# Patient Record
Sex: Male | Born: 1940 | Race: White | Hispanic: No | Smoking: Former smoker
Health system: Southern US, Community
[De-identification: ages and names within clinical notes are randomized; demographics above are authoritative.]

## PROBLEM LIST (undated history)

## (undated) DIAGNOSIS — F419 Anxiety disorder, unspecified: Secondary | ICD-10-CM

## (undated) DIAGNOSIS — J45909 Unspecified asthma, uncomplicated: Secondary | ICD-10-CM

## (undated) DIAGNOSIS — E669 Obesity, unspecified: Secondary | ICD-10-CM

## (undated) DIAGNOSIS — K219 Gastro-esophageal reflux disease without esophagitis: Secondary | ICD-10-CM

## (undated) DIAGNOSIS — K529 Noninfective gastroenteritis and colitis, unspecified: Secondary | ICD-10-CM

## (undated) DIAGNOSIS — I714 Abdominal aortic aneurysm, without rupture, unspecified: Secondary | ICD-10-CM

## (undated) DIAGNOSIS — Z8619 Personal history of other infectious and parasitic diseases: Secondary | ICD-10-CM

## (undated) DIAGNOSIS — H21561 Pupillary abnormality, right eye: Secondary | ICD-10-CM

## (undated) DIAGNOSIS — R0602 Shortness of breath: Secondary | ICD-10-CM

## (undated) DIAGNOSIS — A809 Acute poliomyelitis, unspecified: Secondary | ICD-10-CM

## (undated) DIAGNOSIS — I2510 Atherosclerotic heart disease of native coronary artery without angina pectoris: Secondary | ICD-10-CM

## (undated) DIAGNOSIS — I10 Essential (primary) hypertension: Secondary | ICD-10-CM

## (undated) DIAGNOSIS — T148XXA Other injury of unspecified body region, initial encounter: Secondary | ICD-10-CM

## (undated) DIAGNOSIS — M1990 Unspecified osteoarthritis, unspecified site: Secondary | ICD-10-CM

## (undated) DIAGNOSIS — E785 Hyperlipidemia, unspecified: Secondary | ICD-10-CM

## (undated) HISTORY — PX: CATARACT EXTRACTION: SUR2

## (undated) HISTORY — DX: Anxiety disorder, unspecified: F41.9

## (undated) HISTORY — PX: EYE SURGERY: SHX253

## (undated) HISTORY — DX: Obesity, unspecified: E66.9

## (undated) HISTORY — DX: Essential (primary) hypertension: I10

## (undated) HISTORY — DX: Unspecified asthma, uncomplicated: J45.909

## (undated) HISTORY — PX: KNEE ARTHROSCOPY: SHX127

## (undated) HISTORY — DX: Gastro-esophageal reflux disease without esophagitis: K21.9

## (undated) HISTORY — PX: NASAL SINUS SURGERY: SHX719

## (undated) HISTORY — DX: Noninfective gastroenteritis and colitis, unspecified: K52.9

## (undated) HISTORY — PX: CARDIAC CATHETERIZATION: SHX172

## (undated) HISTORY — DX: Atherosclerotic heart disease of native coronary artery without angina pectoris: I25.10

## (undated) HISTORY — DX: Hyperlipidemia, unspecified: E78.5

## (undated) HISTORY — PX: SHOULDER SURGERY: SHX246

## (undated) HISTORY — DX: Acute poliomyelitis, unspecified: A80.9

---

## 1999-01-16 ENCOUNTER — Encounter: Payer: Self-pay | Admitting: Cardiology

## 1999-01-16 ENCOUNTER — Ambulatory Visit (HOSPITAL_COMMUNITY): Admission: RE | Admit: 1999-01-16 | Discharge: 1999-01-16 | Payer: Self-pay | Admitting: Cardiology

## 1999-03-24 ENCOUNTER — Encounter: Admission: RE | Admit: 1999-03-24 | Discharge: 1999-06-22 | Payer: Self-pay | Admitting: Family Medicine

## 1999-06-15 ENCOUNTER — Ambulatory Visit (HOSPITAL_COMMUNITY): Admission: RE | Admit: 1999-06-15 | Discharge: 1999-06-15 | Payer: Self-pay | Admitting: Orthopedic Surgery

## 1999-06-15 ENCOUNTER — Encounter: Payer: Self-pay | Admitting: Orthopedic Surgery

## 1999-07-01 ENCOUNTER — Encounter: Payer: Self-pay | Admitting: Orthopedic Surgery

## 1999-07-08 ENCOUNTER — Ambulatory Visit (HOSPITAL_COMMUNITY): Admission: RE | Admit: 1999-07-08 | Discharge: 1999-07-08 | Payer: Self-pay | Admitting: Orthopedic Surgery

## 1999-07-08 ENCOUNTER — Encounter: Payer: Self-pay | Admitting: Orthopedic Surgery

## 2001-02-14 ENCOUNTER — Emergency Department (HOSPITAL_COMMUNITY): Admission: EM | Admit: 2001-02-14 | Discharge: 2001-02-15 | Payer: Self-pay | Admitting: Emergency Medicine

## 2002-10-29 ENCOUNTER — Encounter: Admission: RE | Admit: 2002-10-29 | Discharge: 2002-10-29 | Payer: Self-pay | Admitting: Pediatrics

## 2002-10-29 ENCOUNTER — Encounter: Payer: Self-pay | Admitting: Pediatrics

## 2003-08-10 ENCOUNTER — Emergency Department (HOSPITAL_COMMUNITY): Admission: AD | Admit: 2003-08-10 | Discharge: 2003-08-10 | Payer: Self-pay | Admitting: Family Medicine

## 2003-10-21 ENCOUNTER — Emergency Department (HOSPITAL_COMMUNITY): Admission: EM | Admit: 2003-10-21 | Discharge: 2003-10-21 | Payer: Self-pay | Admitting: Family Medicine

## 2003-11-13 ENCOUNTER — Ambulatory Visit (HOSPITAL_COMMUNITY): Admission: RE | Admit: 2003-11-13 | Discharge: 2003-11-13 | Payer: Self-pay | Admitting: Internal Medicine

## 2003-11-14 ENCOUNTER — Ambulatory Visit (HOSPITAL_COMMUNITY): Admission: RE | Admit: 2003-11-14 | Discharge: 2003-11-14 | Payer: Self-pay | Admitting: Internal Medicine

## 2004-01-25 ENCOUNTER — Emergency Department (HOSPITAL_COMMUNITY): Admission: EM | Admit: 2004-01-25 | Discharge: 2004-01-25 | Payer: Self-pay | Admitting: *Deleted

## 2004-03-09 ENCOUNTER — Encounter: Payer: Self-pay | Admitting: Internal Medicine

## 2004-08-26 ENCOUNTER — Ambulatory Visit: Payer: Self-pay | Admitting: Internal Medicine

## 2004-09-03 ENCOUNTER — Ambulatory Visit: Payer: Self-pay | Admitting: Internal Medicine

## 2004-09-08 ENCOUNTER — Ambulatory Visit: Payer: Self-pay | Admitting: Internal Medicine

## 2004-09-15 ENCOUNTER — Ambulatory Visit: Payer: Self-pay | Admitting: Internal Medicine

## 2004-09-25 ENCOUNTER — Ambulatory Visit: Payer: Self-pay | Admitting: Internal Medicine

## 2004-10-01 ENCOUNTER — Ambulatory Visit: Payer: Self-pay | Admitting: Internal Medicine

## 2004-10-07 ENCOUNTER — Ambulatory Visit: Payer: Self-pay | Admitting: Internal Medicine

## 2004-10-08 ENCOUNTER — Ambulatory Visit (HOSPITAL_COMMUNITY): Admission: RE | Admit: 2004-10-08 | Discharge: 2004-10-08 | Payer: Self-pay | Admitting: *Deleted

## 2004-10-16 ENCOUNTER — Ambulatory Visit: Payer: Self-pay | Admitting: Internal Medicine

## 2004-10-21 HISTORY — PX: CORONARY ARTERY BYPASS GRAFT: SHX141

## 2004-10-23 ENCOUNTER — Inpatient Hospital Stay (HOSPITAL_COMMUNITY): Admission: RE | Admit: 2004-10-23 | Discharge: 2004-10-30 | Payer: Self-pay | Admitting: Surgery

## 2004-11-30 ENCOUNTER — Encounter (HOSPITAL_COMMUNITY): Admission: RE | Admit: 2004-11-30 | Discharge: 2005-02-28 | Payer: Self-pay | Admitting: Cardiology

## 2004-12-22 ENCOUNTER — Ambulatory Visit: Payer: Self-pay | Admitting: Internal Medicine

## 2005-05-11 ENCOUNTER — Encounter: Admission: RE | Admit: 2005-05-11 | Discharge: 2005-05-11 | Payer: Self-pay | Admitting: Surgery

## 2005-06-16 ENCOUNTER — Ambulatory Visit: Payer: Self-pay | Admitting: Internal Medicine

## 2005-12-22 ENCOUNTER — Ambulatory Visit: Payer: Self-pay | Admitting: Internal Medicine

## 2006-03-01 ENCOUNTER — Encounter (INDEPENDENT_AMBULATORY_CARE_PROVIDER_SITE_OTHER): Payer: Self-pay | Admitting: Gastroenterology

## 2006-03-01 ENCOUNTER — Ambulatory Visit (HOSPITAL_COMMUNITY): Admission: RE | Admit: 2006-03-01 | Discharge: 2006-03-01 | Payer: Self-pay | Admitting: Gastroenterology

## 2006-06-22 ENCOUNTER — Ambulatory Visit: Payer: Self-pay | Admitting: Internal Medicine

## 2006-09-21 ENCOUNTER — Ambulatory Visit: Payer: Self-pay | Admitting: Internal Medicine

## 2006-11-03 ENCOUNTER — Ambulatory Visit: Payer: Self-pay | Admitting: Internal Medicine

## 2007-05-03 ENCOUNTER — Ambulatory Visit: Payer: Self-pay | Admitting: Internal Medicine

## 2007-07-03 ENCOUNTER — Ambulatory Visit: Payer: Self-pay | Admitting: Internal Medicine

## 2007-07-03 LAB — CONVERTED CEMR LAB: Theophylline Lvl: 0.5 ug/mL — ABNORMAL LOW (ref 10.0–20.0)

## 2007-08-23 ENCOUNTER — Telehealth (INDEPENDENT_AMBULATORY_CARE_PROVIDER_SITE_OTHER): Payer: Self-pay | Admitting: *Deleted

## 2007-08-31 ENCOUNTER — Telehealth (INDEPENDENT_AMBULATORY_CARE_PROVIDER_SITE_OTHER): Payer: Self-pay | Admitting: *Deleted

## 2007-10-20 ENCOUNTER — Emergency Department (HOSPITAL_COMMUNITY): Admission: EM | Admit: 2007-10-20 | Discharge: 2007-10-20 | Payer: Self-pay | Admitting: Emergency Medicine

## 2007-11-16 ENCOUNTER — Telehealth: Payer: Self-pay | Admitting: Internal Medicine

## 2007-12-19 ENCOUNTER — Telehealth (INDEPENDENT_AMBULATORY_CARE_PROVIDER_SITE_OTHER): Payer: Self-pay | Admitting: *Deleted

## 2008-01-01 ENCOUNTER — Ambulatory Visit: Payer: Self-pay | Admitting: Internal Medicine

## 2008-01-01 DIAGNOSIS — J3089 Other allergic rhinitis: Secondary | ICD-10-CM

## 2008-01-01 DIAGNOSIS — K219 Gastro-esophageal reflux disease without esophagitis: Secondary | ICD-10-CM | POA: Insufficient documentation

## 2008-01-01 DIAGNOSIS — J449 Chronic obstructive pulmonary disease, unspecified: Secondary | ICD-10-CM | POA: Insufficient documentation

## 2008-01-01 DIAGNOSIS — J302 Other seasonal allergic rhinitis: Secondary | ICD-10-CM | POA: Insufficient documentation

## 2008-01-01 DIAGNOSIS — J330 Polyp of nasal cavity: Secondary | ICD-10-CM | POA: Insufficient documentation

## 2008-02-01 ENCOUNTER — Encounter: Admission: RE | Admit: 2008-02-01 | Discharge: 2008-02-01 | Payer: Self-pay | Admitting: Internal Medicine

## 2008-02-26 ENCOUNTER — Ambulatory Visit: Payer: Self-pay | Admitting: Vascular Surgery

## 2008-06-06 ENCOUNTER — Ambulatory Visit: Payer: Self-pay | Admitting: Internal Medicine

## 2008-06-20 ENCOUNTER — Ambulatory Visit: Payer: Self-pay | Admitting: Internal Medicine

## 2008-07-04 ENCOUNTER — Ambulatory Visit: Payer: Self-pay | Admitting: Internal Medicine

## 2008-07-25 ENCOUNTER — Telehealth (INDEPENDENT_AMBULATORY_CARE_PROVIDER_SITE_OTHER): Payer: Self-pay | Admitting: *Deleted

## 2008-08-02 ENCOUNTER — Encounter: Payer: Self-pay | Admitting: Internal Medicine

## 2008-10-04 ENCOUNTER — Encounter: Payer: Self-pay | Admitting: Cardiology

## 2008-12-31 ENCOUNTER — Ambulatory Visit: Payer: Self-pay | Admitting: Internal Medicine

## 2008-12-31 LAB — CONVERTED CEMR LAB
Basophils Absolute: 0 10*3/uL (ref 0.0–0.1)
Eosinophils Relative: 10.3 % — ABNORMAL HIGH (ref 0.0–5.0)
HCT: 41.4 % (ref 39.0–52.0)
Lymphocytes Relative: 17.6 % (ref 12.0–46.0)
Lymphs Abs: 1.2 10*3/uL (ref 0.7–4.0)
Monocytes Relative: 9.2 % (ref 3.0–12.0)
Platelets: 188 10*3/uL (ref 150.0–400.0)
RDW: 13.7 % (ref 11.5–14.6)
WBC: 6.7 10*3/uL (ref 4.5–10.5)

## 2009-01-06 ENCOUNTER — Telehealth: Payer: Self-pay | Admitting: Internal Medicine

## 2009-01-06 LAB — CONVERTED CEMR LAB: IgE (Immunoglobulin E), Serum: 230.8 intl units/mL — ABNORMAL HIGH (ref 0.0–180.0)

## 2009-02-18 ENCOUNTER — Ambulatory Visit: Payer: Self-pay | Admitting: Internal Medicine

## 2009-03-04 ENCOUNTER — Ambulatory Visit: Payer: Self-pay | Admitting: Cardiology

## 2009-03-04 DIAGNOSIS — I119 Hypertensive heart disease without heart failure: Secondary | ICD-10-CM | POA: Insufficient documentation

## 2009-03-04 DIAGNOSIS — E785 Hyperlipidemia, unspecified: Secondary | ICD-10-CM | POA: Insufficient documentation

## 2009-03-11 ENCOUNTER — Ambulatory Visit: Payer: Self-pay | Admitting: Internal Medicine

## 2009-03-17 ENCOUNTER — Ambulatory Visit: Payer: Self-pay | Admitting: Internal Medicine

## 2009-03-21 ENCOUNTER — Telehealth: Payer: Self-pay | Admitting: Cardiology

## 2009-03-24 ENCOUNTER — Ambulatory Visit: Payer: Self-pay | Admitting: Internal Medicine

## 2009-03-25 ENCOUNTER — Telehealth (INDEPENDENT_AMBULATORY_CARE_PROVIDER_SITE_OTHER): Payer: Self-pay | Admitting: *Deleted

## 2009-03-27 ENCOUNTER — Ambulatory Visit: Payer: Self-pay | Admitting: Internal Medicine

## 2009-03-31 ENCOUNTER — Telehealth (INDEPENDENT_AMBULATORY_CARE_PROVIDER_SITE_OTHER): Payer: Self-pay | Admitting: *Deleted

## 2009-03-31 ENCOUNTER — Ambulatory Visit: Payer: Self-pay | Admitting: Internal Medicine

## 2009-04-03 ENCOUNTER — Ambulatory Visit: Payer: Self-pay | Admitting: Internal Medicine

## 2009-04-07 ENCOUNTER — Ambulatory Visit: Payer: Self-pay | Admitting: Internal Medicine

## 2009-04-10 ENCOUNTER — Ambulatory Visit: Payer: Self-pay | Admitting: Internal Medicine

## 2009-04-14 ENCOUNTER — Ambulatory Visit: Payer: Self-pay | Admitting: Internal Medicine

## 2009-04-14 ENCOUNTER — Telehealth (INDEPENDENT_AMBULATORY_CARE_PROVIDER_SITE_OTHER): Payer: Self-pay | Admitting: Radiology

## 2009-04-15 ENCOUNTER — Encounter: Payer: Self-pay | Admitting: Cardiovascular Disease

## 2009-04-15 ENCOUNTER — Ambulatory Visit: Payer: Self-pay

## 2009-04-17 ENCOUNTER — Ambulatory Visit: Payer: Self-pay | Admitting: Cardiology

## 2009-04-17 ENCOUNTER — Ambulatory Visit: Payer: Self-pay | Admitting: Internal Medicine

## 2009-04-18 ENCOUNTER — Ambulatory Visit: Payer: Self-pay | Admitting: Internal Medicine

## 2009-04-21 ENCOUNTER — Ambulatory Visit: Payer: Self-pay | Admitting: Internal Medicine

## 2009-04-22 LAB — CONVERTED CEMR LAB
ALT: 23 units/L (ref 0–53)
AST: 19 units/L (ref 0–37)
Albumin: 3.6 g/dL (ref 3.5–5.2)
Cholesterol: 167 mg/dL (ref 0–200)
HDL: 34.2 mg/dL — ABNORMAL LOW (ref >39.00)
Total Bilirubin: 1 mg/dL (ref 0.3–1.2)
Total Protein: 7.2 g/dL (ref 6.0–8.3)
Triglycerides: 87 mg/dL (ref 0.0–149.0)
VLDL: 17.4 mg/dL (ref 0.0–40.0)

## 2009-04-24 ENCOUNTER — Ambulatory Visit: Payer: Self-pay | Admitting: Internal Medicine

## 2009-05-01 ENCOUNTER — Ambulatory Visit: Payer: Self-pay | Admitting: Internal Medicine

## 2009-05-08 ENCOUNTER — Ambulatory Visit: Payer: Self-pay | Admitting: Internal Medicine

## 2009-05-09 ENCOUNTER — Ambulatory Visit: Payer: Self-pay | Admitting: Internal Medicine

## 2009-05-15 ENCOUNTER — Ambulatory Visit: Payer: Self-pay | Admitting: Internal Medicine

## 2009-05-22 ENCOUNTER — Ambulatory Visit: Payer: Self-pay | Admitting: Internal Medicine

## 2009-05-27 ENCOUNTER — Ambulatory Visit: Payer: Self-pay | Admitting: Internal Medicine

## 2009-05-28 ENCOUNTER — Ambulatory Visit: Payer: Self-pay | Admitting: Internal Medicine

## 2009-06-06 ENCOUNTER — Ambulatory Visit: Payer: Self-pay | Admitting: Internal Medicine

## 2009-06-10 ENCOUNTER — Ambulatory Visit: Payer: Self-pay | Admitting: Internal Medicine

## 2009-06-13 ENCOUNTER — Ambulatory Visit: Payer: Self-pay | Admitting: Internal Medicine

## 2009-06-14 ENCOUNTER — Emergency Department (HOSPITAL_COMMUNITY): Admission: EM | Admit: 2009-06-14 | Discharge: 2009-06-14 | Payer: Self-pay | Admitting: Emergency Medicine

## 2009-06-14 ENCOUNTER — Encounter (INDEPENDENT_AMBULATORY_CARE_PROVIDER_SITE_OTHER): Payer: Self-pay | Admitting: Emergency Medicine

## 2009-06-14 ENCOUNTER — Ambulatory Visit: Payer: Self-pay | Admitting: Vascular Surgery

## 2009-06-20 ENCOUNTER — Telehealth: Payer: Self-pay | Admitting: Internal Medicine

## 2009-06-24 ENCOUNTER — Ambulatory Visit: Payer: Self-pay | Admitting: Internal Medicine

## 2009-06-30 ENCOUNTER — Telehealth: Payer: Self-pay | Admitting: Internal Medicine

## 2009-07-01 ENCOUNTER — Telehealth (INDEPENDENT_AMBULATORY_CARE_PROVIDER_SITE_OTHER): Payer: Self-pay | Admitting: *Deleted

## 2009-07-01 ENCOUNTER — Ambulatory Visit: Payer: Self-pay | Admitting: Internal Medicine

## 2009-07-14 ENCOUNTER — Ambulatory Visit: Payer: Self-pay | Admitting: Internal Medicine

## 2009-07-18 ENCOUNTER — Ambulatory Visit: Payer: Self-pay | Admitting: Internal Medicine

## 2009-07-25 ENCOUNTER — Ambulatory Visit: Payer: Self-pay | Admitting: Internal Medicine

## 2009-08-04 ENCOUNTER — Ambulatory Visit: Payer: Self-pay | Admitting: Internal Medicine

## 2009-08-23 HISTORY — PX: LUMBAR DISC SURGERY: SHX700

## 2009-08-25 ENCOUNTER — Ambulatory Visit: Payer: Self-pay | Admitting: Internal Medicine

## 2009-08-28 ENCOUNTER — Ambulatory Visit: Payer: Self-pay | Admitting: Internal Medicine

## 2009-08-29 ENCOUNTER — Ambulatory Visit: Payer: Self-pay | Admitting: Internal Medicine

## 2009-09-08 ENCOUNTER — Ambulatory Visit: Payer: Self-pay | Admitting: Internal Medicine

## 2009-09-19 ENCOUNTER — Ambulatory Visit: Payer: Self-pay | Admitting: Internal Medicine

## 2009-10-07 ENCOUNTER — Ambulatory Visit: Payer: Self-pay | Admitting: Internal Medicine

## 2009-10-15 ENCOUNTER — Telehealth (INDEPENDENT_AMBULATORY_CARE_PROVIDER_SITE_OTHER): Payer: Self-pay | Admitting: *Deleted

## 2009-10-15 ENCOUNTER — Ambulatory Visit: Payer: Self-pay | Admitting: Internal Medicine

## 2009-10-20 ENCOUNTER — Ambulatory Visit: Payer: Self-pay | Admitting: Internal Medicine

## 2009-10-20 LAB — CONVERTED CEMR LAB
Calcium: 9.2 mg/dL (ref 8.4–10.5)
GFR calc non Af Amer: 88.71 mL/min (ref >60)
Glucose, Bld: 83 mg/dL (ref 70–99)
Potassium: 4.2 meq/L (ref 3.5–5.1)
Sodium: 141 meq/L (ref 135–145)

## 2009-11-03 ENCOUNTER — Ambulatory Visit: Payer: Self-pay | Admitting: Internal Medicine

## 2009-11-21 ENCOUNTER — Ambulatory Visit: Payer: Self-pay | Admitting: Internal Medicine

## 2009-12-02 ENCOUNTER — Ambulatory Visit: Payer: Self-pay | Admitting: Internal Medicine

## 2009-12-10 ENCOUNTER — Ambulatory Visit: Payer: Self-pay | Admitting: Internal Medicine

## 2009-12-24 ENCOUNTER — Ambulatory Visit: Payer: Self-pay | Admitting: Internal Medicine

## 2010-01-12 ENCOUNTER — Ambulatory Visit: Payer: Self-pay | Admitting: Internal Medicine

## 2010-01-21 ENCOUNTER — Ambulatory Visit: Payer: Self-pay | Admitting: Internal Medicine

## 2010-01-29 ENCOUNTER — Ambulatory Visit: Payer: Self-pay | Admitting: Internal Medicine

## 2010-02-12 ENCOUNTER — Ambulatory Visit: Payer: Self-pay | Admitting: Internal Medicine

## 2010-02-17 ENCOUNTER — Ambulatory Visit: Payer: Self-pay | Admitting: Internal Medicine

## 2010-02-21 ENCOUNTER — Encounter: Payer: Self-pay | Admitting: Internal Medicine

## 2010-02-26 ENCOUNTER — Ambulatory Visit: Payer: Self-pay | Admitting: Internal Medicine

## 2010-03-01 ENCOUNTER — Observation Stay (HOSPITAL_COMMUNITY): Admission: EM | Admit: 2010-03-01 | Discharge: 2010-03-02 | Payer: Self-pay | Admitting: Emergency Medicine

## 2010-03-01 ENCOUNTER — Ambulatory Visit: Payer: Self-pay | Admitting: Cardiology

## 2010-03-05 ENCOUNTER — Ambulatory Visit: Payer: Self-pay | Admitting: Internal Medicine

## 2010-03-23 ENCOUNTER — Ambulatory Visit: Payer: Self-pay | Admitting: Internal Medicine

## 2010-03-30 ENCOUNTER — Ambulatory Visit: Payer: Self-pay | Admitting: Internal Medicine

## 2010-04-13 ENCOUNTER — Ambulatory Visit: Payer: Self-pay | Admitting: Internal Medicine

## 2010-04-15 ENCOUNTER — Telehealth: Payer: Self-pay | Admitting: Cardiology

## 2010-05-11 ENCOUNTER — Emergency Department (HOSPITAL_COMMUNITY): Admission: EM | Admit: 2010-05-11 | Discharge: 2010-05-11 | Payer: Self-pay | Admitting: Emergency Medicine

## 2010-05-25 ENCOUNTER — Observation Stay (HOSPITAL_COMMUNITY): Admission: EM | Admit: 2010-05-25 | Discharge: 2010-05-25 | Payer: Self-pay | Admitting: Emergency Medicine

## 2010-06-17 ENCOUNTER — Ambulatory Visit (HOSPITAL_COMMUNITY): Admission: RE | Admit: 2010-06-17 | Discharge: 2010-06-18 | Payer: Self-pay | Admitting: Neurological Surgery

## 2010-09-22 NOTE — Assessment & Plan Note (Signed)
Summary: rov 4 months ///kp   Primary Provider/Referring Provider:  Kirby Funk  CC:  Follow up visit-asthma and allergies; no complaints since on dulera..  History of Present Illness: 05/21/09- Asthma, allergic rhinitis, GERD We reviewed together- CXR NAD, IgE elevated, Eos elevated. Allergy vaccine started. Theophylline making him nauseated- discussed splitting, taking after meals and  trying for different generic manufacturer. Still wheezing a lot at night.Most nights has to wake and use rescue inhaler. We discussed his experience with long acting bronchodilator. Out of Flovent past week.   October 20, 2009- Asthma, allergic rhinitis, GERD With [facility] he finds he is doing much better, needing rescue inhaler only rarely, where it had been twice daily. He had requested permission to give his own allergy shots because of cost. Otherwise he felt unable to continue this therapy. Eyes itching some x 2 weeks. We talked carefully about risk, safety, anaphyllaxis, policy and recommendation, Epipen. He indicated good understanding. He had rash twice with different theophylines and stopped them.  February 17, 2010- Asthma, allergic rhinitis, GERD Has reached maintenance on his allergy vaccine, continuing to get shots here with no problems. Reviewed his meds. He very much likes [facility] which he credits for improved stability. Has continued prednisone 5 mg every other day. Overall he is doing very much better than last year and we discussed getting off the prednisone.        Asthma History    Initial Asthma Severity Rating:    Age range: 12+ years    Symptoms: 0-2 days/week    Nighttime Awakenings: 0-2/month    Interferes w/ normal activity: no limitations    SABA use (not for EIB): 0-2 days/week    Asthma Severity Assessment: Intermittent   Preventive Screening-Counseling & Management  Alcohol-Tobacco     Smoking Status: quit     Year Quit: 1994  Current Medications (verified): 1)   Proair Hfa 108 (90 Base) Mcg/act Aers (Albuterol Sulfate) .... 2 Puffs Four Times A Day As Needed 2)  Fexofenadine Hcl 180 Mg  Tabs (Fexofenadine Hcl) .... Take 1 Tablet By Mouth Once A Day 3)  Ipratropium-Albuterol 0.5-2.5 (3) Mg/45ml Soln (Ipratropium-Albuterol) .... Use 1 Vial Four Times A Day As Needed 4)  Prednisone 5 Mg Tabs (Prednisone) .Marland Kitchen.. 1 By Mouth Once Every Other Day 5)  Dulera 100-5 Mcg/act Aero (Mometasone Furo-Formoterol Fum) .... 2 Puffs and Rinse, Twice Daily 6)  Allergy Vaccine 1:50 Go (W-E) 7)  Epipen 0.3 Mg/0.17ml Devi (Epinephrine) .... For Severe Allergic Reaction 8)  Zantac 150 Mg Tabs (Ranitidine Hcl) .... Take 1 Tablet By Mouth Once A Day As Needed 9)  Dipentum 250 Mg Caps (Olsalazine Sodium) .... Take 1 Capsule By Mouth Once A Day 10)  Aspir-Low 81 Mg Tbec (Aspirin) .... Take 1 Tablet By Mouth Once A Day 11)  Crestor 5 Mg Tabs (Rosuvastatin Calcium) .... Take 1 By Mouth On M,w, F  Allergies (verified): 1)  ! Codeine 2)  ! Pcn 3)  ! Sulfa 4)  ! Ibuprofen 5)  ! Asa 6)  ! Keflex 7)  ! * Statins 8)  ! * Advair 9)  ! * Singulair 10)  ! * Pravastatin  Past History:  Past Medical History: Last updated: 03/04/2009 1. Allergic Rhinitis 2. Allergic asthma: aspirin causes bronchospasm.  PFTs (6/10): FVC 78%, FEV1 67%, ratio 0.6, TLC 103%, DLCO 71%.  Mild obstructive defect improved with bronchodilators.  3. Polio- left leg 4. 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  EF 55-60% on cath at that time.  6. Hyperlipidemia 7. HTN 8. Prior tobacco use, quit 1994 9. Colitis 10.  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    Vital Signs:  Patient profile:   70 year old male Height:      68.5 inches Weight:      223 pounds BMI:     33.53 O2 Sat:      96 % on Room air Pulse rate:   78 / minute BP sitting:   138 / 82  (left arm) Cuff size:   regular  Vitals Entered By: Reynaldo Minium CMA (February 17, 2010 9:05 AM)  O2 Flow:  Room air CC: Follow up visit-asthma and allergies; no complaints since on dulera.   Physical Exam  Additional Exam:  General: A/Ox3; pleasant and cooperative, NAD, overweight SKIN: no rash, lesions NODES: no lymphadenopathy HEENT: [place]/AT, EOM- WNL, Conjuctivae- clear, chronic weakness/ droop Right eyelid, PERRLA, TM-WNL, Nose- clear, Throat- clear and wnl, dentures, Mallampati  III-IV NECK: Supple w/ fair ROM, JVD- none, normal carotid impulses w/o bruits Thyroid-, no stridor CHEST: clear but slow expiration, unlabored without rhonchi or dullness HEART: RRR, no m/g/r heard ABDOMEN: soft, over weight ZOX:WRUE, nl pulses, no edema  NEURO: Grossly intact to observation      Impression & Recommendations:  Problem # 1:  ASTHMA (ICD-493.90) Much  beter control of chronic obstructive asthma. We can continue present meds as discussed with him. Elwin Sleight is refilled He has continued prednisone every other day. He will drop  off now and see how he does.  Problem # 2:  ALLERGIC RHINITIS (ICD-477.9)  He is much more comfortable. We will continue allergy vaccine. Discussed advancing dose to 1:10. His updated medication list for this problem includes:    Fexofenadine Hcl 180 Mg Tabs (Fexofenadine hcl) .Marland Kitchen... 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 He continues to avoid aspirin.  Medications Added to Medication List This Visit: 1)  Allergy Vaccine 1:10 Go (w-e)  .... Advance with next order 2)  Crestor 5 Mg Tabs (Rosuvastatin calcium) .... Take 1 by mouth on m,w, f  Other Orders: Est. Patient Level IV (45409) Prescription Created Electronically [phone])  Patient Instructions: 1)  Please schedule a follow-up appointment in 4 months. 2)  I will have the allergy lab increase your vaccine strength next time it is refilled. 3)  Dulera 100/5 was refilled at your drug store Prescriptions: DULERA 100-5 MCG/ACT AERO (MOMETASONE FURO-FORMOTEROL FUM) 2 puffs and rinse, twice daily  #1 x prn   Entered and Authorized by:   Waymon Budge  MD   Signed by:   Waymon Budge MD on 02/17/2010   Method used:   Electronically to        [facility] [address] [phone]* (retail)       [address]       [place], [place]  [address]       Ph: [phone] or [phone]       Fax: [phone]   RxID:   [id number]

## 2010-09-22 NOTE — Miscellaneous (Signed)
Summary: Injection Record / [facility]    Injection Record / [facility]    Imported By: Lennie Odor 01/29/2010 17:21:47  _____________________________________________________________________  External Attachment:    Type:   Image     Comment:   External Document

## 2010-09-22 NOTE — Progress Notes (Signed)
Summary: ALLERGY  Phone Note Other Incoming   Caller: Pt.came in for all.shot. Details for Reason: Ins. increased his shots from $15 to $25 a wk.. Summary of Call: Thomas [name] came in today and said he is going to have to stop his allergy shots. He is currently building up on 1:50(.2 today). He said he would finish this set and then stop because he just can't afford $100.00 a month.($125 5 wk months) I know we normally discourage pt.'s giving their own shots. They seem to be helping him. His mind is sharp,he still works. Can we Make an acception? He won't be back til  next wk. just let me know whenever you get a chance. Initial call taken by: Thomas [name],  October 15, 2009 3:27 PM  Follow-up for Phone Call        I had talked with him about this before. We can train him to give his own shots, provide and teach epipen, sign waiver. I've changed the medication list to reflect his giving his own. Follow-up by: Waymon Budge MD,  October 16, 2009 12:02 PM    New/Updated Medications: * ALLERGY VACCINE 1:50 GO (W-E)

## 2010-09-22 NOTE — Miscellaneous (Signed)
Summary: Injection Record / [facility]    Injection Record / [facility]    Imported By: Lennie Odor 05/13/2010 11:43:22  _____________________________________________________________________  External Attachment:    Type:   Image     Comment:   External Document

## 2010-09-22 NOTE — Miscellaneous (Signed)
Summary: Rx/[facility]  Rx/[facility]   Imported By: Sherian Rein 03/17/2010 10:16:03  _____________________________________________________________________  External Attachment:    Type:   Image     Comment:   External Document

## 2010-09-22 NOTE — Progress Notes (Signed)
Summary: appointments for myoview and Dr Shirlee Latch  ---- Converted from flag ---- ---- 04/15/2010 1:01 PM, [employer], CMA wrote: Wanted to let you know that Mr. Thomas [name] has cancelled twice for his Nuclear study which was originally sched by Dr. Shirlee Latch for 03/09/10 after being seen on 03/01/10 in the ER. He did not resched. this last time which was suppoed to be 04/20/10.   Thanks, Burna Mortimer ------------------------------ I called and talked with pt today. He did not want to reschedule any appointments right now. He stated he was going out of town for about 6 weeks and would call and reschedule  when he returns.  He denied any chest pain or discomfort.

## 2010-09-22 NOTE — Miscellaneous (Signed)
Summary: Injection Orders / [facility]    Injection Orders / [facility]    Imported By: Lennie Odor 01/20/2010 15:41:52  _____________________________________________________________________  External Attachment:    Type:   Image     Comment:   External Document

## 2010-09-22 NOTE — Assessment & Plan Note (Signed)
Summary: 4 months/apc   Primary Provider/Referring Provider:  Dr. Sherin Quarry  CC:  4 month follow up.  states breathing has improved a lot and wheezing has resolved since starting dulera.  States he rarely needs to use provental now.  c/o eyes itching occasionally over the past 2 wks.  .  History of Present Illness: 03/11/09- Asthma, allergic rhinitis, GERD 4 days incr cough brown, without fever, sore throat or sick exposure. We reviewed his allergy skin test results from 2005 per his question  Pending cardiac stress test. Added Lisinopril for BP.We discussed cough, angioedema and hives as side effects to watch for. PFT- Mild obstruction with response to dilator, airtrapping, diffusion reduced. CXR IgE CBC  05/21/09- Asthma, allergic rhinitis, GERD We reviewed together- CXR NAD, IgE elevated, Eos elevated. Allergy vaccine started. Theophylline making him nauseated- discussed splitting, taking after meals and  trying for different generic manufacturer. Still wheezing a lot at night.Most nights has to wake and use rescue inhaler. We discussed his experience with long acting bronchodilator. Out of Flovent past week.   October 20, 2009- Asthma, allergic rhinitis, GERD With [facility] he finds he is doing much better, needing rescue inhaler only rarely, where it had been twice daily. He had requested permission to give his own allergy shots because of cost. Otherwise he felt unable to continue this therapy. Eyes itching some x 2 weeks. We talked carefully about risk, safety, anaphyllaxis, policy and recommendation, Epipen. He indicated good understanding. He had rash twice with different theophylines and stopped them.   Current Medications (verified): 1)  Proventil Hfa 108 (90 Base) Mcg/act Aers (Albuterol Sulfate) .... 2 Puffs Four Times A Day As Needed 2)  Fexofenadine Hcl 180 Mg  Tabs (Fexofenadine Hcl) .... Take 1 Tablet By Mouth Once A Day 3)  Ipratropium-Albuterol 0.5-2.5 (3) Mg/59ml Soln  (Ipratropium-Albuterol) .... Use 1 Vial Four Times A Day As Needed 4)  Prednisone 5 Mg Tabs (Prednisone) .Marland Kitchen.. 1 By Mouth Once Daily 5)  Allergy Vaccine 1:50 Go (W-E) 6)  Zantac 150 Mg Tabs (Ranitidine Hcl) .... Take 1 Tablet By Mouth Once A Day As Needed 7)  Dipentum 250 Mg Caps (Olsalazine Sodium) .... Take 1 Capsule By Mouth Once A Day 8)  Aspir-Low 81 Mg Tbec (Aspirin) .... Take 1 Tablet By Mouth Once A Day 9)  Dulera 200-5 Mcg/act Aero (Mometasone Furo-Formoterol Fum) .... 2 Puffs and Rinse Twice Daily  Allergies (verified): 1)  ! Codeine 2)  ! Pcn 3)  ! Sulfa 4)  ! Ibuprofen 5)  ! Asa 6)  ! Keflex 7)  ! * Statins 8)  ! * Advair 9)  ! * Singulair 10)  ! * Pravastatin  Past History:  Past Medical History: Last updated: 03/04/2009 1. Allergic Rhinitis 2. Allergic asthma: aspirin causes bronchospasm.  PFTs (6/10): FVC 78%, FEV1 67%, ratio 0.6, TLC 103%, DLCO 71%.  Mild obstructive defect improved with bronchodilators.  3. Polio- left leg 4. 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  EF 55-60% on cath at that time.  6. Hyperlipidemia 7. HTN 8. Prior tobacco use, quit 1994 9. Colitis 10.  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    Vital Signs:  Patient profile:   70 year old male Height:      68.5 inches Weight:      224.13 pounds BMI:     33.70 O2 Sat:      96 % on Room air Pulse rate:   63 / minute BP sitting:   134 / 86  (left arm) Cuff size:   regular  Vitals Entered By: Gweneth Dimitri RN (October 20, 2009 9:36 AM)  O2 Flow:  Room air CC: 4 month follow up.  states breathing has improved a lot and wheezing has resolved since starting dulera.  States he rarely needs to use provental now.  c/o eyes itching occasionally over the past 2 wks.   Comments Medications reviewed with patient Daytime contact number verified with patient. Gweneth Dimitri RN  October 20, 2009 9:36 AM    Physical Exam  Additional Exam:  General: A/Ox3; pleasant and cooperative, NAD, overweight SKIN: no rash, lesions NODES: no lymphadenopathy HEENT: [place]/AT, EOM- WNL, Conjuctivae- clear, chronic weakness/ droop Right eyelid, PERRLA, TM-WNL, Nose- clear, Throat- clear and wnl NECK: Supple w/ fair ROM, JVD- none, normal carotid impulses w/o bruits Thyroid- CHEST: clear but slow expiration, unlabored without rhonchi or dullness HEART: RRR, no m/g/r heard ABDOMEN: soft [id number], nl pulses, no edema  NEURO: Grossly intact to observation      Impression & Recommendations:  Problem # 1:  ALLERGIC RHINITIS (ICD-477.9)  After careful discussion, he is being given permission to give his own allergy shots as discussed. His updated medication list for this problem includes:    Fexofenadine Hcl 180 Mg Tabs (Fexofenadine hcl) .Marland Kitchen... Take 1 tablet by mouth once a day  Problem # 2:  ASTHMA (ICD-493.90) We will see if Dulera 100/5 is sufficient. Reduce prednisone to every other day.  Medications Added to Medication List This Visit: 1)  Prednisone 5 Mg Tabs (Prednisone) .Marland Kitchen.. 1 by mouth once every  other day 2)  Dulera 100-5 Mcg/act Aero (Mometasone furo-formoterol fum) .... 2 puffs and rinse, twice daily 3)  Epipen 0.3 Mg/0.48ml Devi (Epinephrine) .... For severe allergic reaction 4)  Zantac 150 Mg Tabs (Ranitidine hcl) .... Take 1 tablet by mouth once a day as needed  Other Orders: Est. Patient Level III (47829) TLB-BMP (Basic Metabolic Panel-BMET) (80048-METABOL)  Patient Instructions: 1)  Please schedule a follow-up appointment in 4 months. 2)  Sample and script to change Dulera to 100/5 strength. if it doesn't hold you well, we will go back to the 200/5 strength. 3)  For leg cramps, try eating more potassium rich foods- bannanas ,tomatoes, orange juice, spinach. Stretch a little before bed. 4)  Script for epipen to keep near allergy supplies in case of severe allergic reaction 5)  Lab Prescriptions: PREDNISONE 5 MG TABS (PREDNISONE) 1 by mouth once every other day  #30 x prn   Entered and Authorized by:   Waymon Budge MD   Signed by:   Waymon Budge MD on 10/20/2009   Method used:   Print then Give to Patient   RxID:  [id number] EPIPEN 0.3 MG/0.3ML DEVI (EPINEPHRINE) For severe allergic reaction  #1 x prn   Entered and Authorized by:   Waymon Budge MD   Signed by:   Waymon Budge MD on 10/20/2009   Method used:   Print then Give to Patient   RxID:   [id number] DULERA 100-5 MCG/ACT AERO (MOMETASONE FURO-FORMOTEROL FUM) 2 puffs and rinse, twice daily  #1 x prn   Entered and Authorized by:   Waymon Budge MD   Signed by:   Waymon Budge MD on 10/20/2009   Method used:   Print then Give to Patient   RxID:   [id number]    Immunization History:  Pneumovax Immunization History:    Pneumovax:  historical (05/24/2007)

## 2010-10-22 ENCOUNTER — Ambulatory Visit (INDEPENDENT_AMBULATORY_CARE_PROVIDER_SITE_OTHER): Payer: Medicare Other | Admitting: Internal Medicine

## 2010-10-22 ENCOUNTER — Encounter: Payer: Self-pay | Admitting: Internal Medicine

## 2010-10-22 DIAGNOSIS — J309 Allergic rhinitis, unspecified: Secondary | ICD-10-CM

## 2010-10-22 DIAGNOSIS — J45909 Unspecified asthma, uncomplicated: Secondary | ICD-10-CM

## 2010-10-29 NOTE — Assessment & Plan Note (Signed)
Summary: rov ///kp   Primary Provider/Referring Provider:  Kirby Funk  CC:  Follow up visit-discuss allergy vaccine.Marland Kitchen  History of Present Illness: October 20, 2009- Asthma, allergic rhinitis, GERD With [facility] he finds he is doing much better, needing rescue inhaler only rarely, where it had been twice daily. He had requested permission to give his own allergy shots because of cost. Otherwise he felt unable to continue this therapy. Eyes itching some x 2 weeks. We talked carefully about risk, safety, anaphyllaxis, policy and recommendation, Epipen. He indicated good understanding. He had rash twice with different theophylines and stopped them.  February 17, 2010- Asthma, allergic rhinitis, GERD Has reached maintenance on his allergy vaccine, continuing to get shots here with no problems. Reviewed his meds. He very much likes [facility] which he credits for improved stability. Has continued prednisone 5 mg every other day. Overall he is doing very much better than last year and we discussed getting off the prednisone.  October 22, 2010- Asthma, allergic rhinitis, GERD Nurse-CC: Follow up visit-discuss allergy vaccine. Hosp cardiology 03/02/10- for noncardiac pain in context of diffuse CAD. Had lumbar disk surgery in October with prolonged bed rest so he dropped allergy shots. He wants to see how he does now off allergy vaccine. Daily Allegra 180. Now 2-3 days increased cough with green sputum, no fever or sore throat.        Asthma History    Asthma Control Assessment:    Age range: 12+ years    Symptoms: 0-2 days/week    Nighttime Awakenings: 0-2/month    Interferes w/ normal activity: no limitations    SABA use (not for EIB): 0-2 days/week    Asthma Control Assessment: Well Controlled   Preventive Screening-Counseling & Management  Alcohol-Tobacco     Smoking Status: quit     Year Quit: 1994  Current Medications (verified): 1)  Proair Hfa 108 (90 Base) Mcg/act Aers (Albuterol  Sulfate) .... 2 Puffs Four Times A Day As Needed 2)  Fexofenadine Hcl 180 Mg  Tabs (Fexofenadine Hcl) .... Take 1 Tablet By Mouth Once A Day 3)  Ipratropium-Albuterol 0.5-2.5 (3) Mg/71ml Soln (Ipratropium-Albuterol) .... Use 1 Vial Four Times A Day As Needed 4)  Dulera 100-5 Mcg/act Aero (Mometasone Furo-Formoterol Fum) .... 2 Puffs and Rinse, Twice Daily 5)  Allergy Vaccine 1:10 Go (W-E) .... Advance With Next Order 6)  Tagamet Hb 200 Mg Tabs (Cimetidine) .... As Needed 7)  Dipentum 250 Mg Caps (Olsalazine Sodium) .... Take 1 Capsule By Mouth Once A Day 8)  Aspir-Low 81 Mg Tbec (Aspirin) .... Take 1 Tablet By Mouth Once A Day 9)  Crestor 5 Mg Tabs (Rosuvastatin Calcium) .... Take 1 By Mouth On M,w, F  Allergies (verified): 1)  ! Codeine 2)  ! Pcn 3)  ! Sulfa 4)  ! Ibuprofen 5)  ! Asa 6)  ! Keflex 7)  ! * Statins 8)  ! * Advair 9)  ! * Singulair 10)  ! * Pravastatin  Past History:  Past Medical History: Last updated: 03/04/2009 1. Allergic Rhinitis 2. Allergic asthma: aspirin causes bronchospasm.  PFTs (6/10): FVC 78%, FEV1 67%, ratio 0.6, TLC 103%, DLCO 71%.  Mild obstructive defect improved with bronchodilators.  3. Polio- left leg 4. 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  EF 55-60% on cath at that time.  6. Hyperlipidemia 7. HTN 8. Prior tobacco use, quit 1994 9. Colitis 10.  Obesity  Family History: Last updated: 12/31/2008 Father-  died cerebral hemorrhage Mother died Alzheimers Sister- died- liver failure Sister- died cerebral hemorrhage sister died cerebral hemorrhage broter- died lung cancer- smoked  Social History: Last updated: 03/04/2009 Patient states former smoker-1994 Married, lives in [place] Worked- [employer], now drives shuttle for [employer]  Risk Factors: Smoking Status: quit (10/22/2010)  Past Surgical History: C A B G: Arthroscopy right k nee Surgery right eye- cataract, rebult eye  socket Sinus surgery Lumbar disk 2011  Review of Systems      See HPI       The patient complains of productive cough and nasal congestion/difficulty breathing through nose.  The patient denies shortness of breath with activity, shortness of breath at rest, non-productive cough, coughing up blood, chest pain, irregular heartbeats, acid heartburn, indigestion, loss of appetite, weight change, abdominal pain, difficulty swallowing, sore throat, tooth/dental problems, headaches, and sneezing.    Vital Signs:  Patient profile:   70 year old male Height:      68.5 inches Weight:      217.50 pounds BMI:     32.71 O2 Sat:      95 % on Room air Pulse rate:   78 / minute BP sitting:   142 / 92  (right arm) Cuff size:   regular  Vitals Entered By: Reynaldo Minium CMA (October 22, 2010 4:18 PM)  O2 Flow:  Room air CC: Follow up visit-discuss allergy vaccine.   Physical Exam  Additional Exam:  General: A/Ox3; pleasant and cooperative, NAD, overweight SKIN: no rash, lesions NODES: no lymphadenopathy HEENT: [place]/AT, EOM- WNL, Conjuctivae- clear, chronic weakness/ droop Right eyelid, PERRLA, TM-WNL, Nose- clear, Throat- clear and wnl, dentures, Mallampati  III-IV NECK: Supple w/ fair ROM, JVD- none, normal carotid impulses w/o bruits Thyroid-, no stridor CHEST: bibasilar wheezes, unlabored.  HEART: RRR, no m/g/r heard ABDOMEN: soft, over weight ZOX:WRUE, nl pulses, no edema  NEURO: Grossly intact to observation      Impression & Recommendations:  Problem # 1:  ASTHMA (ICD-493.90) He credits Dulera with stopping waking from asthma. He asks about doing without it and we discussed doing that cautiously.  He has an acute bronchitis now and with discussion is given Z pak.   Problem # 2:  ALLERGIC RHINITIS (ICD-477.9)  We will watch now off allergy vaccine and he will use the fexofenadine as discussed.  His updated medication list for this problem includes:    Fexofenadine Hcl 180 Mg Tabs  (Fexofenadine hcl) .Marland Kitchen... Take 1 tablet by mouth once a day  Medications Added to Medication List This Visit: 1)  Tagamet Hb 200 Mg Tabs (Cimetidine) .... As needed 2)  Zithromax Z-pak 250 Mg Tabs (Azithromycin) .... 2 today then one daily  Other Orders: Est. Patient Level III (45409)  Patient Instructions: 1)  Please schedule a follow-up appointment in 4 months. 2)  Refill scripts for neb solution and Proair printed 3)  Ok to stay off allergy  vaccine and see how you do 4)  Script for Z pak Prescriptions: ZITHROMAX Z-PAK 250 MG TABS (AZITHROMYCIN) 2 today then one daily  #1 pak x 0   Entered and Authorized by:   Waymon Budge MD   Signed by:   Waymon Budge MD on 10/22/2010   Method used:   Print then Give to Patient   RxID:   [id number] IPRATROPIUM-ALBUTEROL 0.5-2.5 (3) MG/3ML SOLN (IPRATROPIUM-ALBUTEROL) Use 1 vial four times a day as needed  #50 x prn   Entered and Authorized  by:   Waymon Budge MD   Signed by:   Waymon Budge MD on 10/22/2010   Method used:   Print then Give to Patient   RxID:   [id number] PROAIR HFA 108 (90 BASE) MCG/ACT AERS (ALBUTEROL SULFATE) 2 puffs four times a day as needed  #8.5 Inhaler x prn   Entered and Authorized by:   Waymon Budge MD   Signed by:   Waymon Budge MD on 10/22/2010   Method used:   Print then Give to Patient   RxID:   [id number]

## 2010-11-04 LAB — CBC
HCT: 41.8 % (ref 39.0–52.0)
HCT: 42.2 % (ref 39.0–52.0)
Hemoglobin: 14.6 g/dL (ref 13.0–17.0)
Hemoglobin: 14.7 g/dL (ref 13.0–17.0)
MCHC: 34.9 g/dL (ref 30.0–36.0)
MCV: 88.9 fL (ref 78.0–100.0)
RBC: 4.69 MIL/uL (ref 4.22–5.81)
RDW: 13.5 % (ref 11.5–15.5)

## 2010-11-04 LAB — BASIC METABOLIC PANEL
BUN: 10 mg/dL (ref 6–23)
CO2: 24 mEq/L (ref 19–32)
Calcium: 8.8 mg/dL (ref 8.4–10.5)
Chloride: 108 mEq/L (ref 96–112)
GFR calc Af Amer: >60 mL/min (ref >60)
GFR calc Af Amer: >60 mL/min (ref >60)
GFR calc non Af Amer: >60 mL/min (ref >60)
GFR calc non Af Amer: >60 mL/min (ref >60)
Glucose, Bld: 135 mg/dL — ABNORMAL HIGH (ref 70–99)
Potassium: 3.8 mEq/L (ref 3.5–5.1)
Potassium: 4.4 mEq/L (ref 3.5–5.1)
Sodium: 138 mEq/L (ref 135–145)
Sodium: 140 mEq/L (ref 135–145)

## 2010-11-04 LAB — DIFFERENTIAL
Basophils Absolute: 0 10*3/uL (ref 0.0–0.1)
Basophils Relative: 1 % (ref 0–1)
Eosinophils Relative: 8 % — ABNORMAL HIGH (ref 0–5)
Lymphocytes Relative: 9 % — ABNORMAL LOW (ref 12–46)
Lymphs Abs: 0.8 10*3/uL (ref 0.7–4.0)
Monocytes Absolute: 0.2 10*3/uL (ref 0.1–1.0)
Monocytes Absolute: 1.1 10*3/uL — ABNORMAL HIGH (ref 0.1–1.0)
Monocytes Relative: 14 % — ABNORMAL HIGH (ref 3–12)
Monocytes Relative: 2 % — ABNORMAL LOW (ref 3–12)
Neutro Abs: 4.4 10*3/uL (ref 1.7–7.7)
Neutro Abs: 8.6 10*3/uL — ABNORMAL HIGH (ref 1.7–7.7)
Neutrophils Relative %: 89 % — ABNORMAL HIGH (ref 43–77)

## 2010-11-04 LAB — SURGICAL PCR SCREEN: Staphylococcus aureus: POSITIVE — AB

## 2010-11-04 LAB — PROTIME-INR
INR: 1 (ref 0.00–1.49)
Prothrombin Time: 13.4 seconds (ref 11.6–15.2)

## 2010-11-04 LAB — APTT
aPTT: 27 seconds (ref 24–37)
aPTT: 31 seconds (ref 24–37)

## 2010-11-05 LAB — DIFFERENTIAL
Basophils Relative: 0 % (ref 0–1)
Lymphs Abs: 1.1 10*3/uL (ref 0.7–4.0)
Monocytes Relative: 6 % (ref 3–12)
Neutro Abs: 5.7 10*3/uL (ref 1.7–7.7)
Neutrophils Relative %: 75 % (ref 43–77)

## 2010-11-05 LAB — COMPREHENSIVE METABOLIC PANEL
Alkaline Phosphatase: 57 U/L (ref 39–117)
BUN: 13 mg/dL (ref 6–23)
Calcium: 9 mg/dL (ref 8.4–10.5)
GFR calc Af Amer: >60 mL/min (ref >60)
GFR calc non Af Amer: >60 mL/min (ref >60)
Glucose, Bld: 118 mg/dL — ABNORMAL HIGH (ref 70–99)
Total Protein: 7.4 g/dL (ref 6.0–8.3)

## 2010-11-05 LAB — CBC
Hemoglobin: 14.6 g/dL (ref 13.0–17.0)
MCH: 32.5 pg (ref 26.0–34.0)
Platelets: 208 10*3/uL (ref 150–400)
RBC: 4.48 MIL/uL (ref 4.22–5.81)
WBC: 7.6 10*3/uL (ref 4.0–10.5)

## 2010-11-08 LAB — HEMOCCULT GUIAC POC 1CARD (OFFICE): Fecal Occult Bld: NEGATIVE

## 2010-11-08 LAB — AMYLASE
Amylase: 44 U/L (ref 0–105)
Amylase: 48 U/L (ref 0–105)

## 2010-11-08 LAB — CK TOTAL AND CKMB (NOT AT ARMC)
CK, MB: 2.4 ng/mL (ref 0.3–4.0)
Relative Index: INVALID (ref 0.0–2.5)
Total CK: 96 U/L (ref 7–232)

## 2010-11-08 LAB — COMPREHENSIVE METABOLIC PANEL
ALT: 34 U/L (ref 0–53)
AST: 56 U/L — ABNORMAL HIGH (ref 0–37)
Albumin: 3.2 g/dL — ABNORMAL LOW (ref 3.5–5.2)
Albumin: 3.7 g/dL (ref 3.5–5.2)
Alkaline Phosphatase: 49 U/L (ref 39–117)
Alkaline Phosphatase: 66 U/L (ref 39–117)
BUN: 11 mg/dL (ref 6–23)
Calcium: 8.5 mg/dL (ref 8.4–10.5)
Calcium: 9.3 mg/dL (ref 8.4–10.5)
GFR calc Af Amer: >60 mL/min (ref >60)
Glucose, Bld: 123 mg/dL — ABNORMAL HIGH (ref 70–99)
Glucose, Bld: 98 mg/dL (ref 70–99)
Potassium: 4 mEq/L (ref 3.5–5.1)
Potassium: 4 mEq/L (ref 3.5–5.1)
Sodium: 140 mEq/L (ref 135–145)
Total Protein: 6.3 g/dL (ref 6.0–8.3)
Total Protein: 6.9 g/dL (ref 6.0–8.3)

## 2010-11-08 LAB — CARDIAC PANEL(CRET KIN+CKTOT+MB+TROPI)
CK, MB: 1.9 ng/mL (ref 0.3–4.0)
CK, MB: 2.2 ng/mL (ref 0.3–4.0)
CK, MB: 2.3 ng/mL (ref 0.3–4.0)
Relative Index: INVALID (ref 0.0–2.5)
Relative Index: INVALID (ref 0.0–2.5)
Relative Index: INVALID (ref 0.0–2.5)
Total CK: 70 U/L (ref 7–232)
Total CK: 85 U/L (ref 7–232)
Total CK: 89 U/L (ref 7–232)
Troponin I: 0.01 ng/mL (ref 0.00–0.06)
Troponin I: 0.01 ng/mL (ref 0.00–0.06)
Troponin I: 0.01 ng/mL (ref 0.00–0.06)

## 2010-11-08 LAB — COMPREHENSIVE METABOLIC PANEL WITH GFR
ALT: 34 U/L (ref 0–53)
AST: 33 U/L (ref 0–37)
BUN: 13 mg/dL (ref 6–23)
CO2: 24 meq/L (ref 19–32)
CO2: 26 meq/L (ref 19–32)
Chloride: 108 meq/L (ref 96–112)
Chloride: 110 meq/L (ref 96–112)
Creatinine, Ser: 0.84 mg/dL (ref 0.4–1.5)
Creatinine, Ser: 0.94 mg/dL (ref 0.4–1.5)
GFR calc Af Amer: >60 mL/min (ref >60)
GFR calc non Af Amer: >60 mL/min (ref >60)
GFR calc non Af Amer: >60 mL/min (ref >60)
Sodium: 140 meq/L (ref 135–145)
Total Bilirubin: 0.8 mg/dL (ref 0.3–1.2)
Total Bilirubin: 0.9 mg/dL (ref 0.3–1.2)

## 2010-11-08 LAB — URINALYSIS, ROUTINE W REFLEX MICROSCOPIC
Bilirubin Urine: NEGATIVE
Glucose, UA: NEGATIVE mg/dL
Hgb urine dipstick: NEGATIVE
Ketones, ur: NEGATIVE mg/dL
Nitrite: NEGATIVE
Protein, ur: NEGATIVE mg/dL
Specific Gravity, Urine: 1.02 (ref 1.005–1.030)
Urobilinogen, UA: 0.2 mg/dL (ref 0.0–1.0)
pH: 5 (ref 5.0–8.0)

## 2010-11-08 LAB — PROTIME-INR
INR: 1.03 (ref 0.00–1.49)
Prothrombin Time: 13.4 seconds (ref 11.6–15.2)

## 2010-11-08 LAB — CBC
HCT: 42.7 % (ref 39.0–52.0)
Hemoglobin: 14.6 g/dL (ref 13.0–17.0)
MCH: 32.4 pg (ref 26.0–34.0)
MCHC: 34.2 g/dL (ref 30.0–36.0)
MCV: 94.9 fL (ref 78.0–100.0)
Platelets: 175 10*3/uL (ref 150–400)
RBC: 4.5 MIL/uL (ref 4.22–5.81)
RDW: 14.1 % (ref 11.5–15.5)
WBC: 7.4 10*3/uL (ref 4.0–10.5)

## 2010-11-08 LAB — DIFFERENTIAL
Basophils Absolute: 0 K/uL (ref 0.0–0.1)
Basophils Relative: 0 % (ref 0–1)
Eosinophils Absolute: 0.2 10*3/uL (ref 0.0–0.7)
Eosinophils Relative: 3 % (ref 0–5)
Lymphocytes Relative: 12 % (ref 12–46)
Lymphs Abs: 0.9 10*3/uL (ref 0.7–4.0)
Monocytes Absolute: 0.6 10*3/uL (ref 0.1–1.0)
Monocytes Relative: 9 % (ref 3–12)
Neutro Abs: 5.6 K/uL (ref 1.7–7.7)
Neutrophils Relative %: 76 % (ref 43–77)

## 2010-11-08 LAB — POCT CARDIAC MARKERS
CKMB, poc: 1.6 ng/mL (ref 1.0–8.0)
Myoglobin, poc: 87.2 ng/mL (ref 12–200)
Troponin i, poc: <0.05 ng/mL (ref 0.00–0.09)

## 2010-11-08 LAB — LIPASE, BLOOD
Lipase: 27 U/L (ref 11–59)
Lipase: 31 U/L (ref 11–59)

## 2010-11-08 LAB — APTT: aPTT: 29 s (ref 24–37)

## 2010-11-08 LAB — TROPONIN I: Troponin I: 0.01 ng/mL (ref 0.00–0.06)

## 2010-12-11 ENCOUNTER — Telehealth: Payer: Self-pay | Admitting: Cardiology

## 2010-12-11 NOTE — Telephone Encounter (Signed)
Ok to leave message -pt thinks he's due for stress test-is he and what kind?

## 2010-12-11 NOTE — Telephone Encounter (Signed)
Patient was discharged in July 2011 and was supposed to f/u with Dr. Shirlee Latch and have a stress myoview in August 2011 but had back surgery. He is calling to day to schedule this. I have scheduled him to f/u with Dr. Shirlee Latch on 12/29/10 @ 10:45 am.

## 2010-12-25 ENCOUNTER — Encounter: Payer: Self-pay | Admitting: Cardiology

## 2010-12-29 ENCOUNTER — Ambulatory Visit (INDEPENDENT_AMBULATORY_CARE_PROVIDER_SITE_OTHER): Payer: MEDICARE | Admitting: Cardiology

## 2010-12-29 DIAGNOSIS — R079 Chest pain, unspecified: Secondary | ICD-10-CM

## 2010-12-29 DIAGNOSIS — I25810 Atherosclerosis of coronary artery bypass graft(s) without angina pectoris: Secondary | ICD-10-CM

## 2010-12-29 DIAGNOSIS — R0609 Other forms of dyspnea: Secondary | ICD-10-CM

## 2010-12-29 DIAGNOSIS — R0602 Shortness of breath: Secondary | ICD-10-CM

## 2010-12-29 DIAGNOSIS — I10 Essential (primary) hypertension: Secondary | ICD-10-CM

## 2010-12-29 DIAGNOSIS — I2510 Atherosclerotic heart disease of native coronary artery without angina pectoris: Secondary | ICD-10-CM

## 2010-12-29 DIAGNOSIS — R0989 Other specified symptoms and signs involving the circulatory and respiratory systems: Secondary | ICD-10-CM

## 2010-12-29 DIAGNOSIS — E785 Hyperlipidemia, unspecified: Secondary | ICD-10-CM

## 2010-12-29 MED ORDER — LISINOPRIL 10 MG PO TABS: 10.0000 mg | ORAL_TABLET | Freq: Every day | ORAL | Status: DC

## 2010-12-29 MED ORDER — NIACIN ER (ANTIHYPERLIPIDEMIC) 500 MG PO TBCR: EXTENDED_RELEASE_TABLET | ORAL | Status: DC

## 2010-12-29 NOTE — Patient Instructions (Addendum)
Increase lisinopril to 10mg  daily.  Take and record your blood pressure. I will call you in 2 weeks to get the readings. Thomas [name]   [phone]  Start Niaspan 500mg  at bedtime. Take this about 30 minutes after you take your aspirin. If you tolerate this dose increase to 1000mg  at bedtime. This will be two 500mg  at bedtime.  Schedule an appointment for a lexiscan  Myoview. See Instruction sheet.  Schedule an appointment for an echocardiogram.  Schedule an appointment for lab in 2 weeks--BMP/BNP   Schedule an appointment for fasting lab in 2 months--lipid profile/liver profile  [phone]   786.50  Your physician wants you to follow-up in: 6 months with Dr Shirlee Latch. (November 2012) You will receive a reminder letter in the mail two months in advance. If you don't receive a letter, please call our office to schedule the follow-up appointment.

## 2010-12-31 ENCOUNTER — Encounter: Payer: Self-pay | Admitting: Cardiology

## 2010-12-31 NOTE — Assessment & Plan Note (Signed)
Status post CABG.  Continue ASA and ACEI.  Has not tolerated statins and not on beta blocker given asthma.

## 2010-12-31 NOTE — Assessment & Plan Note (Signed)
He has tried multiple statins now, getting myalgias with each. I will have him try Niaspan.  Start with 500 mg qhs x 1 week (30 minutes after aspirin), then increase to 1000 mg daily.  Lipids/LFTs in 2 months.

## 2010-12-31 NOTE — Assessment & Plan Note (Signed)
BP is running high.  Increase lisinopril to 10 mg daily with BMET and BP check in 2 wks.    If myoview and echo are normal, followup in 6 months.

## 2010-12-31 NOTE — Assessment & Plan Note (Signed)
Patient has exertional dyspnea that seems to be relatively new.  He was admitted in 7/11 with chest pain and advised to get a stress test at that time but did not followup.  He has had no further significant chest pain.  It is possible that the exertional dyspnea could be due to deconditioning and weight gain after back surgery in 10/11.  However, given his history of CABG, I think that risk stratification with ETT-myoview would be reasonable.  I will also have him get an echocardiogram.

## 2010-12-31 NOTE — Progress Notes (Signed)
PCP: Dr. Valentina Lucks  70 yo with history of allergic asthma and CAD s/p CABG presents for cardiology followup.  I last saw him about 2 years ago.  At that time, he had developed some exertional shortness of breath.  ETT-myoview showed no ischemia or infarction.  Since that time, he was admitted overnight in 7/11 with atypical chest pain.  He ruled out for MI and was sent home but did not followup in the office.  He had surgery for a herniated disc in 10/11 with no cardiac complications.  He has not had any further significant chest pain but feels like he is getting more short of breath with exertion again.  He is short of breath after walking about 2 blocks on flat ground or climbing 2 flights of steps.  This has been present for a couple of months.  He gets some tingling in the right side of his neck on occasion.  This is not exertional.  I started him on pravastatin when I saw him in 2010 but he did not tolerate this due to myalgias.  Finally, his BP is high today (172/86).  Systolic BP when he checks it himself has ranged in the 130s-140s.   ECG:  NSR, normal  Labs (2/10): HDL 37, LDL 107, TG 167, K 3.6, creatinine 0.9  Allergies:  1)  ! Codeine 2)  ! Pcn 3)  ! Sulfa 4)  ! Ibuprofen 5)  ! Asa 6)  ! Keflex 7)  ! * Statins 8)  ! * Advair 9)  ! * Singulair  Past Medical History: 1. Allergic Rhinitis 2. Allergic asthma: aspirin causes bronchospasm.  PFTs (6/10): FVC 78%, FEV1 67%, ratio 0.6, TLC 103%, DLCO 71%.  Mild obstructive defect improved with bronchodilators.  Sees Dr. Maple Hudson.  3. Polio- left leg 4. 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  EF 55-60% on cath at that time.  ETT-myoview (8/10) with EF 64%, no evidence for ischemia or infarction.  6. Hyperlipidemia: myalgias with Crestor, atorvastatin, pravastatin 7. HTN 8. Prior tobacco use, quit 1994 9. Colitis 10.  Obesity 11.  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   Current Outpatient Prescriptions  Medication Sig Dispense Refill  . albuterol (PROAIR HFA) 108 (90 BASE) MCG/ACT inhaler Inhale 2 puffs into the lungs every 6 (six) hours as needed.        Marland Kitchen aspirin 81 MG tablet Take 81 mg by mouth daily.        . cimetidine (TAGAMET) 200 MG tablet Take 200 mg by mouth 4 (four) times daily as needed.        . fexofenadine (ALLEGRA) 180 MG tablet Take 180 mg by mouth daily.        Marland Kitchen ipratropium-albuterol (DUONEB) 0.5-2.5 (3) MG/3ML SOLN Take 3 mLs by nebulization every 6 (six) hours as needed.        . Mometasone Furo-Formoterol Fum (DULERA) 100-5 MCG/ACT AERO Inhale 2 puffs into the lungs 2 (two) times daily.        Marland Kitchen olsalazine (DIPENTUM) 250 MG capsule Take 500 mg by mouth daily.        Marland Kitchen  predniSONE (DELTASONE) 5 MG tablet Take 5 mg by mouth as needed.        Marland Kitchen lisinopril (PRINIVIL,ZESTRIL) 10 MG tablet Take 1 tablet (10 mg total) by mouth daily.  30 tablet  6  . niacin (NIASPAN) 500 MG CR tablet Take one tablet at night about 30 minutes after you take aspirin. If you tolerate this, increase to two tablets at night 30 minutes after you take aspirin.  60 tablet  6    BP 172/86  Resp 18  Ht 5\' 8"  (1.727 m)  Wt 214 lb (97.07 kg)  BMI 32.54 kg/m2 General: NAD, obese Neck: No JVD, no thyromegaly or thyroid nodule.  Lungs: Clear to auscultation bilaterally with normal respiratory effort. CV: Nondisplaced PMI.  Heart regular S1/S2, no S3/S4, no murmur.  1+ edema 1/2 up lower legs bilaterally.  No carotid bruit.  Normal pedal pulses.  Abdomen: Soft, nontender, no hepatosplenomegaly, no  distention.  Neurologic: Alert and oriented x 3.  Psych: Normal affect. Extremities: No clubbing or cyanosis.

## 2011-01-05 NOTE — Assessment & Plan Note (Signed)
[facility]                             PULMONARY OFFICE NOTE   NAME:Diviney, SEIJI WISWELL                    MRN:          [id number]  DATE:05/03/2007                            DOB:          Jul 17, 1941    PROBLEMS:  1. Allergic asthma.  2. Allergic rhinitis.  3. History of nasal polyps (aspirin triad).  4. Esophageal reflux.  5. Coronary disease/bypass graft.   HISTORY:  He maintains on prednisone 5 mg daily.  Occasional heartburn.  Last night he had to wake up and use his metered inhaler, then his  nebulizer.  Throat felt tight.  Onset was sudden and woke him from  sleep.  Has been coughing more nonproductively in the last 2 months and  asks for a chest x-ray.  Also would like to retry Spiriva, and to change  Symbicort to Flovent 220 alone because it would be cheaper.  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  No edema, adenopathy or purulent  discharge.   MEDICATIONS:  1. Theo-Dur 200 mg once daily.  2. Prednisone 5 mg daily.  3. Symbicort 160.  4. Fexofenadine 180 mg.  5. Dipentum 4 times 250 mg.  6. Rescue albuterol inhaler.  7. Home nebulizer with DuoNeb.  8. Zantac.   DRUG INTOLERANCE OF ADVAIR WITH RASH, SINGULAIR, PENICILLIN, CODEINE,  SULFA, IBUPROFEN, ASPIRIN.   OBJECTIVE:  Weight 219 pounds, BP 168/88, pulse 77, room air saturation  97%.  Inspiratory and expiratory wheezing.  HEART:  Sounds regular without murmur.  No neck vein distention.  No postnasal drainage or edema.   IMPRESSION:  Exacerbation of asthma with bronchitis.  I suspect he had a  reflux episode that woke him so suddenly in the night.  We discussed  reflux precautions.   PLAN:  Chest x-ray.  Refill Spiriva.  Stop Symbicort.  Resume Flovent  220 two puffs b.i.d. with careful mouth rinse while continuing  prednisone 5 mg daily, if he can tolerate the combination.  Scheduled  return 2  months, earlier p.r.n.     Clinton D. Maple Hudson, MD, Tonny Bollman, FACP  Electronically Signed    CDY/MedQ  DD: 05/07/2007  DT: 05/08/2007  Job #: [id number]   cc:   Tasia Catchings, M.D.  Cassell Clement, M.D.

## 2011-01-05 NOTE — Consult Note (Signed)
VASCULAR SURGERY CONSULTATION   Thomas [name], Thomas [name]  DOB:  19-Apr-1941                                       02/26/2008  ZOXWR#:[id number]   The patient was referred for vascular surgery evaluation by Dr. Sherin Quarry  for venous insufficiency of the right leg.  This 70 year old gentleman  states that he has been having treating discomfort in the right calf and  the sole of his right foot since early June.  He has no history of deep  venous thrombosis, thrombophlebitis or other venous problems, and has  been able to walk up to 1 mile at a time but has had problems with the  sole of his foot the past.  He was treated for fasciitis by Dr.  Simonne Come in the past.  He has had no bleeding varicosities or stasis  ulcers in the past and had a venous duplex performed at [facility] June 6th of this year which revealed no deep venous thrombosis.  He has not worn elastic compression stockings and takes occasional pain  medication.   PAST MEDICAL HISTORY:  Positive for coronary artery disease and asthma.  Negative for diabetes, stroke, hypertension, hyperlipidemia, COPD.   FAMILY HISTORY:  Positive for varicose veins in his sister.   SOCIAL HISTORY:  Negative for tobacco and alcohol.   ALLERGIES:  Include codeine, penicillin, sulfa drugs, Keflex, ibuprofen,  Ceftin, Zetia, Lipitor, Crestor.   PAST SURGICAL HISTORY:  1. Three eye operations in 1969, 1974, and 1985.  2. Sinus surgery in 1996.  3. Knee operation in 1998.  4. Coronary artery bypass grafting ion 2006 with removal of both      saphenous veins.   PHYSICAL EXAM:  Blood pressure 124/80, heart rate 80, respirations 14.  General:  He is a male patient no apparent distress, alert and oriented  x3.  Neck is supple, 3+ carotid pulses palpable.  No bruits are audible.  Neurologic:  Normal.  No palpable adenopathy in the neck.  Chest:  Clear  to auscultation.  Cardiovascular:  Regular rhythm with no murmurs.  Abdomen:  Soft, nontender with no palpable masses.  Has 3+ femoral and  dorsalis pedis pulses bilaterally.  Right leg is slightly larger than  the left and the left is slightly shorter both due to polio as a child.  There is no significant edema distally and no significant varicosities.  He does have some prominent spider veins and reticular veins in both  legs.  There is no hyperpigmentation or ulceration.   Venous duplex exam reveals the right greater saphenous vein has been  completely removed with no residual accessory branches causing  hypertension.  There is no evidence and reflux and the deep system  reveals no evidence of deep venous thrombosis with mild deep reflux.   I have reassured him that the symptoms are not coming from venous  disease.  He may well have some recurrent plantar fasciitis and may need  further evaluation by Dr. Rosanne Ashing Aplington if these symptoms persist, but  he has no significant arterial or venous insufficiency at this time.   Quita Skye Hart [place], M.D.  Electronically Signed  JDL/MEDQ  D:  02/26/2008  T:  02/27/2008  Job:  [id number]   cc:   Tasia Catchings, M.D.

## 2011-01-05 NOTE — Assessment & Plan Note (Signed)
[facility]                             PULMONARY OFFICE NOTE   NAME:Clover, DECLYN DELSOL                    MRN:          [id number]  DATE:07/03/2007                            DOB:          [birth year]    PROBLEMS:  1. Allergic asthma.  2. Allergic rhinitis.  3. History of nasal polyps (aspirin triad).  4. Esophageal reflux.  5. Coronary disease/bypass graft.   HISTORY:  Feels he is doing very well now.  Symbicort was changed to  Flovent 220 two puffs b.i.d. along with prednisone 5 mg and he says this  works fine.  We discussed steroid therapy again and he is going to try  to reduce the prednisone.  Spiriva was no help and was stopped.  Daily  fexofenadine 180 mg has been a big help, and we talked about his  theophylline.  Has had flu vaccine.   MEDICATIONS:  1. Aspirin 81 mg.  2. Theophylline 200 mg once daily.  3. Prednisone 5 mg.  4. Fexofenadine 180 mg.  5. Dipentium 250 mg.  6. Flovent 220 inhaler 2 puffs b.i.d.  7. Proventil HFA rescue inhaler.  8. Zantac.   Drug intolerant to ADVAIR, caused rash, SINGULAIR, PENICILLIN, CODEINE,  SULFA, IBUPROFEN, ASPIRIN.   IMPRESSION:  Currently good control of his asthma with bronchitis and  rhinitis.   PLAN:  1. Theophylline level.  2. Try reducing prednisone to take 5 mg only on Monday, Wednesday,      Friday.  3. Schedule return 6 months, earlier p.r.n.     Clinton D. Maple Hudson, MD, Tonny Bollman, FACP  Electronically Signed   CDY/MedQ  DD: 07/03/2007  DT: 07/04/2007  Job #: [id number]   cc:   Tasia Catchings, M.D.  Francisca December, M.D.

## 2011-01-05 NOTE — Procedures (Signed)
LOWER EXTREMITY VENOUS REFLUX EXAM   INDICATION:  Right leg pain and swelling.   EXAM:  Using color-flow imaging and pulse Doppler spectral analysis, the  right femoral, superficial femoral, popliteal, posterior tibial, greater  and lesser saphenous veins are evaluated.  There is evidence suggesting  mild deep venous insufficiency in the right lower extremity at the right  common femoral vein level.   The right saphenofemoral junction is competent.  The right GSV has  previously been removed with no greater saphenous vein branching noted  beyond the proximal thigh level.   The right proximal short saphenous vein demonstrates competency.   GSV Diameter (used if found to be incompetent only)                                            Right    Left  Proximal Greater Saphenous Vein           cm       cm  Proximal-to-mid-thigh                     cm       cm  Mid thigh                                 cm       cm  Mid-distal thigh                          cm       cm  Distal thigh                              cm       cm  Knee                                      cm       cm   IMPRESSION:  1. No right greater saphenous vein reflux is identified due to the      greater saphenous vein being previously removed.  2. The deep venous system is not competent, as described above.  3. The right lesser saphenous vein is competent.   ___________________________________________  Quita Skye. Hart [place], M.D.   CH/MEDQ  D:  02/26/2008  T:  02/26/2008  Job:  [id number]

## 2011-01-08 NOTE — Assessment & Plan Note (Signed)
 HEALTHCARE                               PULMONARY OFFICE NOTE   NAME:Bartling, JOEVON HOLLIMAN                    MRN:          [id number]  DATE:06/22/2006                            DOB:          [birth year]    PROBLEM LIST:  1. Allergic asthma.  2. Allergic rhinitis.  3. History of nasal polyps.  4. Esophageal reflux.  5. Coronary disease/bypass graft.   HISTORY:  He reports some degree of daily wheeze over the last two months  despite being on prednisone 5 mg daily.  Occasional heartburn episodes he  treats with Zantac.  He does not recognize sinus discomfort or postnasal  drainage and he has not recognized specific exposure triggers other than  blaming the weather.  He has had flu and Pneumococcal vaccines.   MEDICATIONS:  1. Aspirin continues at 81 mg, although we have felt that he met criteria      in the past for triad of nasal polyps, asthma and aspirin sensitivity.  2. Theophylline 200 mg x1 daily.  3. Flovent 220 2 puffs b.i.d.  4. Prednisone 5 mg daily.  5. Crestor 5 mg three times a week.  6. Allegra 180 mg use p.r.n.  7. Albuterol rescue inhaler rarely used with home nebulizer DuoNeb rarely      used.  8. Claritin.   DRUG INTOLERANT:  1. ADVAIR with rash (?).  2. SINGULAIR.  3. PENICILLIN.  4. CODEINE.  5. SULFA.  6. IBUPROFEN.   OBJECTIVE:  Weight 217 pounds.  BP 124/86, pulse regular 67, room air  saturation 97%.  There is minimal wheeze.  He seems comfortable.  Heart  sounds are regular without murmur.  No edema.  No visible drainage.   IMPRESSION:  Mild exacerbation the summer of his chronic asthma/chronic  obstructive pulmonary disease pattern.  I realize on review that I need to  spend some time clarifying the significance of his aspirin triad history and  we may want to explore whether we can come off of his 81 mg aspirin for a  while to assess the effect on symptoms.   PLAN:  1. Pulmonary function tests.  2. Try  changing Flovent to Symbicort 160/4.5 with 2 puffs b.i.d.  3. Schedule return in four months, earlier p.r.n.     Clinton D. Maple Hudson, MD, Tonny Bollman, FACP  Electronically Signed    CDY/MedQ  DD: 06/24/2006  DT: 06/25/2006  Job #: [id number]   cc:   Tasia Catchings, M.D.  Cassell Clement, M.D.

## 2011-01-08 NOTE — Assessment & Plan Note (Signed)
[facility]                             PULMONARY OFFICE NOTE   NAME:Thomas [name], Thomas [name]                    MRN:          [id number]  DATE:11/03/2006                            DOB:          1941/04/18    PROBLEM:  1. Allergic asthma.  2. Allergic rhinitis.  3. History of nasal polyps.  4. Esophageal reflux.  5. Coronary disease/bypass graft.   HISTORY:  This gentleman with Afrin triad asthma, nasal polyps and  aspirin sensitivity has had chronic low-grade wheeze which he notices  more at night; some days are worse than others.  He is aware of  occasional heartburn which he can prevent by taking Zantac before  triggering foods but he does not notice heartburn or significant reflux  at night.  He uses his nebulizer occasionally, not daily, and his  metered inhaler the same.  He does not wake at night with any sensation  of choking.  So far, he has not recognized significant spring pollen  rhinitis.  He has continued aspirin at 81 mg daily and I am going to ask  him to stop that for 2 weeks for a trial.  He continues theophylline 200  mg just once daily.  Flovent 220 was replaced with Symbicort 160/4.5 two  puffs b.i.d.  He continues prednisone 5 mg daily, Crestor 5 mg three  days a week,   INCOMPLETE     Clinton D. Maple Hudson, MD, FCCP, FACP     CDY/MedQ  DD: 11/03/2006  DT: 11/04/2006  Job #: [id number]   cc:   Cassell Clement, M.D.  Tasia Catchings, M.D.

## 2011-01-08 NOTE — Op Note (Signed)
Thomas [name], Thomas [name]             ACCOUNT NO.:  [id number]   MEDICAL RECORD NO.:  [id number]          PATIENT TYPE:  INP   LOCATION:  2304                         FACILITY:  [facility]   PHYSICIAN:  Evelene Croon, M.D.     DATE OF BIRTH:  Apr 15, 1941   DATE OF PROCEDURE:  10/23/2004  DATE OF DISCHARGE:                                 OPERATIVE REPORT   PREOPERATIVE DIAGNOSES:  Severe three-vessel coronary artery disease.   POSTOPERATIVE DIAGNOSES:  Severe three-vessel coronary artery disease.   OPERATION PERFORMED:  Median sternotomy, extracorporeal circulation,  coronary artery bypass grafting surgery times four using a left internal  mammary artery  graft to the left anterior descending coronary artery, with  a saphenous vein graft to the intermediate coronary artery, a saphenous vein  graft to the obtuse marginal branch of the left circumflex coronary artery,  and a saphenous vein graft to the posterior descending branch of the right  coronary artery.  Endoscopic vein harvesting from both legs.   SURGEON:  Alleen Borne, M.D.   ASSISTANT:  Toribio Harbour, N.P.   ANESTHESIA:  General endotracheal.   INDICATIONS FOR PROCEDURE:  The patient is a 70 year old gentleman who  presents with about a one-year history of mild substernal chest pain and  pressure with exertion and mild dyspnea.  He had a stress Cardiolite exam on  September 28, 2004 which showed inferior and lateral ST depression.  He had  mild chest pain and pressure during a procedure.  There was inferior and  lateral ST depression.  The nuclear images showed small reversible inferior  apical defect.  His left ventricular ejection fraction was about 58% with  mild inferior apical hypokinesis.  He subsequently underwent cardiac  catheterization on October 08, 2004 which showed significant three vessel  disease.  The left main had mild mid and distal calcification with a distal  10 to 20% narrowing.  The LAD had proximal  calcification and haziness with a  long mid 60 to 70% stenosis.  There was a first diagonal or intermediate  vessel that came off in this area.  The left circumflex was nondominant and  had about 60% ostial stenosis with a single large marginal branch that had  80 to 90% stenosis  The right coronary artery was a dominant vessel with  diffuse proximal 70% stenosis followed by mid 70% stenosis.  There was a  distal 50 to 60% stenosis.  The posterior descending artery had a mid 80%  stenosis.  Left ventricular ejection fraction was 55 to 60% with no wall  motion abnormalities.  After review of the angiogram and examination of the  patient, it was felt that coronary artery bypass graft surgery was the best  treatment.  I discussed the operative procedure with the patient and his  wife including alternatives, benefits, and risks including bleeding, blood  transfusion, infection, stroke, myocardial infarction, graft failure and  death.  They understood and agreed to proceed.   DESCRIPTION OF PROCEDURE:  The patient was taken to the operating room and  placed on the table in supine position.  After  induction of general  endotracheal anesthesia, a Foley catheter was placed in the bladder using  sterile technique.  Then the chest, abdomen and both lower extremities were  prepped and draped in the usual sterile manner.  The chest was entered  through a median sternotomy incision and the pericardium opened in the  midline.  Examination of the heart showed good ventricular contractility.  The ascending aorta had no palpable plaques in it.   Then the left internal mammary artery was harvested from the chest wall as a  pedicle graft.  This was a medium caliber vessel with excellent blood flow  through it.  At the same time, a segment of greater saphenous vein was  harvested from the right thigh using endoscopic vein harvest technique.  This vein was of medium caliber and good quality.  Below the knee,  this vein  became very small and unsuitable and therefore another section of greater  saphenous vein was harvested from the left thigh using endoscopic vein  harvest technique.  This vein was also of medium size and good quality.   Then the patient was heparinized and when an adequate activated clotting  time was achieved, the distal ascending aorta was cannulated using a 20  French aortic cannula for arterial inflow.  Venous outflow was achieved  using a two-stage venous cannula for the right atrial appendage.  An  antegrade cardioplegia and vent cannula was inserted in the aortic root.   The patient was placed on cardiopulmonary bypass and the distal coronary  arteries identified.  The LAD was an intramyocardial vessel along most of  its extent.  It exited to the surface of the heart near the apex where it  was large enough to graft.  The high diagonal branch was severely diseased  proximally with calcific plaque.  It divided into two subbranches, one of  which was intramyocardial and the other on the surface of the heart and was  a medium sized vessel that was graftable.  The obtuse marginal coronary  artery was moderately diseased proximally but was graftable beyond this in  the midportion.  The right coronary artery was diffusely diseased.  It gave  off a small to moderate sized posterior descending branch which was  graftable.  The posterolateral branch was a small 1 mm vessel that was lying  beneath a large vein and was not suitable for grafting.   Then the aorta was cross clamped and 500 cc of cold blood antegrade  cardioplegia was administered in the aortic root with quick arrest of the  heart.  Systemic hypothermia to 20 degrees centigrade and topical  hypothermia with iced saline was used.  A temperature probe was placed on  the septum and insulating pad in the pericardium.  The first distal anastomosis was performed to the obtuse marginal coronary  artery.  The internal  diameter of this vessel was about 1.75 mm.  The  conduit used was a segment of greater saphenous vein and the anastomosis  performed in an end-to-side manner using continuous 7-0 Prolene suture.  The  flow was measured through the graft and was excellent.   The second distal anastomosis was performed to the optional diagonal branch.  The internal diameter of this vessel was about 1.5 mm.  The conduit used was  a second segment of the greater saphenous vein and the anastomosis performed  in end-to-side manner using continuous 7-0 Prolene suture.  Flow was  measured through the graft and was excellent.  Then a dose of cardioplegia  was given down the vein grafts and in the aortic root.   The third distal anastomosis was performed to the posterior descending  coronary artery.  The internal diameter of this vessel was about 1.6 mm.  The conduit used was a third segment of greater saphenous vein and the  anastomosis performed in an end-to-side manner using continuous 7-0 Prolene  suture.  Flow was measured through the graft and was excellent.   The fourth distal anastomosis was performed to the distal LAD.  The internal  diameter of this vessel was about 1.75 mm.  The conduit used was the left  internal mammary graft and this was brought through an opening in the left  pericardium anterior to the phrenic nerve.  It was anastomosed to the LAD in  an end-to-side manner using continuous 8-0 Prolene suture.  The pedicle was  tacked to the epicardium with 6-0 Prolene sutures.  The patient was rewarmed  to 37 degrees centigrade.  With the cross-clamp in place, the three proximal  vein graft anastomoses were performed to the aortic root in an end-to-side  manner using continuous 6-0 Prolene suture.  Then the patient was rewarmed  to 37 degrees centigrade and the clamp removed from the mammary pedicle.  There was rapid warming of the ventricular septum and return of spontaneous  ventricular  fibrillation.  The crossclamp was removed with time of 87  minutes and the patient defibrillated into sinus rhythm.   The proximal and distal anastomoses appeared hemostatic and the line of the  graft satisfactory.  Graft markers were placed around the proximal  anastomoses.  Two temporary right ventricular and right atrial pacing wires  placed and brought out through the skin.   When the patient had rewarmed to 37 degrees centigrade, he was weaned from  cardiopulmonary bypass on no inotropic agents.  Total bypass time was 105  minutes.  Protamine was given and the venous and aortic cannulas were  removed without difficulty.  Hemostasis was achieved.  The patient was given  10 units of platelets due to thrombocytopenia with a platelet count of  50,000.  Three chest tubes were placed with a tube in the posterior  pericardium, one in the left pleural space and one in the anterior mediastinum.  The pericardium was easily approximated over the heart.  The  sternum was closed with #6 stainless steel wires.  The fascia was closed  with continuous #1 Vicryl suture.  The subcutaneous tissue was closed with  continuous 2-0 Vicryl and the skin with 3-0 Vicryl subcuticular closure.  The lower extremity vein harvest site was closed in layers in a similar  manner.  Sponge, needle and instrument counts were correct according to the  scrub nurse.  Dry sterile dressings were applied over the incisions, around  the chest tubes which were hooked to Pleur-evac suction.  The patient  remained hemodynamically stable and was transported to the SICU in guarded  but stable condition.      BB/MEDQ  D:  10/23/2004  T:  10/24/2004  Job:  [id number]   cc:   Elmore Guise., M.D.  [address]  [place], [place] [address]  Fax: [phone]   Cardiac cath lab

## 2011-01-08 NOTE — Assessment & Plan Note (Signed)
[facility]                             PULMONARY OFFICE NOTE   NAME:Thomas [name], Thomas [name]                    MRN:          [id number]  DATE:11/03/2006                            DOB:          11-03-1940    PROBLEM:  1. Allergic asthma.  2. Allergic rhinitis.  3. History of nasal polyps (aspirin triad).  4. Esophageal reflux.  5. Coronary disease/bypass graft.   HISTORY:  He has remained on aspirin at 81 mg daily and we had discussed  previously stopping that.  He noticed a persistent wheeze, especially at  night, some days worse, no urticaria.  He occasionally uses his  nebulizer or metered inhaler but not daily.  He will wake at night with  some wheeze but never choking or awareness of nocturnal reflux.  P.r.n.  Zantac after suspect foods in the daytime usually takes care of mild  heartburn.  He is moving into a house where there had been a cat.  His  wife and he are both sensitive.  I have discussed getting carpet  replaced as part of his home negotiating for the purchase or at least  getting a professional cleaner in.   MEDICATION:  Aspirin 81 mg, Theo-Dur 200 mg, prednisone 5 mg daily,  Allegra 180 mg, Symbicort 160/4.5 two puffs b.i.d., Zetia 10 mg three  days per week, rescue albuterol inhaler or nebulizer with DuoNeb,  Zantac.   DRUG INTOLERANT:  ADVAIR WITH RASH, SINGULAIR, PENICILLIN, CODEINE,  SULFA, IBUPROFEN.  HE HAD STOPPED ALLERGY VACCINE 2 YEARS AGO BUT HAD  BEEN SKIN TEST POSITIVE.   STUDIES:  Chest x-ray in 2006 had shown no active disease.  Pulmonary  function tests here September 21, 2006 showed moderate obstructive airways  disease, primarily involving the small airway flows where there was some  partial response to bronchodilator.  Measured lung volumes were normal.  Diffusion was mildly reduced.  He had been a smoker.  Pattern suggests  mild emphysema.   OBJECTIVE:  Weight 215 pounds, BP 122/78, pulse 94, room air  saturation  97%.  No wheeze.  Heart sounds regular.  No neck vein distention or  stridor.  No significant nasal stuffiness.  No edema.   IMPRESSION:  Asthma with chronic obstructive pulmonary disease  exacerbation/persistent wheeze.  Question role of low-dose aspirin.   PLAN:  1. Environmental precautions as discussed.  2. Try Spiriva once daily.  3. We refilled his DuoNeb.  4. Stop aspirin for 2-3 weeks then resume if no significant change.  5. We are scheduling return in 6 months but earlier p.r.n.     Clinton D. Maple Hudson, MD, Tonny Bollman, FACP  Electronically Signed    CDY/MedQ  DD: 11/03/2006  DT: 11/04/2006  Job #: [id number]   cc:   Tasia Catchings, M.D.  Cassell Clement, M.D.

## 2011-01-08 NOTE — Cardiovascular Report (Signed)
[name], Thomas [name]             ACCOUNT NO.:  [id number]   MEDICAL RECORD NO.:  [id number]          PATIENT TYPE:  OIB   LOCATION:  2899                         FACILITY:  [facility]   PHYSICIAN:  Elmore Guise., M.D.DATE OF BIRTH:  04-14-1941   DATE OF PROCEDURE:  10/08/2004  DATE OF DISCHARGE:                              CARDIAC CATHETERIZATION   PROCEDURE TYPE:  Left heart catheterization, coronary angiogram, left  ventricular angiogram.   PROCEDURE DESCRIPTION:  Patient was brought to the cardiac catheterization  laboratory after prepping and informed consent.  He was prepped and draped  in a sterile fashion.  Approximately 20 mL of 1% lidocaine was used for  local anesthesia.  A 6-French sheath was placed in the right femoral artery  without difficulty.  Coronary angiography was performed without difficulty.   FINDINGS:  1.  Left main:  Mild mid and distal calcification with a distal 10-20%      narrowing.  2.  LAD:  Proximal calcification with proximal haziness, long mid 60-70%      stenosis.  3.  D1:  Moderate size with no significant disease.  4.  LCX:  Nondominant ostial 60% stenosis.  5.  OM1:  Moderate sized vessel with proximal 80-90% stenosis.  6.  RCA:  Dominant with diffuse disease proximal 70% followed by mid 70% and      distal 50-60% stenosis.  7.  PDA:  Mid 80% stenosis.  8.  LV:  EF of 55-60% with no wall motion abnormalities.  LVEDP was 20 mmHg.   IMPRESSION:  1.  Three vessel coronary artery disease.  2.  Preserved left ventricular systolic function with an ejection fraction      of 55-60% and no wall motion abnormalities.  LVEDP was 20 mmHg.   PLAN:  1.  CT surgery consult for surgical revascularization.  2.  Maximal medical therapy including low dose beta blocker, aspirin,      sublingual nitroglycerin p.r.n.  Risk factor modification as needed.      TWK/MEDQ  D:  10/08/2004  T:  10/08/2004  Job:  [id number]

## 2011-01-08 NOTE — Discharge Summary (Signed)
Thomas [name], RISKO             ACCOUNT NO.:  [id number]   MEDICAL RECORD NO.:  [id number]          PATIENT TYPE:  INP   LOCATION:  2025                         FACILITY:  [facility]   PHYSICIAN:  Toribio Harbour, N.P.DATE OF BIRTH:  1941-05-30   DATE OF ADMISSION:  10/23/2004  DATE OF DISCHARGE:  10/30/2004                                 DISCHARGE SUMMARY   ADMISSION DIAGNOSIS:  Severe three vessel coronary artery disease.   PAST MEDICAL HISTORY:  1.  Dyslipidemia.  2.  Asthma followed by Dr. Jetty Duhamel, treated on and off with steroids      for the next several years.  3.  Colitis managed by Dr. Tasia Catchings.  4.  Surgical history includes right eye surgery status post trauma, right      knee arthroscopy, sinus surgery, excision of bone spurs on his right      heel.   ALLERGIES:  The patient has an allergy to latex, he develops a rash but has  never had anaphylaxis or dyspnea, intolerance to codeine, penicillin causes  a rash, rash with blisters from ibuprofen, also lists sulfa drugs.   DISCHARGE DIAGNOSIS:  Severe three vessel coronary artery disease status  post coronary artery bypass graft.   BRIEF HISTORY:  Thomas [name] is a 70 year old Caucasian man.  Over the year  prior to admission, he had symptoms of mild substernal chest pain with  exertion associated with dyspnea.  His symptoms improved with rest.  He  underwent a stress Cardiolite exam September 28, 2004, which was abnormal.  He  had inferolateral ST depression.  Nuclear imaging showed a small reversible  inferior apical defect.  Left ventricular ejection fraction was 58%.  He  subsequently underwent cardiac catheterization October 08, 2004.  This  showed significant three vessel coronary artery disease.  Left ventricular  ejection fraction is estimated at 55-60%.  He was referred for consideration  of coronary artery bypass grafting and was evaluated by Dr. Evelene Croon at  the [facility] office on October 16, 2004.  After examination of the patient and  review of all available records, Dr. Laneta Simmers agreed that coronary artery  bypass grafting was the preferred treatment choice for this gentleman to  prevent further ischemia and infarction to improve his quality of life.  The  procedure, risks, and benefits were all discussed and Thomas [name] agreed to  proceed with surgery.   HOSPITAL COURSE:  On October 23, 2004, Thomas [name] was electively admitted to  [facility] under the care of Dr. Evelene Croon.  He underwent the  following surgical procedure.  Coronary artery bypass grafting x 4.  The  grafts placed at the time of the procedure were left internal mammary artery  graft to the left anterior descending artery, saphenous vein grafted to the  intermediate artery, saphenous vein was grafted to the first obtuse marginal  artery, and saphenous vein was grafted to the posterior descending artery.  The vein was harvested from bilateral thighs with endovein harvesting  technique.  Vein was harvested from his right lower leg with an open  surgical technique,  however, this vein was not suitable for conduit.  He  tolerated this procedure well transferring in stable condition to the SICU.  He remained hemodynamically stable in the immediate postoperative period.  He was extubated several hours after arrival to the intensive care unit and  awoke from anesthesia neurologically intact.  He required only routine care  in the intensive care unit and was ready for transfer to unit 2000 on  postoperative day two.  His bronchodilators for his asthma were resumed on  postoperative day one.  Over the next two days or so, Thomas [name] made good  progress to the recovery from his surgery.  He did have significant volume  overload, he was treated with Lasix and responded appropriately.  His weight  at discharge is just below his preoperative weight.  Respiratory status, he  did require supplemental oxygen until  postoperative day five.  He has been  diligently working with his incentive spirometer and as his activity  improved, so did his respiratory status.  He does not require oxygen at  discharge.  Thomas [name] did develop some significant nausea in the early  morning hours of postoperative day five.  He did not experience any  vomiting.  His bowel functions were within normal limits.  His abdominal  exam was benign.  This nausea continued for the next 24 hours.  He was  treated with some Phenergan which relieved his nausea but caused him to be  quite drowsy.  By the afternoon of postoperative day six, Thomas [name] was  feeling much better.   October 30, 2004, postoperative day seven on morning rounds, Thomas [name]  reports feeling much better and was able to be discharged home.  His vital  signs were stable with blood pressure 136/86, he was afebrile, and room air  saturation 92%.  His heart is maintaining normal sinus rhythm at 78 beats  per minute, his lungs are clear to auscultation.  He has had no further  nausea and was able to tolerate his meals yesterday afternoon and evening  without any difficulty.  His bowel and bladder functions have remained  within normal limits for him.  His incisions are all healing well, he has no  lower extremity edema.  He is ambulating well with cardiac rehab, his pain  is controlled adequate with Tylenol.  Thomas [name] continues to improve and  he is ready for discharge home this morning October 30, 2004.   LABORATORY DATA:  On March 9, CBC with white blood cell count 4.9,  hemoglobin 12.4, hematocrit 37.1, platelets 285.  Chemistries include sodium  140, potassium 3.9, BUN 14, creatinine 1, glucose 104.  Liver functions were  also studied to rule out any trouble with his gallbladder and these were all  within normal limits.   CONDITION ON DISCHARGE:  Improved.   DISCHARGE MEDICATIONS:  1.  Enteric coated aspirin 325 mg daily. 2.  Toprol XL 25 mg daily,  this is a new dose for him.  3.  Lasix 40 mg daily for five days.  4.  Potassium chloride 20 mEq daily for five days.  5.  He is to continue his home medications of prednisone 2.5 mg daily, this      is a new dose.  6.  Proventil inhaler p.r.n.  7.  Flovent 220 mg 2 puffs b.i.d.  8.  Prevacid 30 mg daily.  9.  For pain management, he may have extra strength Tylenol 500 mg 1-2 p.o.  q.6h. p.r.n.  He has been advised to not take anymore than eight tablets      daily.   DISCHARGE INSTRUCTIONS:  Activities:  He has been asked to refrain from  driving or heavy lifting, pushing, or pulling.  Also instructed to continue  his breathing exercises and daily walking.  Diet:  Low fat, low salt.  Wound  care:  He is to shower daily with mild soap and water.  Should the incision  show any signs of infection, he is to call Dr. Sharee Pimple office.  Follow up:  He is to see Dr. Patty Sermons in his office in approximately two weeks, the  patient has  been asked to call to arrange the appointment and given the office phone  number.  He will have a chest x-ray taken that day.  He has an appointment  to see Dr. Laneta Simmers on Tuesday, March 21, at 1:45 p.m. and is asked to bring  his chest x-ray from Dr. Yevonne Pax office for Dr. Laneta Simmers to review.      CTK/MEDQ  D:  10/30/2004  T:  10/30/2004  Job:  [id number]   cc:   Cassell Clement, M.D.  [address]  [place]  [place] [address]  Fax: [phone]   Tasia Catchings, M.D.  [address]  [place]  [place] [address]  Fax: [phone]

## 2011-01-12 ENCOUNTER — Telehealth: Payer: Self-pay | Admitting: *Deleted

## 2011-01-12 NOTE — Telephone Encounter (Signed)
HYPERTENSION, UNSPECIFIED - Thomas Ancona, MD 12/31/2010 5:32 AM Signed  BP is running high. Increase lisinopril to 10 mg daily with BMET and BP check in 2 wks.     I talked with pt about recent BP readings.   12/31/10 140/78  71      01/01/10 136/77 76       01/04/11 128/78 77      01/06/11 113/76 68    01/08/11 103/75 72 . Pt scheduled for BMP/BNP/Echo/Myoview 01/13/11. Pt denies lightheadedness or dizziness. I will forward to Dr Shirlee Latch for review.

## 2011-01-13 ENCOUNTER — Ambulatory Visit (HOSPITAL_COMMUNITY): Payer: Medicare Other | Attending: Cardiology | Admitting: Radiology

## 2011-01-13 ENCOUNTER — Ambulatory Visit (HOSPITAL_BASED_OUTPATIENT_CLINIC_OR_DEPARTMENT_OTHER): Payer: Medicare Other

## 2011-01-13 ENCOUNTER — Other Ambulatory Visit (INDEPENDENT_AMBULATORY_CARE_PROVIDER_SITE_OTHER): Payer: Medicare Other | Admitting: *Deleted

## 2011-01-13 DIAGNOSIS — R079 Chest pain, unspecified: Secondary | ICD-10-CM

## 2011-01-13 DIAGNOSIS — R072 Precordial pain: Secondary | ICD-10-CM

## 2011-01-13 DIAGNOSIS — I25810 Atherosclerosis of coronary artery bypass graft(s) without angina pectoris: Secondary | ICD-10-CM

## 2011-01-13 DIAGNOSIS — I2510 Atherosclerotic heart disease of native coronary artery without angina pectoris: Secondary | ICD-10-CM | POA: Insufficient documentation

## 2011-01-13 DIAGNOSIS — R0602 Shortness of breath: Secondary | ICD-10-CM

## 2011-01-13 LAB — BASIC METABOLIC PANEL
CO2: 26 mEq/L (ref 19–32)
Chloride: 109 mEq/L (ref 96–112)
Glucose, Bld: 109 mg/dL — ABNORMAL HIGH (ref 70–99)
Potassium: 4.6 mEq/L (ref 3.5–5.1)
Sodium: 141 mEq/L (ref 135–145)

## 2011-01-13 LAB — LIPID PANEL
Cholesterol: 156 mg/dL (ref 0–200)
HDL: 32.1 mg/dL — ABNORMAL LOW (ref >39.00)
LDL Cholesterol: 101 mg/dL — ABNORMAL HIGH (ref 0–99)
Total CHOL/HDL Ratio: 5
Triglycerides: 115 mg/dL (ref 0.0–149.0)
VLDL: 23 mg/dL (ref 0.0–40.0)

## 2011-01-13 LAB — HEPATIC FUNCTION PANEL
Albumin: 3.6 g/dL (ref 3.5–5.2)
Alkaline Phosphatase: 56 U/L (ref 39–117)
Total Protein: 6.5 g/dL (ref 6.0–8.3)

## 2011-01-13 MED ORDER — REGADENOSON 0.4 MG/5ML IV SOLN
0.4000 mg | Freq: Once | INTRAVENOUS | Status: AC
  Administered 2011-01-13: 0.4 mg via INTRAVENOUS

## 2011-01-13 MED ORDER — TECHNETIUM TC 99M TETROFOSMIN IV KIT
33.0000 | PACK | Freq: Once | INTRAVENOUS | Status: AC | PRN
  Administered 2011-01-13: 33 via INTRAVENOUS

## 2011-01-13 MED ORDER — TECHNETIUM TC 99M TETROFOSMIN IV KIT
10.4000 | PACK | Freq: Once | INTRAVENOUS | Status: AC | PRN
  Administered 2011-01-13: 10 via INTRAVENOUS

## 2011-01-13 NOTE — Progress Notes (Addendum)
[facility] SITE 3 NUCLEAR MED [address] [place] [address] [phone]  Cardiology Nuclear Med Study  Thomas [name] is a 70 y.o. male [id number] 1941-08-13   Nuclear Med Background Indication for Stress Test:  Evaluation for Ischemia and Graft Patency History:  '06 Cath: multi-vessel disease, EF=55-60%> CABG x4; '10 MPS: NL, EF=64% Cardiac Risk Factors: History of Smoking, Hypertension and Lipids  Symptoms:  DOE, Fatigue, Fatigue with Exertion, SOB, and tingling sensation in neck and (R) shoulder   Nuclear Pre-Procedure Caffeine/Decaff Intake:  None NPO After: 9:00pm   Lungs:  Minimal expiratory wheeze that cleared after cough IV 0.9% NS with Angio Cath:  20g  IV Site: R Antecubital  IV Started by:  Irean Hong, RN  Chest Size (in):  44 Cup Size: n/a  Height: 5\' 8"  (1.727 m)  Weight:  209 lb (94.802 kg)  BMI:  Body mass index is 31.78 kg/(m^2). Tech Comments:  n/a    Nuclear Med Study 1 or 2 day study: 1 day  Stress Test Type:  Lexiscan  Reading MD: Marca Ancona, MD  Order Authorizing Provider:  Marca Ancona, MD  Resting Radionuclide: Technetium 37m Tetrofosmin  Resting Radionuclide Dose: 10.4 mCi   Stress Radionuclide:  Technetium 46m Tetrofosmin  Stress Radionuclide Dose: 33 mCi           Stress Protocol Rest HR: 56 Stress HR: 82  Rest BP: 138/73 Stress BP: 165/80  Exercise Time (min): n/a METS: n/a   Predicted Max HR: 149 bpm % Max HR: 55.03 bpm Rate Pressure Product: 29518   Dose of Adenosine (mg):  n/a Dose of Lexiscan: 0.4 mg  Dose of Atropine (mg): n/a Dose of Dobutamine: n/a mcg/kg/min (at max HR)  Stress Test Technologist: Irean Hong, RN  Nuclear Technologist:  Domenic Polite, CNMT     Rest Procedure:  Myocardial perfusion imaging was performed at rest 45 minutes following the intravenous administration of Technetium 61m Tetrofosmin. Rest ECG: Sinus Bradycardia  Stress Procedure:  The patient received IV Lexiscan  0.4 mg over 15-seconds.  Technetium 60m Tetrofosmin injected at 30-seconds.  There were nonspecific changes with Lexiscan.  Quantitative spect images were obtained after a 45 minute delay. Stress ECG: No significant change from baseline ECG  QPS Raw Data Images:  Normal; no motion artifact; normal heart/lung ratio. Stress Images:  Normal homogeneous uptake in all areas of the myocardium. Rest Images:  Normal homogeneous uptake in all areas of the myocardium. Subtraction (SDS):  There is no evidence of scar or ischemia. Transient Ischemic Dilatation (Normal <1.22):  0.95 Lung/Heart Ratio (Normal <0.45):  0.41  Quantitative Gated Spect Images QGS EDV:  72 ml QGS ESV:  21 ml QGS cine images:  Normal Wall Motion QGS EF: 71%  Impression Exercise Capacity:  Lexiscan with no exercise. BP Response:  Normal blood pressure response. Clinical Symptoms:  Lightheaded. ECG Impression:  No significant ST segment change suggestive of ischemia. Comparison with Prior Nuclear Study: No significant change from previous study  Overall Impression:  Normal stress nuclear study.  [name]     Normal study.  [name] [employer]

## 2011-01-15 NOTE — Progress Notes (Signed)
Copy routed to Dr.Mclean.Falecha L Clark ° °

## 2011-01-21 NOTE — Progress Notes (Signed)
lmtcb

## 2011-01-22 ENCOUNTER — Telehealth: Payer: Self-pay | Admitting: Cardiology

## 2011-01-22 NOTE — Telephone Encounter (Signed)
Results of ECHO and stress test given to pt--nt

## 2011-01-22 NOTE — Telephone Encounter (Signed)
Pt rtn call from yesterday to get results

## 2011-01-25 ENCOUNTER — Other Ambulatory Visit: Payer: Self-pay | Admitting: *Deleted

## 2011-01-25 NOTE — Progress Notes (Signed)
Discussed results with pt.

## 2011-02-03 NOTE — Telephone Encounter (Signed)
I talked with pt about his BP. Pt states recent BP readings have been in the 117-127/74-80. Pt feels good.

## 2011-02-26 ENCOUNTER — Other Ambulatory Visit: Payer: MEDICARE | Admitting: *Deleted

## 2011-02-28 ENCOUNTER — Other Ambulatory Visit: Payer: Self-pay | Admitting: Internal Medicine

## 2011-03-04 ENCOUNTER — Ambulatory Visit: Payer: Medicare Other | Admitting: Internal Medicine

## 2011-03-05 ENCOUNTER — Other Ambulatory Visit (INDEPENDENT_AMBULATORY_CARE_PROVIDER_SITE_OTHER): Payer: Medicare Other | Admitting: *Deleted

## 2011-03-05 DIAGNOSIS — E785 Hyperlipidemia, unspecified: Secondary | ICD-10-CM

## 2011-03-05 LAB — HEPATIC FUNCTION PANEL
AST: 23 U/L (ref 0–37)
Albumin: 3.8 g/dL (ref 3.5–5.2)
Alkaline Phosphatase: 49 U/L (ref 39–117)
Bilirubin, Direct: 0.1 mg/dL (ref 0.0–0.3)
Total Bilirubin: 0.4 mg/dL (ref 0.3–1.2)

## 2011-03-05 LAB — LIPID PANEL
HDL: 48.2 mg/dL (ref >39.00)
LDL Cholesterol: 104 mg/dL — ABNORMAL HIGH (ref 0–99)
VLDL: 17.8 mg/dL (ref 0.0–40.0)

## 2011-03-08 ENCOUNTER — Telehealth: Payer: Self-pay | Admitting: Cardiology

## 2011-03-08 DIAGNOSIS — E785 Hyperlipidemia, unspecified: Secondary | ICD-10-CM

## 2011-03-08 NOTE — Telephone Encounter (Signed)
Notes Recorded by Jacqlyn Krauss, RN on 03/08/2011 at 12:26 PM I discussed with pt. Pt has been taking Niaspan 500mg  hs. He had tried to increase dose to 1000mg  but had been unable to tolerate. He will try again to increase Niaspan to 1000mg  hs. If he tolerates this dose he will increase to 1500mg  hs. He will return for fasting lipid profile 05/04/11. He will call back if he is unable to tolerate Niaspan at higher dose. Notes Recorded by Jacqlyn Krauss, RN on 03/08/2011 at 9:19 AM LMTCB Notes Recorded by Marca Ancona, MD on 03/07/2011 at 12:22 AM If he has tolerated Niaspan 1000 mg daily, increase to 2000 mg daily with repeat lipids in 2 months.

## 2011-03-08 NOTE — Telephone Encounter (Signed)
rtn call back to nurse.  

## 2011-03-10 ENCOUNTER — Other Ambulatory Visit: Payer: Self-pay | Admitting: *Deleted

## 2011-03-10 MED ORDER — MOMETASONE FURO-FORMOTEROL FUM 100-5 MCG/ACT IN AERO: 2.0000 | INHALATION_SPRAY | Freq: Two times a day (BID) | RESPIRATORY_TRACT | Status: DC

## 2011-03-10 NOTE — Telephone Encounter (Signed)
Received fax from [facility] [address] for [employer] 

## 2011-03-30 ENCOUNTER — Ambulatory Visit: Payer: Medicare Other | Admitting: Internal Medicine

## 2011-03-31 ENCOUNTER — Encounter: Payer: Self-pay | Admitting: Internal Medicine

## 2011-03-31 ENCOUNTER — Ambulatory Visit (INDEPENDENT_AMBULATORY_CARE_PROVIDER_SITE_OTHER): Payer: Medicare Other | Admitting: Internal Medicine

## 2011-03-31 VITALS — BP 118/76 | HR 73 | Ht 68.5 in | Wt 217.8 lb

## 2011-03-31 DIAGNOSIS — J45909 Unspecified asthma, uncomplicated: Secondary | ICD-10-CM

## 2011-03-31 DIAGNOSIS — J309 Allergic rhinitis, unspecified: Secondary | ICD-10-CM

## 2011-03-31 NOTE — Progress Notes (Signed)
Subjective:    Patient ID: Thomas [name], male    DOB: [birth year], 70 y.o.   MRN: [id number]  HPI 03/31/11- 59 yoM former smoker followed for allergic rhinitis, asthma, colitis Last here October 22, 2010 New cough starting 1 month ago- dry , with tussive left back pain= annoying. Cough several times daily. No cause or pattern recognized. He has been on lisinopril from Dr Shirlee Latch started in May. Not short of breath. Albuterol rescue inhaler or nebulizer used every other night- do help. He asks about going back on maintenance prednisone for this. Took some from Dr Judie Petit. Laural Benes for colitis in June. CXR 06/15/10- stable and clear, NAD, old CABG  Review of Systems Constitutional:   No-   weight loss, night sweats, fevers, chills, fatigue, lassitude. HEENT:   No-  headaches, difficulty swallowing, tooth/dental problems, sore throat,       No-  sneezing, itching, ear ache, nasal congestion, post nasal drip,  CV:  No-   chest pain, orthopnea, PND, swelling in lower extremities, anasarca, dizziness, palpitations Resp: No-   shortness of breath with exertion or at rest.                No-  coughing up of blood.              No-   change in color of mucus.  Little wheezing.   Skin: No-   rash or lesions. GI:  No- acute  heartburn, indigestion, abdominal pain, nausea, vomiting, diarrhea,                 change in bowel habits, loss of appetite GU: No-   dysuria, change in color of urine, no urgency or frequency.  No- flank pain. MS:  No-   joint pain or swelling.  No- decreased range of motion.  No- back pain. Neuro- grossly normal to observation, Or:  Psych:  No- change in mood or affect. No depression or anxiety.  No memory loss.       Objective:   Physical Exam General- Alert, Oriented, Affect-appropriate, Distress- none acute  overweight Skin- rash-none, lesions- none, excoriation- none Lymphadenopathy- none Head- atraumatic            Eyes- Gross vision intact, PERRLA, conjunctivae clear  secretions  Chronic squint right eye            Ears- Hearing, canals normal            Nose- Clear,No- Septal dev, mucus, polyps, erosion, perforation             Throat- Mallampati III , mucosa clear , drainage- none, tonsils- atrophic Neck- flexible , trachea midline, no stridor , thyroid nl, carotid no bruit Chest - symmetrical excursion , unlabored           Heart/CV- RRR , no murmur , no gallop  , no rub, nl s1 s2                           - JVD- none , edema- none, stasis changes- none, varices- none           Lung- clear to P&A, wheeze- none , dullness-none, rub- none                      + recurrent dry cough           Chest wall-  Abd- tender-no, distended-no, bowel sounds-present, HSM- no Br/  Gen/ Rectal- Not done, not indicated Extrem- cyanosis- none, clubbing, none, atrophy- none, strength- nl Neuro- grossly intact to observation         Assessment & Plan:

## 2011-03-31 NOTE — Assessment & Plan Note (Addendum)
Dulera had seemed to make a difference as a maintenance inhaler a year ago and has been continued. Dry cough might be asthma, but from the timing I suspect ACEI cough from lisinopril. We discussed this and will give samples Benicar 20 mg to replace lisinopril for long enough to assess.

## 2011-03-31 NOTE — Assessment & Plan Note (Signed)
Allergy vaccine  Has helped him.

## 2011-03-31 NOTE — Patient Instructions (Signed)
For one month, try replacing lisinopril with samples Benicar 1/2 x 20 mg tablet daily. Watch for the dry cough to improve over next couple of weeks. If this change helps the cough, then please ask Dr Shirlee Latch how he would like to handle it long term.

## 2011-04-03 ENCOUNTER — Encounter: Payer: Self-pay | Admitting: Internal Medicine

## 2011-05-04 ENCOUNTER — Other Ambulatory Visit: Payer: Medicare Other | Admitting: *Deleted

## 2011-07-08 ENCOUNTER — Encounter (HOSPITAL_COMMUNITY): Payer: Self-pay

## 2011-07-08 ENCOUNTER — Ambulatory Visit (HOSPITAL_COMMUNITY): Admit: 2011-07-08 | Payer: Self-pay | Admitting: Gastroenterology

## 2011-07-08 SURGERY — COLONOSCOPY
Anesthesia: Moderate Sedation

## 2011-08-25 ENCOUNTER — Other Ambulatory Visit: Payer: Self-pay | Admitting: Internal Medicine

## 2011-10-01 ENCOUNTER — Ambulatory Visit (INDEPENDENT_AMBULATORY_CARE_PROVIDER_SITE_OTHER): Payer: Medicare Other | Admitting: Internal Medicine

## 2011-10-01 ENCOUNTER — Encounter: Payer: Self-pay | Admitting: Internal Medicine

## 2011-10-01 VITALS — BP 130/68 | HR 99 | Ht 68.5 in | Wt 219.2 lb

## 2011-10-01 DIAGNOSIS — J45909 Unspecified asthma, uncomplicated: Secondary | ICD-10-CM

## 2011-10-01 DIAGNOSIS — K219 Gastro-esophageal reflux disease without esophagitis: Secondary | ICD-10-CM

## 2011-10-01 DIAGNOSIS — I10 Essential (primary) hypertension: Secondary | ICD-10-CM

## 2011-10-01 MED ORDER — PREDNISONE 5 MG PO TABS: 5.0000 mg | ORAL_TABLET | ORAL | Status: DC | PRN

## 2011-10-01 NOTE — Assessment & Plan Note (Signed)
Ongoing reminder reflux precautions

## 2011-10-01 NOTE — Progress Notes (Signed)
Patient ID: Thomas [name], male    DOB: October 05, 1940, 71 y.o.   MRN: [id number]  HPI 03/31/11- 66 yoM former smoker followed for allergic rhinitis, asthma, colitis Last here October 22, 2010 New cough starting 1 month ago- dry , with tussive left back pain= annoying. Cough several times daily. No cause or pattern recognized. He has been on lisinopril from Dr Shirlee Latch started in May. Not short of breath. Albuterol rescue inhaler or nebulizer used every other night- do help. He asks about going back on maintenance prednisone for this. Took some from Dr Judie Petit. Laural Benes for colitis in June. CXR 06/15/10- stable and clear, NAD, old CABG  10/01/11- 70 yoM former smoker followed for allergic rhinitis, asthma, colitis Had flu vax and avoided any distinct resp inf this winter. Has had persistent nasal congestion, cough, drainage, white to yellow mucus x 1 month with no definite onset. Some wheeze. Using rescue inh ~1x/ day, dulera twice daily, neb ~ 1-2x/ month. Denies headache, fever, purulent or blood.  Persistent cough was not changed by using Benicar x 1 month so went back on lisinopril. Unlikely that cough is affected by ACE inhibitor. Feeling run down- asks permission to try multivitamin. Discussed ddx- he will first try mvi and discuss w/ PCP.   Review of Systems Constitutional:   No-   weight loss, night sweats, fevers, chills, fatigue, +lassitude. HEENT:   No-  headaches, difficulty swallowing, tooth/dental problems, sore throat,       No-  sneezing, itching, ear ache,   +nasal congestion, post nasal drip,  CV:  No-   chest pain, orthopnea, PND, swelling in lower extremities, anasarca, dizziness, palpitations Resp: No-   shortness of breath with exertion or at rest.                No-  coughing up of blood.              No-   change in color of mucus.  Some wheezing.   Skin: No-   rash or lesions. GI:  No- acute  heartburn, indigestion, abdominal pain, nausea, vomiting, diarrhea,                 change  in bowel habits, loss of appetite GU: MS:  No-   joint pain or swelling.  No- decreased range of motion.  No- back pain. Neuro- grossly normal to observation, Or:  Psych:  No- change in mood or affect. No depression or anxiety.  No memory loss.   Objective:   Physical Exam General- Alert, Oriented, Affect-appropriate, Distress- none acute  overweight Skin- rash-none, lesions- none, excoriation- none Lymphadenopathy- none Head- atraumatic            Eyes- Gross vision intact, PERRLA, conjunctivae clear secretions  Chronic squint right eye            Ears- Hearing, canals normal            Nose- Clear,No- Septal dev, mucus, polyps, erosion, perforation             Throat- Mallampati III-IV , mucosa clear , drainage- none, tonsils- atrophic Neck- flexible , trachea midline, no stridor , thyroid nl, carotid no bruit Chest - symmetrical excursion , unlabored           Heart/CV- RRR , no murmur , no gallop  , no rub, nl s1 s2                           -  JVD- none , edema- none, stasis changes- none, varices- none           Lung- mild wheeze R base , dullness-none, rub- none, not coughing                              Chest wall-  Abd-  Br/ Gen/ Rectal- Not done, not indicated Extrem- cyanosis- none, clubbing, none, atrophy- none, strength- nl Neuro- grossly intact to observation

## 2011-10-01 NOTE — Assessment & Plan Note (Signed)
Persistent nonspecific drainage and mild asthma through winter- not pollen season.  Plan - ok to resume maintenance prednisone 5 mg daily or every other day as discussed. Stop when able.

## 2011-10-01 NOTE — Patient Instructions (Signed)
Script sent to resume maintenance prednisone 5 mg daily while needed. Stop when you can.  Please call as needed

## 2011-10-14 ENCOUNTER — Other Ambulatory Visit: Payer: Self-pay | Admitting: Gastroenterology

## 2011-11-08 ENCOUNTER — Other Ambulatory Visit: Payer: Self-pay | Admitting: Internal Medicine

## 2012-01-22 ENCOUNTER — Encounter (HOSPITAL_COMMUNITY): Payer: Self-pay | Admitting: *Deleted

## 2012-01-22 ENCOUNTER — Observation Stay (HOSPITAL_COMMUNITY)
Admission: EM | Admit: 2012-01-22 | Discharge: 2012-01-23 | DRG: 312 | Disposition: A | Discharge status: Home / Self Care | Payer: Medicare Other | Attending: Internal Medicine | Admitting: Internal Medicine

## 2012-01-22 ENCOUNTER — Emergency Department (HOSPITAL_COMMUNITY): Payer: Medicare Other

## 2012-01-22 DIAGNOSIS — R55 Syncope and collapse: Secondary | ICD-10-CM

## 2012-01-22 DIAGNOSIS — R110 Nausea: Secondary | ICD-10-CM | POA: Insufficient documentation

## 2012-01-22 DIAGNOSIS — I2510 Atherosclerotic heart disease of native coronary artery without angina pectoris: Secondary | ICD-10-CM | POA: Insufficient documentation

## 2012-01-22 DIAGNOSIS — R61 Generalized hyperhidrosis: Secondary | ICD-10-CM | POA: Insufficient documentation

## 2012-01-22 DIAGNOSIS — R062 Wheezing: Secondary | ICD-10-CM | POA: Diagnosis present

## 2012-01-22 DIAGNOSIS — J449 Chronic obstructive pulmonary disease, unspecified: Secondary | ICD-10-CM | POA: Insufficient documentation

## 2012-01-22 DIAGNOSIS — E785 Hyperlipidemia, unspecified: Secondary | ICD-10-CM | POA: Insufficient documentation

## 2012-01-22 DIAGNOSIS — R0602 Shortness of breath: Secondary | ICD-10-CM | POA: Insufficient documentation

## 2012-01-22 DIAGNOSIS — I10 Essential (primary) hypertension: Secondary | ICD-10-CM | POA: Insufficient documentation

## 2012-01-22 DIAGNOSIS — H5702 Anisocoria: Secondary | ICD-10-CM | POA: Insufficient documentation

## 2012-01-22 DIAGNOSIS — E860 Dehydration: Secondary | ICD-10-CM

## 2012-01-22 DIAGNOSIS — J45909 Unspecified asthma, uncomplicated: Secondary | ICD-10-CM

## 2012-01-22 DIAGNOSIS — Z8612 Personal history of poliomyelitis: Secondary | ICD-10-CM | POA: Insufficient documentation

## 2012-01-22 DIAGNOSIS — K219 Gastro-esophageal reflux disease without esophagitis: Secondary | ICD-10-CM | POA: Insufficient documentation

## 2012-01-22 DIAGNOSIS — I119 Hypertensive heart disease without heart failure: Secondary | ICD-10-CM | POA: Insufficient documentation

## 2012-01-22 DIAGNOSIS — E782 Mixed hyperlipidemia: Secondary | ICD-10-CM | POA: Insufficient documentation

## 2012-01-22 HISTORY — DX: Pupillary abnormality, right eye: H21.561

## 2012-01-22 LAB — CARDIAC PANEL(CRET KIN+CKTOT+MB+TROPI): Troponin I: <0.3 ng/mL (ref <0.30)

## 2012-01-22 LAB — CBC
HCT: 38.8 % — ABNORMAL LOW (ref 39.0–52.0)
MCHC: 33.7 g/dL (ref 30.0–36.0)
MCV: 91.3 fL (ref 78.0–100.0)
Platelets: 184 10*3/uL (ref 150–400)
RBC: 4.25 MIL/uL (ref 4.22–5.81)
RDW: 14.5 % (ref 11.5–15.5)
WBC: 7.7 10*3/uL (ref 4.0–10.5)

## 2012-01-22 LAB — D-DIMER, QUANTITATIVE: D-Dimer, Quant: 1.45 ug/mL-FEU — ABNORMAL HIGH (ref 0.00–0.48)

## 2012-01-22 LAB — CREATININE, SERUM
Creatinine, Ser: 0.89 mg/dL (ref 0.50–1.35)
GFR calc non Af Amer: 83 mL/min — ABNORMAL LOW (ref >90)

## 2012-01-22 LAB — DIFFERENTIAL
Eosinophils Relative: 11 % — ABNORMAL HIGH (ref 0–5)
Lymphocytes Relative: 21 % (ref 12–46)
Lymphs Abs: 1.6 10*3/uL (ref 0.7–4.0)
Monocytes Absolute: 0.6 10*3/uL (ref 0.1–1.0)

## 2012-01-22 LAB — POCT I-STAT, CHEM 8
Chloride: 111 mEq/L (ref 96–112)
Glucose, Bld: 99 mg/dL (ref 70–99)
HCT: 40 % (ref 39.0–52.0)
Potassium: 3.5 mEq/L (ref 3.5–5.1)

## 2012-01-22 LAB — POCT I-STAT TROPONIN I

## 2012-01-22 MED ORDER — ALUM & MAG HYDROXIDE-SIMETH 200-200-20 MG/5ML PO SUSP: 30.0000 mL | Freq: Four times a day (QID) | ORAL | Status: DC | PRN

## 2012-01-22 MED ORDER — MOMETASONE FURO-FORMOTEROL FUM 100-5 MCG/ACT IN AERO
2.0000 | INHALATION_SPRAY | Freq: Two times a day (BID) | RESPIRATORY_TRACT | Status: DC
  Filled 2012-01-22: qty 13

## 2012-01-22 MED ORDER — LEVALBUTEROL HCL 0.63 MG/3ML IN NEBU
0.6300 mg | INHALATION_SOLUTION | Freq: Four times a day (QID) | RESPIRATORY_TRACT | Status: DC | PRN
  Filled 2012-01-22: qty 3

## 2012-01-22 MED ORDER — PANTOPRAZOLE SODIUM 40 MG PO TBEC
40.0000 mg | DELAYED_RELEASE_TABLET | Freq: Every day | ORAL | Status: DC
  Administered 2012-01-22: 40 mg via ORAL
  Filled 2012-01-22: qty 1

## 2012-01-22 MED ORDER — ALBUTEROL SULFATE (5 MG/ML) 0.5% IN NEBU
INHALATION_SOLUTION | RESPIRATORY_TRACT | Status: AC
  Administered 2012-01-22: 18:00:00
  Filled 2012-01-22: qty 1

## 2012-01-22 MED ORDER — IOHEXOL 350 MG/ML SOLN
100.0000 mL | Freq: Once | INTRAVENOUS | Status: AC | PRN
  Administered 2012-01-22: 100 mL via INTRAVENOUS

## 2012-01-22 MED ORDER — OLSALAZINE SODIUM 250 MG PO CAPS
500.0000 mg | ORAL_CAPSULE | Freq: Every day | ORAL | Status: DC
  Filled 2012-01-22 (×2): qty 2

## 2012-01-22 MED ORDER — ACETAMINOPHEN 325 MG PO TABS: 650.0000 mg | ORAL_TABLET | Freq: Four times a day (QID) | ORAL | Status: DC | PRN

## 2012-01-22 MED ORDER — PREDNISONE 50 MG PO TABS
60.0000 mg | ORAL_TABLET | Freq: Once | ORAL | Status: AC
  Administered 2012-01-22: 60 mg via ORAL
  Filled 2012-01-22: qty 1

## 2012-01-22 MED ORDER — ONDANSETRON HCL 4 MG PO TABS: 4.0000 mg | ORAL_TABLET | Freq: Four times a day (QID) | ORAL | Status: DC | PRN

## 2012-01-22 MED ORDER — ENOXAPARIN SODIUM 40 MG/0.4ML ~~LOC~~ SOLN
40.0000 mg | SUBCUTANEOUS | Status: DC
  Administered 2012-01-22: 40 mg via SUBCUTANEOUS
  Filled 2012-01-22 (×2): qty 0.4

## 2012-01-22 MED ORDER — ONDANSETRON HCL 4 MG/2ML IJ SOLN: 4.0000 mg | Freq: Four times a day (QID) | INTRAMUSCULAR | Status: DC | PRN

## 2012-01-22 MED ORDER — SODIUM CHLORIDE 0.45 % IV SOLN
INTRAVENOUS | Status: DC
  Administered 2012-01-22: 22:00:00 via INTRAVENOUS

## 2012-01-22 MED ORDER — SODIUM CHLORIDE 0.9 % IJ SOLN
3.0000 mL | Freq: Two times a day (BID) | INTRAMUSCULAR | Status: DC
  Administered 2012-01-22: 3 mL via INTRAVENOUS

## 2012-01-22 MED ORDER — OLSALAZINE SODIUM 250 MG PO CAPS: 500.0000 mg | ORAL_CAPSULE | Freq: Every day | ORAL | Status: DC

## 2012-01-22 MED ORDER — ALBUTEROL SULFATE HFA 108 (90 BASE) MCG/ACT IN AERS: 2.0000 | INHALATION_SPRAY | Freq: Four times a day (QID) | RESPIRATORY_TRACT | Status: DC | PRN

## 2012-01-22 MED ORDER — LEVALBUTEROL HCL 0.63 MG/3ML IN NEBU
0.6300 mg | INHALATION_SOLUTION | Freq: Four times a day (QID) | RESPIRATORY_TRACT | Status: DC
  Filled 2012-01-22 (×3): qty 3

## 2012-01-22 MED ORDER — ONDANSETRON HCL 4 MG/2ML IJ SOLN
INTRAMUSCULAR | Status: AC
  Administered 2012-01-22: 18:00:00
  Filled 2012-01-22: qty 2

## 2012-01-22 MED ORDER — ASPIRIN EC 81 MG PO TBEC
81.0000 mg | DELAYED_RELEASE_TABLET | Freq: Every day | ORAL | Status: DC
  Filled 2012-01-22: qty 1

## 2012-01-22 MED ORDER — ACETAMINOPHEN 650 MG RE SUPP: 650.0000 mg | Freq: Four times a day (QID) | RECTAL | Status: DC | PRN

## 2012-01-22 NOTE — ED Notes (Signed)
PT states no pain at this time. AIDET performed. No needs at this time.

## 2012-01-22 NOTE — ED Provider Notes (Signed)
History     CSN: [id number]  Arrival date & time 01/22/12  1410   First MD Initiated Contact with Patient 01/22/12 1511      Chief Complaint  Patient presents with  . Near Syncope    (Consider location/radiation/quality/duration/timing/severity/associated sxs/prior treatment) Patient is a 71 y.o. male presenting with syncope. The history is provided by the patient.  Loss of Consciousness This is a new (Patient was picking up Lower and moving it from his car to his wife's car and that is when his symptoms developed) problem. The current episode started less than 1 hour ago. The problem occurs constantly. The problem has been resolved. Associated symptoms include shortness of breath. Pertinent negatives include no chest pain. Associated symptoms comments: Nausea, diaphoresis. The symptoms are aggravated by walking and standing. The symptoms are relieved by lying down. He has tried nothing for the symptoms. The treatment provided significant relief.    Past Medical History  Diagnosis Date  . Allergic rhinitis   . Allergic asthma   . Polio     left leg  . GERD (gastroesophageal reflux disease)   . 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 HLD (hyperlipidemia)   . HTN (hypertension)   . Colitis   . Obesity   . Pupil irregularity, right     old injury    Past Surgical History  Procedure Date  . Coronary artery bypass graft 3/06  . Total knee arthroplasty   . Cataract extraction   . Eye surgery     rebuild eye socket (R)  . Nasal sinus surgery   . Lumbar disc surgery 2011    Family History  Problem Relation Age of Onset  . Alzheimer's disease Mother   . Liver disease Sister   . Lung cancer Brother     History  Substance Use Topics  . Smoking status: Former Smoker    Quit date: 08/23/1992  . Smokeless tobacco: Not on file  . Alcohol Use: Not on file      Review of Systems  Respiratory: Positive for shortness of breath.   Cardiovascular: Positive for  syncope. Negative for chest pain.  All other systems reviewed and are negative.    Allergies  Aspirin; Codeine; Fluticasone-salmeterol; Montelukast sodium; Cephalexin; Ibuprofen; Penicillins; Statins; and Sulfonamide derivatives  Home Medications   Current Outpatient Rx  Name Route Sig Dispense Refill  . ALBUTEROL SULFATE HFA 108 (90 BASE) MCG/ACT IN AERS Inhalation Inhale 2 puffs into the lungs every 6 (six) hours as needed. For wheezing.    . ASPIRIN EC 81 MG PO TBEC Oral Take 81 mg by mouth daily.    Marland Kitchen CIMETIDINE 200 MG PO TABS Oral Take 200 mg by mouth 4 (four) times daily as needed. For acid reflux.    Marland Kitchen FEXOFENADINE HCL 180 MG PO TABS Oral Take 180 mg by mouth daily.      . MOMETASONE FURO-FORMOTEROL FUM 100-5 MCG/ACT IN AERO Inhalation Inhale 2 puffs into the lungs 2 (two) times daily.    Marland Kitchen OLSALAZINE SODIUM 250 MG PO CAPS Oral Take 500 mg by mouth daily.      Marland Kitchen PREDNISONE 5 MG PO TABS Oral Take 5 mg by mouth daily as needed. For wheezing.    Marland Kitchen LISINOPRIL 10 MG PO TABS Oral Take 5 mg by mouth daily.        BP 126/70  Pulse 70  Resp 19  SpO2 100%  Physical Exam  Nursing note and  vitals reviewed. Constitutional: He is oriented to person, place, and time. He appears well-developed and well-nourished. No distress.  HENT:  Head: Normocephalic and atraumatic.  Mouth/Throat: Oropharynx is clear and moist.  Eyes: Conjunctivae and EOM are normal. Pupils are equal, round, and reactive to light.  Neck: Normal range of motion. Neck supple.  Cardiovascular: Normal rate, regular rhythm and intact distal pulses.   No murmur heard. Pulmonary/Chest: Effort normal and breath sounds normal. No respiratory distress. He has no wheezes. He has no rales.  Abdominal: Soft. He exhibits no distension. There is no tenderness. There is no rebound and no guarding.  Musculoskeletal: Normal range of motion. He exhibits no edema and no tenderness.  Neurological: He is alert and oriented to person,  place, and time.  Skin: Skin is warm and dry. No rash noted. No erythema.  Psychiatric: He has a normal mood and affect. His behavior is normal.    ED Course  Procedures (including critical care time)  Labs Reviewed  CBC - Abnormal; Notable for the following:    HCT 38.8 (*)    All other components within normal limits  DIFFERENTIAL - Abnormal; Notable for the following:    Eosinophils Relative 11 (*)    Eosinophils Absolute 0.9 (*)    All other components within normal limits  D-DIMER, QUANTITATIVE - Abnormal; Notable for the following:    D-Dimer, Quant 1.45 (*)    All other components within normal limits  POCT I-STAT, CHEM 8 - Abnormal; Notable for the following:    Sodium 146 (*)    All other components within normal limits  GLUCOSE, CAPILLARY  POCT I-STAT TROPONIN I   Ct Angio Chest W/cm &/or Wo Cm  01/22/2012  *RADIOLOGY REPORT*  Clinical Data: Elevated D-dimer  CT ANGIOGRAPHY CHEST  Technique:  Multidetector CT imaging of the chest using the standard protocol during bolus administration of intravenous contrast. Multiplanar reconstructed images including MIPs were obtained and reviewed to evaluate the vascular anatomy.  Contrast: OMNIPAQUE IOHEXOL 350 MG/ML SOLN  Comparison: None  Findings: No enlarged supraclavicular or axillary lymph nodes.  No enlarged mediastinal or hilar lymph nodes.  No pericardial or pleural effusion identified.  The pulmonary arteries appear patent.  There is no filling defects to suggest acute pulmonary embolus.  Atelectasis is noted within both lung bases.  No airspace consolidation identified.  No interstitial edema.  No suspicious pulmonary nodules or masses identified. Limited imaging through the upper abdomen shows stones within the lumen of the gallbladder.  IMPRESSION:  1.  No acute pulmonary embolus. 2.  Atelectasis within both lung bases.  Original Report Authenticated By: Rosealee Albee, M.D.     Date: 01/22/2012  Rate: 75  Rhythm:  normal sinus rhythm  QRS Axis: right  Intervals: normal  ST/T Wave abnormalities: normal  Conduction Disutrbances:none  Narrative Interpretation:   Old EKG Reviewed: unchanged   No diagnosis found.    MDM   Patient with a history of CABG and was working today out in the yard lifting in [place] are in started to feel weak, diaphoretic, mild shortness of breath and then had an episode of syncope. He denied any chest pain, palpitations and no history of syncope. He also had been nauseated throughout. He received anti-medic by EMS and states that that is an improvement and he is feeling better. Patient's EKG is within normal limits. Troponin, CBC, BMP are all within normal limits. Patient had a d-dimer ordered in triage which was elevated. I  would not have done a d-dimer on the patient for now since it is elevated feel that he has to go for a CTA of his chest. Feel that patient needs admission and observation for the near syncopal episode and enzyme rule out.        Gwyneth Sprout, MD 01/22/12 1745

## 2012-01-22 NOTE — ED Notes (Signed)
Moving lawnmower from one trunk to the next, then near syncope - sob, nausea, no cp. Diaphoretic. Expiratory wheezing, and inspiration poor, ems gave 66m albuterol.

## 2012-01-22 NOTE — ED Notes (Signed)
Report was given to [employer] on 3700 by Yehuda Budd.

## 2012-01-22 NOTE — ED Notes (Signed)
Family at bedside. Family states, "pt. Mowing lawn; saw pt. Tilt head back like catching breath and became pale; shaking activity."

## 2012-01-22 NOTE — H&P (Signed)
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 PT is a good historian. He was trying fit a box in the car, was working in the sun in the afternoon, felt dizzy , nauseated and sat on the porch and passed out. The wife witnessed the episode, and reported that he did not hit his head. She called911 and was brought to ED. He underwent CT ANGIO of the chest , It was negative for PE.  He told me his PCP is Dr Valentina Lucks with [facility]. He denies any chest pain, sob or palpitations.  Review of Systems:  The patient denies anorexia, fever, weight loss,, vision loss, decreased hearing, hoarseness, chest pain, peripheral edema, balance deficits, hemoptysis, abdominal pain, melena, hematochezia, severe indigestion/heartburn, hematuria, incontinence, genital sores, muscle weakness, suspicious skin lesions, transient blindness, difficulty walking, depression, unusual weight change, abnormal bleeding, enlarged lymph nodes, angioedema, and breast masses.  Past Medical History: Past Medical History  Diagnosis Date  . Allergic rhinitis   . Allergic asthma   . Polio     left leg  . GERD (gastroesophageal reflux disease)   . 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 HLD (hyperlipidemia)   . HTN (hypertension)   . Colitis   . Obesity   . Pupil irregularity, right     old injury   Past Surgical History  Procedure Date  . Coronary artery bypass graft 3/06  . Total knee arthroplasty   . Cataract extraction   . Eye surgery     rebuild eye socket (R)  . Nasal sinus surgery   . Lumbar disc surgery 2011    Medications: Prior to Admission medications   Medication Sig Start Date End Date Taking? Authorizing Provider  albuterol (PROVENTIL HFA;VENTOLIN HFA) 108 (90 BASE) MCG/ACT inhaler Inhale 2 puffs into the lungs every 6  (six) hours as needed. For wheezing.   Yes Historical Provider, MD  aspirin EC 81 MG tablet Take 81 mg by mouth daily.   Yes Historical Provider, MD  cimetidine (TAGAMET) 200 MG tablet Take 200 mg by mouth 4 (four) times daily as needed. For acid reflux.   Yes Historical Provider, MD  fexofenadine (ALLEGRA) 180 MG tablet Take 180 mg by mouth daily.     Yes Historical Provider, MD  mometasone-formoterol (DULERA) 100-5 MCG/ACT AERO Inhale 2 puffs into the lungs 2 (two) times daily.   Yes Historical Provider, MD  olsalazine (DIPENTUM) 250 MG capsule Take 500 mg by mouth daily.     Yes Historical Provider, MD  predniSONE (DELTASONE) 5 MG tablet Take 5 mg by mouth daily as needed. For wheezing. 10/01/11 09/30/12 Yes Clinton D Young, MD  lisinopril (PRINIVIL,ZESTRIL) 10 MG tablet Take 5 mg by mouth daily.   12/29/10 12/29/11  Laurey Morale, MD    Allergies:   Allergies  Allergen Reactions  . Aspirin Hives  . Codeine Hives  . Fluticasone-Salmeterol Hives  . Montelukast Sodium Hives  . Cephalexin Rash  . Ibuprofen Rash  . Penicillins Rash  . Statins Rash  . Sulfonamide Derivatives Rash    Social History:  reports that he quit smoking about 19 years ago. He does not have any smokeless tobacco history on file. His alcohol and drug histories not on file.   Family History: Family History  Problem Relation Age of Onset  . Alzheimer's disease Mother   . Liver disease Sister   . Lung cancer Brother     Physical Exam: Filed Vitals:   01/22/12 1830 01/22/12 1833 01/22/12 1845 01/22/12 1900  BP: 132/75 132/75 128/73 135/93  Pulse: 72 72 73 70  Resp: 15 20 19 19   SpO2: 100% 100% 99% 98%   Constitutional: Vital signs reviewed.  Patient is a well-developed and well-nourished in no acute distress and cooperative with exam. Alert and oriented x3.  Head: Normocephalic and atraumatic Ear: TM normal bilaterally Mouth: no erythema or exudates, MMM Eyes: PERRL, EOMI, conjunctivae normal, No scleral  icterus.  Neck: Supple, Trachea midline normal ROM, No JVD, mass, thyromegaly, or carotid bruit present.  Cardiovascular: RRR, S1 normal, S2 normal, no MRG, pulses symmetric and intact bilaterally Pulmonary/Chest: CTAB, bilateral exp wheezing present.   No rales, or rhonchi Abdominal: Soft. Non-tender, non-distended, bowel sounds are normal, no masses, organomegaly, or guarding present.  GU: no CVA tenderness Musculoskeletal: No joint deformities, erythema, or stiffness, ROM full and no nontender Hematology: no cervical, inginal, or axillary adenopathy.  Neurological: A&O x3, Strenght is normal and symmetric bilaterally, cranial nerve II-XII are grossly intact, no focal motor deficit, sensory intact to light touch bilaterally.  Skin: Warm, dry and intact. No rash, cyanosis, or clubbing.  Psychiatric: Normal mood and affect.  Labs on Admission:   [facility] 01/22/12 1456  NA 146*  K 3.5  CL 111  CO2 --  GLUCOSE 99  BUN 18  CREATININE 0.90  CALCIUM --  MG --  PHOS --   No results found for this basename: AST:2,ALT:2,ALKPHOS:2,BILITOT:2,PROT:2,ALBUMIN:2 in the last 72 hours No results found for this basename: LIPASE:2,AMYLASE:2 in the last 72 hours  Basename 01/22/12 1456 01/22/12 1432  WBC -- 7.7  NEUTROABS -- 4.6  HGB 13.6 13.2  HCT 40.0 38.8*  MCV -- 91.3  PLT -- 184   No results found for this basename: CKTOTAL:3,CKMB:3,CKMBINDEX:3,TROPONINI:3 in the last 72 hours No results found for this basename: TSH,T4TOTAL,FREET3,T3FREE,THYROIDAB in the last 72 hours No results found for this basename: VITAMINB12:2,FOLATE:2,FERRITIN:2,TIBC:2,IRON:2,RETICCTPCT:2 in the last 72 hours  Radiological Exams on Admission: Ct Angio Chest W/cm &/or Wo Cm  01/22/2012  *RADIOLOGY REPORT*  Clinical Data: Elevated D-dimer  CT ANGIOGRAPHY CHEST  Technique:  Multidetector CT imaging of the chest using the standard protocol during bolus administration of intravenous contrast. Multiplanar reconstructed  images including MIPs were obtained and reviewed to evaluate the vascular anatomy.  Contrast: OMNIPAQUE IOHEXOL 350 MG/ML SOLN  Comparison: None  Findings: No enlarged supraclavicular or axillary lymph nodes.  No enlarged mediastinal or hilar lymph nodes.  No pericardial or pleural effusion identified.  The pulmonary arteries appear patent.  There is no filling defects to suggest acute pulmonary embolus.  Atelectasis is noted within both lung bases.  No airspace consolidation identified.  No interstitial edema.  No suspicious pulmonary nodules or masses identified. Limited imaging through the upper abdomen shows stones within the lumen of the gallbladder.  IMPRESSION:  1.  No acute pulmonary embolus. 2.  Atelectasis within both lung bases.  Original Report Authenticated By: Rosealee Albee, M.D.    Assessment/Plan Present on Admission:  1. Syncope: most likely vaso vagal in nature, in view of the circumstances of the syncope.  But will admit him to rule out cardiogenic syncope. EKG shows NSR. Admit to tele Cardiac enzymes Repeat 2D echo Orthostatics on admit.  Gentle hydration.  2. Mild hypernatremia: -secondary  to dehydration. Gentle hydration with 1/2 normal saline  3. Wheezing on exam:  -asthma exacerbation Prednisone Nebs Oxygen as needed  4. DVT prophylaxis -lovenox.  Full code    Time spent on this patient including examination and decision-making process: 69 minutes.  Michaelene Dutan [phone] 01/22/2012, 7:06 PM

## 2012-01-23 LAB — COMPREHENSIVE METABOLIC PANEL
Albumin: 3.3 g/dL — ABNORMAL LOW (ref 3.5–5.2)
BUN: 13 mg/dL (ref 6–23)
Creatinine, Ser: 0.83 mg/dL (ref 0.50–1.35)
GFR calc Af Amer: >90 mL/min (ref >90)
Glucose, Bld: 142 mg/dL — ABNORMAL HIGH (ref 70–99)
Total Bilirubin: 0.4 mg/dL (ref 0.3–1.2)
Total Protein: 6.7 g/dL (ref 6.0–8.3)

## 2012-01-23 LAB — CARDIAC PANEL(CRET KIN+CKTOT+MB+TROPI)
CK, MB: 4.4 ng/mL — ABNORMAL HIGH (ref 0.3–4.0)
Relative Index: 3.3 — ABNORMAL HIGH (ref 0.0–2.5)
Troponin I: <0.3 ng/mL (ref <0.30)

## 2012-01-23 LAB — CBC
MCV: 91.6 fL (ref 78.0–100.0)
Platelets: 185 10*3/uL (ref 150–400)
RBC: 4.28 MIL/uL (ref 4.22–5.81)
WBC: 6.1 10*3/uL (ref 4.0–10.5)

## 2012-01-23 LAB — PROTIME-INR: INR: 1.13 (ref 0.00–1.49)

## 2012-01-23 LAB — APTT: aPTT: 31 seconds (ref 24–37)

## 2012-01-23 LAB — TSH: TSH: 0.877 u[IU]/mL (ref 0.350–4.500)

## 2012-01-23 NOTE — Discharge Summary (Signed)
Physician Discharge Summary  Patient ID: Thomas [name] MRN: [id number] DOB/AGE: September 04, 1940 71 y.o.  Admit date: 01/22/2012 Discharge date: 01/23/2012  Admission Diagnoses:  Discharge Diagnoses:  Principal Problem:  *Syncope-  Orthostatic BP- dehydration Low BP CT negative for PE/  Active Problems:  HYPERLIPIDEMIA-MIXED  HYPERTENSION, UNSPECIFIED  CORONARY HEART DISEASE  Allergic asthma  Esophageal reflux  Wheezing   Discharged Condition: good  Hospital Course: 9 with episode of syncope/ mild asthma Was working outside- pass out few seconds Syncope- telemetry normal sinus- EKG normal R/o MI marker negative BP- low ortho static- felt better with IVF Normal labs CT negative for PE. No PNA Will hold BP med for few day Recheck BP at office Asthma- continue same meds D/c home with f/u   Consults: None  Significant Diagnostic Studies: labs: normal all and radiology: CT scan: negative for PE  Treatments: IV hydration  Discharge Exam: Blood pressure 110/72, pulse 64, temperature 98.1 F (36.7 C), resp. rate 20, height 5' 8.5" (1.74 m), weight 95.2 kg (209 lb 14.1 oz), SpO2 94.00%. General appearance: alert Resp: clear to auscultation bilaterally Cardio: regular rate and rhythm  Disposition:   Discharge Orders    Future Appointments: Provider: Department: Dept Phone: Center:   03/30/2012 10:30 AM Waymon Budge, MD [facility] [phone] None     Future Orders Please Complete By Expires   Diet - low sodium heart healthy      Increase activity slowly        Medication List  As of 01/23/2012  9:26 AM   STOP taking these medications         lisinopril 10 MG tablet         TAKE these medications         albuterol 108 (90 BASE) MCG/ACT inhaler   Commonly known as: PROVENTIL HFA;VENTOLIN HFA   Inhale 2 puffs into the lungs every 6 (six) hours as needed. For wheezing.      aspirin EC 81 MG tablet   Take 81 mg by mouth daily.      cimetidine 200 MG  tablet   Commonly known as: TAGAMET   Take 200 mg by mouth 4 (four) times daily as needed. For acid reflux.      fexofenadine 180 MG tablet   Commonly known as: ALLEGRA   Take 180 mg by mouth daily.      mometasone-formoterol 100-5 MCG/ACT Aero   Commonly known as: DULERA   Inhale 2 puffs into the lungs 2 (two) times daily.      olsalazine 250 MG capsule   Commonly known as: DIPENTUM   Take 500 mg by mouth daily.      predniSONE 5 MG tablet   Commonly known as: DELTASONE   Take 5 mg by mouth daily as needed. For wheezing.           Follow-up Information    Follow up with Lillia Mountain, MD.   Contact information:   [address] [employer], [place]. [place] [address] [phone]          Signed: Georgann Housekeeper 01/23/2012, 9:26 AM

## 2012-01-27 NOTE — Progress Notes (Signed)
Utilization review completed.  

## 2012-02-17 ENCOUNTER — Other Ambulatory Visit: Payer: Self-pay | Admitting: Internal Medicine

## 2012-03-02 ENCOUNTER — Other Ambulatory Visit: Payer: Self-pay | Admitting: Internal Medicine

## 2012-03-30 ENCOUNTER — Other Ambulatory Visit (INDEPENDENT_AMBULATORY_CARE_PROVIDER_SITE_OTHER): Payer: Medicare Other

## 2012-03-30 ENCOUNTER — Ambulatory Visit: Payer: Medicare Other | Admitting: Internal Medicine

## 2012-03-30 ENCOUNTER — Ambulatory Visit (INDEPENDENT_AMBULATORY_CARE_PROVIDER_SITE_OTHER)
Admission: RE | Admit: 2012-03-30 | Discharge: 2012-03-30 | Disposition: A | Discharge status: Home / Self Care | Payer: Medicare Other | Source: Ambulatory Visit | Attending: Internal Medicine | Admitting: Internal Medicine

## 2012-03-30 ENCOUNTER — Ambulatory Visit (INDEPENDENT_AMBULATORY_CARE_PROVIDER_SITE_OTHER): Payer: Medicare Other | Admitting: Internal Medicine

## 2012-03-30 ENCOUNTER — Encounter: Payer: Self-pay | Admitting: Internal Medicine

## 2012-03-30 VITALS — BP 110/78 | HR 60 | Ht 68.5 in | Wt 218.6 lb

## 2012-03-30 DIAGNOSIS — M549 Dorsalgia, unspecified: Secondary | ICD-10-CM

## 2012-03-30 DIAGNOSIS — M546 Pain in thoracic spine: Secondary | ICD-10-CM

## 2012-03-30 DIAGNOSIS — J40 Bronchitis, not specified as acute or chronic: Secondary | ICD-10-CM

## 2012-03-30 DIAGNOSIS — J45909 Unspecified asthma, uncomplicated: Secondary | ICD-10-CM

## 2012-03-30 LAB — CBC WITH DIFFERENTIAL/PLATELET
Basophils Absolute: 0 10*3/uL (ref 0.0–0.1)
Eosinophils Absolute: 0.3 10*3/uL (ref 0.0–0.7)
HCT: 42.1 % (ref 39.0–52.0)
Hemoglobin: 14.3 g/dL (ref 13.0–17.0)
Lymphs Abs: 1.1 10*3/uL (ref 0.7–4.0)
MCHC: 33.9 g/dL (ref 30.0–36.0)
Monocytes Absolute: 0.5 10*3/uL (ref 0.1–1.0)
Neutro Abs: 4.4 10*3/uL (ref 1.4–7.7)
RDW: 14.1 % (ref 11.5–14.6)

## 2012-03-30 NOTE — Progress Notes (Signed)
Quick Note:  LMTCB on cell phone [phone] provided by wife. ______

## 2012-03-30 NOTE — Progress Notes (Signed)
Patient ID: Thomas [name], male    DOB: 07/31/1941, 71 y.o.   MRN: [id number]  HPI 03/31/11- 71 yoM former smoker followed for allergic rhinitis, asthma, colitis Last here October 22, 2010 New cough starting 1 month ago- dry , with tussive left back pain= annoying. Cough several times daily. No cause or pattern recognized. He has been on lisinopril from Dr Shirlee Latch started in May. Not short of breath. Albuterol rescue inhaler or nebulizer used every other night- do help. He asks about going back on maintenance prednisone for this. Took some from Dr Judie Petit. Laural Benes for colitis in June. CXR 06/15/10- stable and clear, NAD, old CABG  10/01/11- 71 yoM former smoker followed for allergic rhinitis, asthma, colitis Had flu vax and avoided any distinct resp inf this winter. Has had persistent nasal congestion, cough, drainage, white to yellow mucus x 1 month with no definite onset. Some wheeze. Using rescue inh ~1x/ day, dulera twice daily, neb ~ 1-2x/ month. Denies headache, fever, purulent or blood.  Persistent cough was not changed by using Benicar x 1 month so went back on lisinopril. Unlikely that cough is affected by ACE inhibitor. Feeling run down- asks permission to try multivitamin. Discussed ddx- he will first try mvi and discuss w/ PCP.   03/30/12- 71 yoM former smoker followed for allergic rhinitis, asthma, colitis Feels pain in Right lung area-backside; slight flare up with allergies due to weather changes. ? bronchitis-cough-productive-brown in color 4 or 5 weeks of pain under  right scapula-dull persistent ache. Brownish sputum, no fever "not sick". Not trying pain medication or heat. Legs okay. Tingling right supraclavicular area to right side of neck. Spent overnight in hospital for dehydration with syncope June 1, before this pain. CT chest 01/22/12 IMPRESSION:  1. No acute pulmonary embolus.  2. Atelectasis within both lung bases.  Original Report Authenticated By: Rosealee Albee, M.D.    Review of Systems Constitutional:   No-   weight loss, night sweats, fevers, chills, fatigue, +lassitude. HEENT:   No-  headaches, difficulty swallowing, tooth/dental problems, sore throat,       No-  sneezing, itching, ear ache,   +nasal congestion, post nasal drip,  CV:  + Atypical chest/ back  pain, orthopnea, PND, swelling in lower extremities, anasarca, dizziness, palpitations Resp: No- unusual  shortness of breath with exertion or at rest.                No-  coughing up of blood.              + change in color of mucus.  Some wheezing.   Skin: No-   rash or lesions. GI:  No- acute  heartburn, indigestion, abdominal pain, nausea, vomiting,  GU: MS:  No-   joint pain or swelling.    + back pain. Neuro- no-peripheral paresthesias  Psych:  No- change in mood or affect. No depression or anxiety.  No memory loss.   Objective:   Physical Exam General- Alert, Oriented, Affect-appropriate, Distress- none acute  overweight Skin- rash-none, lesions- none, excoriation- none. No sign of shingles on his back. Lymphadenopathy- none Head- atraumatic            Eyes- Gross vision intact, PERRLA, conjunctivae clear secretions  Chronic squint right eye            Ears- Hearing, canals normal            Nose- Clear,No- Septal dev, mucus, polyps, erosion, perforation  Throat- Mallampati III-IV , mucosa clear , drainage- none, tonsils- atrophic Neck- flexible , trachea midline, no stridor , thyroid nl, carotid no bruit Chest - symmetrical excursion , unlabored           Heart/CV- RRR , no murmur , no gallop  , no rub, nl s1 s2                           - JVD- none , edema- none, stasis changes- none, varices- none           Lung- +mild wheeze, dullness-none, rub- none, not coughing                              Chest wall- back is not tender Abd-  Br/ Gen/ Rectal- Not done, not indicated Extrem- cyanosis- none, clubbing, none, atrophy- none, strength- nl Neuro- grossly intact to  observation

## 2012-03-30 NOTE — Patient Instructions (Addendum)
Order- CXR  Dx bronchitis, Right back bain              CBC w/ diff, D-dimer, Sed rate   Try heating pad and Advil for the pain  Ask Dr Valentina Lucks about the tingling in your neck. It may be that arthritis changes in your spine might be causing both the tingling and your lower back pain.   Please call as needed

## 2012-03-31 ENCOUNTER — Telehealth: Payer: Self-pay | Admitting: Internal Medicine

## 2012-03-31 NOTE — Telephone Encounter (Signed)
Labs are ok. One test (D-dimer) is a little high. It can be a marker for blood clots, but it was higher 2 months ago, when tests were negative, so I don't think it is the cause of back pain now.  Notes Recorded by Waymon Budge, MD on 03/30/2012 at 1:31 PM CXR- clear with no active problem to explain pain.  I spoke with patient about results and he verbalized understanding and had no questions

## 2012-04-06 DIAGNOSIS — M549 Dorsalgia, unspecified: Secondary | ICD-10-CM | POA: Insufficient documentation

## 2012-04-06 NOTE — Assessment & Plan Note (Addendum)
Aching pain persisted for 5 weeks under right scapula with no corresponding lesion noted on recent CT scan. In association with his description of tingling paresthesias right neck, I wonder if this could be  nerve root irritation from thoracic spine disease. Plan-CBC, sedimentation rate, D. dimer, chest x-ray. Suggest Advil and heating pad. If pain persists consider bone scan.

## 2012-04-06 NOTE — Assessment & Plan Note (Signed)
Exacerbation of bronchitis suggested by a "brown" sputum will he does not feel he is acutely ill. I don't know if this is related to his infrascapular pain.

## 2012-05-29 ENCOUNTER — Other Ambulatory Visit: Payer: Self-pay | Admitting: Internal Medicine

## 2012-05-31 ENCOUNTER — Other Ambulatory Visit: Payer: Self-pay | Admitting: *Deleted

## 2012-05-31 MED ORDER — MOMETASONE FURO-FORMOTEROL FUM 100-5 MCG/ACT IN AERO: 2.0000 | INHALATION_SPRAY | Freq: Two times a day (BID) | RESPIRATORY_TRACT | Status: DC

## 2012-08-02 ENCOUNTER — Telehealth: Payer: Self-pay | Admitting: Internal Medicine

## 2012-08-02 MED ORDER — PREDNISONE 5 MG PO TABS: 5.0000 mg | ORAL_TABLET | Freq: Every day | ORAL | Status: DC | PRN

## 2012-08-02 NOTE — Telephone Encounter (Signed)
Prednisone 5mg  <>take 1 tablet by mouth daily if needed.  #30 x 1 Allergies  Allergen Reactions  . Aspirin Hives  . Codeine Hives  . Fluticasone-Salmeterol Hives  . Montelukast Sodium Hives  . Cephalexin Rash  . Ibuprofen Rash  . Penicillins Rash  . Statins Rash  . Sulfonamide Derivatives Rash   Dr Maple Hudson please advise Thank you

## 2012-08-02 NOTE — Telephone Encounter (Signed)
Per [facility]-ok[facility] to refill as requested. 

## 2012-10-06 ENCOUNTER — Ambulatory Visit: Payer: Medicare Other | Admitting: Internal Medicine

## 2012-11-03 ENCOUNTER — Other Ambulatory Visit: Payer: Self-pay | Admitting: Internal Medicine

## 2012-11-03 MED ORDER — PREDNISONE 5 MG PO TABS: 5.0000 mg | ORAL_TABLET | Freq: Every day | ORAL | Status: DC | PRN

## 2012-11-17 ENCOUNTER — Ambulatory Visit (INDEPENDENT_AMBULATORY_CARE_PROVIDER_SITE_OTHER): Payer: Medicare Other | Admitting: Internal Medicine

## 2012-11-17 ENCOUNTER — Encounter: Payer: Self-pay | Admitting: Internal Medicine

## 2012-11-17 VITALS — BP 122/76 | HR 74 | Ht 68.0 in | Wt 213.6 lb

## 2012-11-17 DIAGNOSIS — J330 Polyp of nasal cavity: Secondary | ICD-10-CM

## 2012-11-17 DIAGNOSIS — J45909 Unspecified asthma, uncomplicated: Secondary | ICD-10-CM

## 2012-11-17 MED ORDER — ALBUTEROL SULFATE (2.5 MG/3ML) 0.083% IN NEBU: 2.5000 mg | INHALATION_SOLUTION | RESPIRATORY_TRACT | Status: DC | PRN

## 2012-11-17 MED ORDER — COMPRESSOR/NEBULIZER MISC: 1.0000 | Freq: Four times a day (QID) | Status: DC | PRN

## 2012-11-17 NOTE — Progress Notes (Signed)
Patient ID: Thomas [name], male    DOB: Dec 22, 1940, 72 y.o.   MRN: [id number]  HPI 03/31/11- 72 yoM former smoker followed for allergic rhinitis, asthma, colitis Last here October 22, 2010 New cough starting 1 month ago- dry , with tussive left back pain= annoying. Cough several times daily. No cause or pattern recognized. He has been on lisinopril from Dr Shirlee Latch started in May. Not short of breath. Albuterol rescue inhaler or nebulizer used every other night- do help. He asks about going back on maintenance prednisone for this. Took some from Dr Judie Petit. Laural Benes for colitis in June. CXR 06/15/10- stable and clear, NAD, old CABG  10/01/11- 72 yoM former smoker followed for allergic rhinitis, asthma, colitis Had flu vax and avoided any distinct resp inf this winter. Has had persistent nasal congestion, cough, drainage, white to yellow mucus x 1 month with no definite onset. Some wheeze. Using rescue inh ~1x/ day, dulera twice daily, neb ~ 1-2x/ month. Denies headache, fever, purulent or blood.  Persistent cough was not changed by using Benicar x 1 month so went back on lisinopril. Unlikely that cough is affected by ACE inhibitor. Feeling run down- asks permission to try multivitamin. Discussed ddx- he will first try mvi and discuss w/ PCP.   03/30/12- 72 yoM former smoker followed for allergic rhinitis, asthma, colitis Feels pain in Right lung area-backside; slight flare up with allergies due to weather changes. ? bronchitis-cough-productive-brown in color 4 or 5 weeks of pain under  right scapula-dull persistent ache. Brownish sputum, no fever "not sick". Not trying pain medication or heat. Legs okay. Tingling right supraclavicular area to right side of neck. Spent overnight in hospital for dehydration with syncope June 1, before this pain. CT chest 01/22/12 IMPRESSION:  1. No acute pulmonary embolus.  2. Atelectasis within both lung bases.  Original Report Authenticated By: Rosealee Albee, M.D.     11/17/12- 72 yoM former smoker followed for allergic rhinitis, asthma, colitis FOLLOWS FOR: slight flare up at this time but using Allegra and getting relief. Minor cough comes and goes. No problems at with spring pollen as he continues Allegra 180. Asks smaller nebulizer machine for travel CXR 03/31/12 IMPRESSION:  There is no evidence of acute cardiac or pulmonary process.  Original Report Authenticated By: Brandon Melnick, M.D.  Review of Systems- see HPI Constitutional:   No-   weight loss, night sweats, fevers, chills, fatigue, +lassitude. HEENT:   No-  headaches, difficulty swallowing, tooth/dental problems, sore throat,       No-  sneezing, itching, ear ache,   +nasal congestion, post nasal drip,  CV:  , no-orthopnea, PND, swelling in lower extremities, anasarca, dizziness, palpitations Resp: No- unusual  shortness of breath with exertion or at rest.                No-  coughing up of blood.              + change in color of mucus.  Some wheezing.   Skin: No-   rash or lesions. GI:  No- acute  heartburn, indigestion, abdominal pain, nausea, vomiting,  GU: MS:  No-   joint pain or swelling.    + back pain. Neuro- no-peripheral paresthesias  Psych:  No- change in mood or affect. No depression or anxiety.  No memory loss.   Objective:   Physical Exam General- Alert, Oriented, Affect-appropriate, Distress- none acute  Overweight. Looking very well Skin- rash-none, lesions- none, excoriation- none.  No sign of shingles on his back. Lymphadenopathy- none Head- atraumatic            Eyes- Gross vision intact, PERRLA, conjunctivae clear secretions  Chronic squint right eye            Ears- Hearing, canals normal            Nose- Clear,No- Septal dev, mucus, polyps, erosion, perforation             Throat- Mallampati III-IV , mucosa clear , drainage- none, tonsils- atrophic Neck- flexible , trachea midline, no stridor , thyroid nl, carotid no bruit Chest - symmetrical excursion ,  unlabored           Heart/CV- RRR , no murmur , no gallop  , no rub, nl s1 s2                           - JVD- none , edema- none, stasis changes- none, varices- none           Lung- +mild wheeze in bases, dullness-none, rub- none, not coughing                              Chest wall- back is not tender Abd- protuberant Br/ Gen/ Rectal- Not done, not indicated Extrem- cyanosis- none, clubbing, none, atrophy- none, strength- nl Neuro- grossly intact to observation

## 2012-11-17 NOTE — Patient Instructions (Addendum)
Script for nebulizer compressor and for albuterol nebulizer solution. Our [facility] can put you in touch with a DME company that can process these through Medicare for you. You can ask them about small units for travel. You could also see if a chain drug store might carry what you need.

## 2012-11-24 NOTE — Assessment & Plan Note (Signed)
Chronic asthma with bronchitis. Plan-discussed working through a DME company to get Medicare part B coverage for nebulizer with albuterol

## 2012-11-24 NOTE — Assessment & Plan Note (Signed)
No polyps seen at this examination

## 2012-12-28 ENCOUNTER — Other Ambulatory Visit: Payer: Self-pay | Admitting: Internal Medicine

## 2012-12-28 MED ORDER — ALBUTEROL SULFATE HFA 108 (90 BASE) MCG/ACT IN AERS: 2.0000 | INHALATION_SPRAY | Freq: Four times a day (QID) | RESPIRATORY_TRACT | Status: DC | PRN

## 2013-03-19 ENCOUNTER — Other Ambulatory Visit: Payer: Self-pay | Admitting: Internal Medicine

## 2013-03-19 MED ORDER — PREDNISONE 5 MG PO TABS: 5.0000 mg | ORAL_TABLET | Freq: Every day | ORAL | Status: DC | PRN

## 2013-06-01 ENCOUNTER — Ambulatory Visit (INDEPENDENT_AMBULATORY_CARE_PROVIDER_SITE_OTHER): Payer: Medicare Other | Admitting: Internal Medicine

## 2013-06-01 ENCOUNTER — Encounter: Payer: Self-pay | Admitting: Internal Medicine

## 2013-06-01 VITALS — BP 132/68 | HR 60 | Ht 68.0 in | Wt 218.2 lb

## 2013-06-01 DIAGNOSIS — J45909 Unspecified asthma, uncomplicated: Secondary | ICD-10-CM

## 2013-06-01 DIAGNOSIS — J330 Polyp of nasal cavity: Secondary | ICD-10-CM

## 2013-06-01 MED ORDER — MOMETASONE FURO-FORMOTEROL FUM 100-5 MCG/ACT IN AERO: 2.0000 | INHALATION_SPRAY | Freq: Two times a day (BID) | RESPIRATORY_TRACT | Status: DC

## 2013-06-01 MED ORDER — AZITHROMYCIN 250 MG PO TABS: 250.0000 mg | ORAL_TABLET | Freq: Every day | ORAL | Status: DC

## 2013-06-01 NOTE — Patient Instructions (Signed)
Script printed for Zpak to hold in case needed  Script for [employer]

## 2013-06-01 NOTE — Progress Notes (Signed)
Patient ID: Thomas [name], male    DOB: [birth year], 72 y.o.   MRN: [id number]  HPI 03/31/11- 72 yoM former smoker followed for allergic rhinitis, asthma, colitis Last here October 22, 2010 New cough starting 1 month ago- dry , with tussive left back pain= annoying. Cough several times daily. No cause or pattern recognized. He has been on lisinopril from Dr Shirlee Latch started in May. Not short of breath. Albuterol rescue inhaler or nebulizer used every other night- do help. He asks about going back on maintenance prednisone for this. Took some from Dr Judie Petit. Laural Benes for colitis in June. CXR 06/15/10- stable and clear, NAD, old CABG  10/01/11- 72 yoM former smoker followed for allergic rhinitis, asthma, colitis Had flu vax and avoided any distinct resp inf this winter. Has had persistent nasal congestion, cough, drainage, white to yellow mucus x 1 month with no definite onset. Some wheeze. Using rescue inh ~1x/ day, dulera twice daily, neb ~ 1-2x/ month. Denies headache, fever, purulent or blood.  Persistent cough was not changed by using Benicar x 1 month so went back on lisinopril. Unlikely that cough is affected by ACE inhibitor. Feeling run down- asks permission to try multivitamin. Discussed ddx- he will first try mvi and discuss w/ PCP.   03/30/12- 72 yoM former smoker followed for allergic rhinitis, asthma, colitis Feels pain in Right lung area-backside; slight flare up with allergies due to weather changes. ? bronchitis-cough-productive-brown in color 4 or 5 weeks of pain under  right scapula-dull persistent ache. Brownish sputum, no fever "not sick". Not trying pain medication or heat. Legs okay. Tingling right supraclavicular area to right side of neck. Spent overnight in hospital for dehydration with syncope June 1, before this pain. CT chest 01/22/12 IMPRESSION:  1. No acute pulmonary embolus.  2. Atelectasis within both lung bases.  Original Report Authenticated By: Rosealee Albee, M.D.    11/17/12- 72 yoM former smoker followed for allergic rhinitis, asthma, colitis FOLLOWS FOR: slight flare up at this time but using Allegra and getting relief. Minor cough comes and goes. No problems at with spring pollen as he continues Allegra 180. Asks smaller nebulizer machine for travel CXR 03/31/12 IMPRESSION:  There is no evidence of acute cardiac or pulmonary process.  Original Report Authenticated By: Brandon Melnick, M.D.  06/01/13- 72 yoM former smoker followed for allergic rhinitis, asthma, colitis FOLLOWS FOR:  cough x 2-3 days with brownish sputum.  no other symptoms.  Acute bronchitis, 3 days increased cough, brownish sputum. No fever or sore throat. Had flu vaccine  Review of Systems- see HPI Constitutional:   No-   weight loss, night sweats, fevers, chills, fatigue, +lassitude. HEENT:   No-  headaches, difficulty swallowing, tooth/dental problems, sore throat,       No-  sneezing, itching, ear ache,   +nasal congestion, post nasal drip,  CV:  , no-orthopnea, PND, swelling in lower extremities, anasarca, dizziness, palpitations Resp: No- unusual  shortness of breath with exertion or at rest.  + productive cough              No-  coughing up of blood.              + change in color of mucus.  Some wheezing.   Skin: No-   rash or lesions. GI:  No- acute  heartburn, indigestion, abdominal pain, nausea, vomiting,  GU: MS:  No-   joint pain or swelling.    + back pain. Neuro-  no-peripheral paresthesias  Psych:  No- change in mood or affect. No depression or anxiety.  No memory loss.   Objective:   Physical Exam General- Alert, Oriented, Affect-appropriate, Distress- none acute  Overweight.  Skin- rash-none, lesions- none, excoriation- none.  Lymphadenopathy- none Head- atraumatic            Eyes- Gross vision intact, PERRLA, conjunctivae clear secretions  Chronic squint right eye            Ears- Hearing, canals normal            Nose- Clear,No- Septal dev, mucus,  polyps, erosion, perforation             Throat- Mallampati III-IV , mucosa clear , drainage- none, tonsils- atrophic, + dentures Neck- flexible , trachea midline, no stridor , thyroid nl, carotid no bruit Chest - symmetrical excursion , unlabored           Heart/CV- RRR , no murmur , no gallop  , no rub, nl s1 s2                           - JVD- none , edema- none, stasis changes- none, varices- none           Lung- +mild wheeze in bases, dullness-none, rub- none, not coughing                              Chest wall-  Abd- protuberant Br/ Gen/ Rectal- Not done, not indicated Extrem- cyanosis- none, clubbing, none, atrophy- none, strength- nl Neuro- grossly intact to observation

## 2013-06-17 ENCOUNTER — Encounter: Payer: Self-pay | Admitting: Internal Medicine

## 2013-06-17 NOTE — Assessment & Plan Note (Signed)
None seen on this visit

## 2013-06-17 NOTE — Assessment & Plan Note (Signed)
Acute bronchitis. We discussed indications for antibiotics, especially worsening productive cough, fever, shortness of breath. Plan-Z-Pak to hold, refill Dulera, increase fluids

## 2013-08-03 ENCOUNTER — Telehealth: Payer: Self-pay | Admitting: Internal Medicine

## 2013-08-03 NOTE — Telephone Encounter (Signed)
lmomtcb x1 

## 2013-08-06 MED ORDER — PREDNISONE 10 MG PO TABS: ORAL_TABLET | ORAL | Status: DC

## 2013-08-06 MED ORDER — DOXYCYCLINE HYCLATE 100 MG PO TABS: ORAL_TABLET | ORAL | Status: DC

## 2013-08-06 NOTE — Telephone Encounter (Signed)
Returning call.

## 2013-08-06 NOTE — Telephone Encounter (Signed)
Returning call can be reached at [phone].Thomas [name]

## 2013-08-06 NOTE — Telephone Encounter (Signed)
LMTCBx1. No rx has been sent until speak with the pt. Carron Curie, CMA

## 2013-08-06 NOTE — Telephone Encounter (Signed)
lmtcb x1 

## 2013-08-06 NOTE — Telephone Encounter (Signed)
I called # and was advised pt has left and to call [phone] I called and LMTCB X1

## 2013-08-06 NOTE — Telephone Encounter (Signed)
Last OV 06-01-13. Pt is c/o having productive cough with yellow phlegm, chest congestion, wheezing x 2 weeks. Pt denies any sinus congestion, fever, chest tightness, SOB at this time. Please advise. Carron Curie, CMA Allergies  Allergen Reactions  . Aspirin Hives  . Codeine Hives  . Fluticasone-Salmeterol Hives  . Montelukast Sodium Hives  . Cephalexin Rash  . Ibuprofen Rash  . Penicillins Rash  . Statins Rash  . Sulfonamide Derivatives Rash

## 2013-08-06 NOTE — Telephone Encounter (Signed)
Offer doxycycline 100 mg, # 8, 2 today then one daily. May have pred 10 mg# 20, 4 X 2 DAYS, 3 X 2 DAYS, 2 X 2 DAYS, 1 X 2 DAYS  If he thinks he needs it. Office work in with TP or me if I have space and he thinks he needs, later this week.

## 2013-08-06 NOTE — Telephone Encounter (Signed)
Called spoke with patient and advised of CDY's recommendations for Doxycycline and pred taper >> pt okay with these recommendations and verbalized his understanding.  Rather than scheduling an appt now for appt later this week, pt stated that he will call if his symptoms do not improve for work-in ov.  Rx's sent to verified pharmacy Nothing further needed; will sign off.

## 2013-09-18 ENCOUNTER — Telehealth: Payer: Self-pay | Admitting: Internal Medicine

## 2013-09-18 MED ORDER — PREDNISONE 5 MG PO TABS: 5.0000 mg | ORAL_TABLET | Freq: Every day | ORAL | Status: DC | PRN

## 2013-09-18 MED ORDER — ALBUTEROL SULFATE HFA 108 (90 BASE) MCG/ACT IN AERS: 2.0000 | INHALATION_SPRAY | Freq: Four times a day (QID) | RESPIRATORY_TRACT | Status: DC | PRN

## 2013-09-18 NOTE — Telephone Encounter (Signed)
Rx's have been sent in. Pt is aware. 

## 2013-10-01 ENCOUNTER — Encounter (HOSPITAL_COMMUNITY): Payer: Self-pay | Admitting: Emergency Medicine

## 2013-10-01 ENCOUNTER — Emergency Department (HOSPITAL_COMMUNITY): Payer: Medicare Other

## 2013-10-01 ENCOUNTER — Emergency Department (HOSPITAL_COMMUNITY)
Admission: EM | Admit: 2013-10-01 | Discharge: 2013-10-01 | Disposition: A | Discharge status: Home / Self Care | Payer: Medicare Other | Attending: Emergency Medicine | Admitting: Emergency Medicine

## 2013-10-01 DIAGNOSIS — M5430 Sciatica, unspecified side: Secondary | ICD-10-CM | POA: Insufficient documentation

## 2013-10-01 DIAGNOSIS — Z8612 Personal history of poliomyelitis: Secondary | ICD-10-CM | POA: Insufficient documentation

## 2013-10-01 DIAGNOSIS — Z8719 Personal history of other diseases of the digestive system: Secondary | ICD-10-CM | POA: Insufficient documentation

## 2013-10-01 DIAGNOSIS — Z79899 Other long term (current) drug therapy: Secondary | ICD-10-CM | POA: Insufficient documentation

## 2013-10-01 DIAGNOSIS — Z951 Presence of aortocoronary bypass graft: Secondary | ICD-10-CM | POA: Insufficient documentation

## 2013-10-01 DIAGNOSIS — E669 Obesity, unspecified: Secondary | ICD-10-CM | POA: Insufficient documentation

## 2013-10-01 DIAGNOSIS — IMO0002 Reserved for concepts with insufficient information to code with codable children: Secondary | ICD-10-CM | POA: Insufficient documentation

## 2013-10-01 DIAGNOSIS — IMO0001 Reserved for inherently not codable concepts without codable children: Secondary | ICD-10-CM

## 2013-10-01 DIAGNOSIS — Z9889 Other specified postprocedural states: Secondary | ICD-10-CM | POA: Insufficient documentation

## 2013-10-01 DIAGNOSIS — Z8669 Personal history of other diseases of the nervous system and sense organs: Secondary | ICD-10-CM | POA: Insufficient documentation

## 2013-10-01 DIAGNOSIS — I2510 Atherosclerotic heart disease of native coronary artery without angina pectoris: Secondary | ICD-10-CM | POA: Insufficient documentation

## 2013-10-01 DIAGNOSIS — Z7982 Long term (current) use of aspirin: Secondary | ICD-10-CM | POA: Insufficient documentation

## 2013-10-01 DIAGNOSIS — Z87891 Personal history of nicotine dependence: Secondary | ICD-10-CM | POA: Insufficient documentation

## 2013-10-01 DIAGNOSIS — Z880 Allergy status to penicillin: Secondary | ICD-10-CM | POA: Insufficient documentation

## 2013-10-01 DIAGNOSIS — I10 Essential (primary) hypertension: Secondary | ICD-10-CM | POA: Insufficient documentation

## 2013-10-01 DIAGNOSIS — J45909 Unspecified asthma, uncomplicated: Secondary | ICD-10-CM | POA: Insufficient documentation

## 2013-10-01 DIAGNOSIS — Z91040 Latex allergy status: Secondary | ICD-10-CM | POA: Insufficient documentation

## 2013-10-01 LAB — BASIC METABOLIC PANEL
BUN: 20 mg/dL (ref 6–23)
CHLORIDE: 109 meq/L (ref 96–112)
CO2: 21 meq/L (ref 19–32)
CREATININE: 0.91 mg/dL (ref 0.50–1.35)
Calcium: 9 mg/dL (ref 8.4–10.5)
GFR calc non Af Amer: 82 mL/min — ABNORMAL LOW (ref >90)
Glucose, Bld: 109 mg/dL — ABNORMAL HIGH (ref 70–99)
POTASSIUM: 4.4 meq/L (ref 3.7–5.3)
Sodium: 143 mEq/L (ref 137–147)

## 2013-10-01 LAB — CBC WITH DIFFERENTIAL/PLATELET
BASOS PCT: 0 % (ref 0–1)
Basophils Absolute: 0 10*3/uL (ref 0.0–0.1)
Eosinophils Absolute: 0.3 10*3/uL (ref 0.0–0.7)
Eosinophils Relative: 4 % (ref 0–5)
HEMATOCRIT: 38.7 % — AB (ref 39.0–52.0)
HEMOGLOBIN: 13.3 g/dL (ref 13.0–17.0)
LYMPHS PCT: 19 % (ref 12–46)
Lymphs Abs: 1.5 10*3/uL (ref 0.7–4.0)
MCH: 31.7 pg (ref 26.0–34.0)
MCHC: 34.4 g/dL (ref 30.0–36.0)
MCV: 92.1 fL (ref 78.0–100.0)
MONO ABS: 0.5 10*3/uL (ref 0.1–1.0)
MONOS PCT: 6 % (ref 3–12)
NEUTROS ABS: 5.8 10*3/uL (ref 1.7–7.7)
NEUTROS PCT: 71 % (ref 43–77)
Platelets: 224 10*3/uL (ref 150–400)
RBC: 4.2 MIL/uL — AB (ref 4.22–5.81)
RDW: 14.2 % (ref 11.5–15.5)
WBC: 8.2 10*3/uL (ref 4.0–10.5)

## 2013-10-01 LAB — PROTIME-INR
INR: 1.11 (ref 0.00–1.49)
PROTHROMBIN TIME: 14.1 s (ref 11.6–15.2)

## 2013-10-01 LAB — APTT: APTT: 27 s (ref 24–37)

## 2013-10-01 MED ORDER — PREDNISONE 20 MG PO TABS
60.0000 mg | ORAL_TABLET | Freq: Once | ORAL | Status: AC
  Administered 2013-10-01: 60 mg via ORAL
  Filled 2013-10-01: qty 3

## 2013-10-01 MED ORDER — OXYCODONE-ACETAMINOPHEN 5-325 MG PO TABS
1.0000 | ORAL_TABLET | Freq: Four times a day (QID) | ORAL | Status: DC | PRN
Start: 2013-10-01 — End: 2013-11-08

## 2013-10-01 MED ORDER — DIAZEPAM 5 MG/ML IJ SOLN
2.5000 mg | Freq: Once | INTRAMUSCULAR | Status: AC
  Administered 2013-10-01: 2.5 mg via INTRAVENOUS
  Filled 2013-10-01: qty 2

## 2013-10-01 MED ORDER — MORPHINE SULFATE 4 MG/ML IJ SOLN
4.0000 mg | Freq: Once | INTRAMUSCULAR | Status: AC
Start: 2013-10-01 — End: 2013-10-01
  Administered 2013-10-01: 4 mg via INTRAVENOUS
  Filled 2013-10-01: qty 1

## 2013-10-01 MED ORDER — FENTANYL CITRATE 0.05 MG/ML IJ SOLN
50.0000 ug | Freq: Once | INTRAMUSCULAR | Status: AC
  Administered 2013-10-01: 50 ug via INTRAVENOUS
  Filled 2013-10-01: qty 2

## 2013-10-01 MED ORDER — CYCLOBENZAPRINE HCL 10 MG PO TABS: 10.0000 mg | ORAL_TABLET | Freq: Three times a day (TID) | ORAL | Status: DC | PRN

## 2013-10-01 MED ORDER — PREDNISONE 20 MG PO TABS: 60.0000 mg | ORAL_TABLET | Freq: Every day | ORAL | Status: DC

## 2013-10-01 MED ORDER — MORPHINE SULFATE 4 MG/ML IJ SOLN
4.0000 mg | Freq: Once | INTRAMUSCULAR | Status: AC
  Administered 2013-10-01: 4 mg via INTRAVENOUS
  Filled 2013-10-01: qty 1

## 2013-10-01 MED ORDER — IOHEXOL 350 MG/ML SOLN
100.0000 mL | Freq: Once | INTRAVENOUS | Status: AC | PRN
  Administered 2013-10-01: 100 mL via INTRAVENOUS

## 2013-10-01 MED ORDER — ONDANSETRON HCL 4 MG/2ML IJ SOLN
4.0000 mg | Freq: Once | INTRAMUSCULAR | Status: AC
  Administered 2013-10-01: 4 mg via INTRAVENOUS
  Filled 2013-10-01: qty 2

## 2013-10-01 NOTE — ED Notes (Signed)
Patient transported to CT 

## 2013-10-01 NOTE — ED Provider Notes (Signed)
CSN: [id number]     Arrival date & time 10/01/13  4098 History   First MD Initiated Contact with Patient 10/01/13 0720     Chief Complaint  Patient presents with  . Back Pain   (Consider location/radiation/quality/duration/timing/severity/associated sxs/prior Treatment) Patient is a 73 y.o. male presenting with back pain.  Back Pain  Pt with history of lumbar disc herniation and surgical repair reports onset of severe lower back pain, radiating down his L leg after sneezing yesterday. Pain is worse with movement but able to walk short distances. No incontinence, numbness or weakness.   Past Medical History  Diagnosis Date  . Allergic rhinitis   . Allergic asthma   . Polio     left leg  . GERD (gastroesophageal reflux disease)   . 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 HLD (hyperlipidemia)   . HTN (hypertension)   . Colitis   . Obesity   . Pupil irregularity, right     old injury   Past Surgical History  Procedure Laterality Date  . Coronary artery bypass graft  3/06  . Total knee arthroplasty    . Cataract extraction    . Eye surgery      rebuild eye socket (R)  . Nasal sinus surgery    . Lumbar disc surgery  2011   Family History  Problem Relation Age of Onset  . Alzheimer's disease Mother   . Liver disease Sister   . Lung cancer Brother    History  Substance Use Topics  . Smoking status: Former Smoker -- 1.00 packs/day for 37 years    Types: Cigarettes    Quit date: 08/23/1992  . Smokeless tobacco: Never Used  . Alcohol Use: Yes     Comment: socially     Review of Systems  Musculoskeletal: Positive for back pain.   All other systems reviewed and are negative except as noted in HPI.   Allergies  Aspirin; Codeine; Fluticasone-salmeterol; Montelukast sodium; Adhesive; Cephalexin; Ibuprofen; Latex; Penicillins; Statins; and Sulfonamide derivatives  Home Medications   Current Outpatient Rx  Name  Route  Sig  Dispense  Refill  . albuterol  (PROAIR HFA) 108 (90 BASE) MCG/ACT inhaler   Inhalation   Inhale 2 puffs into the lungs every 6 (six) hours as needed for wheezing or shortness of breath.   1 Inhaler   5   . aspirin EC 81 MG tablet   Oral   Take 81 mg by mouth daily.         . cyclobenzaprine (FLEXERIL) 10 MG tablet   Oral   Take 10 mg by mouth at bedtime as needed for muscle spasms.         . fexofenadine (ALLEGRA) 180 MG tablet   Oral   Take 180 mg by mouth daily.           Marland Kitchen lisinopril (PRINIVIL,ZESTRIL) 10 MG tablet   Oral   Take 5 mg by mouth daily.         . mometasone-formoterol (DULERA) 100-5 MCG/ACT AERO   Inhalation   Inhale 2 puffs into the lungs 2 (two) times daily. Rinse mouth   1 Inhaler   prn   . olsalazine (DIPENTUM) 250 MG capsule   Oral   Take 2,000 mg by mouth daily.          Marland Kitchen albuterol (PROVENTIL) (2.5 MG/3ML) 0.083% nebulizer solution   Nebulization   Take 3 mLs (2.5 mg total) by nebulization  every 4 (four) hours as needed for wheezing or shortness of breath.   150 mL   12    BP 125/75  Pulse 67  Temp(Src) 97.4 F (36.3 C) (Oral)  Resp 20  SpO2 95% Physical Exam  Nursing note and vitals reviewed. Constitutional: He is oriented to person, place, and time. He appears well-developed and well-nourished.  HENT:  Head: Normocephalic and atraumatic.  Eyes: EOM are normal. Pupils are equal, round, and reactive to light.  Neck: Normal range of motion. Neck supple.  Cardiovascular: Normal rate, normal heart sounds and intact distal pulses.   Pulmonary/Chest: Effort normal and breath sounds normal.  Abdominal: Bowel sounds are normal. He exhibits no distension. There is no tenderness.  Musculoskeletal: Normal range of motion. He exhibits tenderness (L lumbar paraspinal muscles). He exhibits no edema.  Neurological: He is alert and oriented to person, place, and time. He has normal strength. No cranial nerve deficit or sensory deficit.  Skin: Skin is warm and dry. No rash  noted.  Psychiatric: He has a normal mood and affect.    ED Course  Procedures (including critical care time) Labs Review Labs Reviewed  CBC WITH DIFFERENTIAL - Abnormal; Notable for the following:    RBC 4.20 (*)    HCT 38.7 (*)    All other components within normal limits  BASIC METABOLIC PANEL - Abnormal; Notable for the following:    Glucose, Bld 109 (*)    GFR calc non Af Amer 82 (*)    All other components within normal limits  PROTIME-INR  APTT   Imaging Review Dg Lumbar Spine Complete  10/01/2013   CLINICAL DATA:  Low back and left leg pain, no injury  EXAM: LUMBAR SPINE - COMPLETE 4+ VIEW  COMPARISON:  MRI lumbar spine 05/25/2010  FINDINGS: Six non-rib-bearing lumbar type segments.  On previous MR, the last independent segment was labeled as a lumbarized S1 segment.  Mild diffuse osseous demineralization.  Multilevel disc space narrowing and endplate spur formation.  Vertebral body heights maintained without fracture or subluxation.  No bone destruction or spondylolysis.  SI joints symmetric.  Scattered atherosclerotic calcification of aorta and iliac arteries.  Aneurysmal dilatation of the abdominal aorta at L4, measuring 5.0 cm AP though this does contain inherent radiographic magnification.  IMPRESSION: Mild scattered degenerative disc disease changes of the lumbar spine as above.  No acute osseous abnormalities.  Aneurysmal dilatation of the abdominal aorta measuring 5.0 cm AP though this does contain inherent radiographic magnification; aorta measured 3.9 cm AP on a prior CT exam 05/11/2010. Recommend followup aortic ultrasound to assess for interval change.   Electronically Signed   By: Ulyses SouthwardMark  Boles M.D.   On: 10/01/2013 08:13   Ct Angio Abd/pel W/ And/or W/o  10/01/2013   CLINICAL DATA:  Back pain and history of abdominal aortic aneurysm.  EXAM: CT ANGIOGRAPHY ABDOMEN AND PELVIS WITH CONTRAST AND WITHOUT CONTRAST  TECHNIQUE: Multidetector CT imaging of the abdomen and pelvis was  performed using the standard protocol during bolus administration of intravenous contrast. Multiplanar reconstructed images and MIPs were obtained and reviewed to evaluate the vascular anatomy.  CONTRAST:  100mL OMNIPAQUE IOHEXOL 350 MG/ML SOLN  COMPARISON:  CT ABD/PELVIS W CM dated 05/11/2010; DG LUMBAR SPINE COMPLETE dated 10/01/2013  FINDINGS: There is mild enlargement of a non ruptured infrarenal abdominal aortic aneurysm with maximal dilatation just below the level of the inferior mesenteric artery origin. Maximum AP diameter is 4.2 cm and with is 3.5 cm. No  associated dissection. 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  Visceral arteries show stable patency including the celiac axis, superior mesenteric artery, bilateral renal arteries and inferior mesenteric artery. 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 Both kidneys are supply by paired renal arteries which demonstrate no significant stenosis. Heavily calcified iliac arteries present bilaterally without evidence of significant stenosis or aneurysmal disease. The common femoral arteries are calcified and show no significant stenosis.  Layering calcified gallstones are identified and appear stable. No associated biliary dilatation. The liver shows fatty infiltration. No masses, enlarged lymph nodes or hernias are identified. The pancreas, spleen, adrenal glands and kidneys are unremarkable. Bowel loops show no gross abnormalities. The lumbar spine shows mild degenerative changes without evidence of fracture or subluxation. No bony lesions or destruction identified. The bladder is unremarkable.  Review of the MIP images confirms the above findings.  IMPRESSION: Mild enlargement of nonruptured infrarenal abdominal aortic aneurysm now measuring 4.2 cm in greatest diameter compared to 3.9 cm in 2011. No acute findings in the abdomen or pelvis.   Electronically Signed   By: Irish Lack M.D.   On: 10/01/2013 11:59     EKG Interpretation   None       MDM   Final diagnoses:  Sciatica      Pt with know history of AAA, being followed by PCP. Given pain in low back and ?increase in size on xray, will check labs and send for CT angio aorta.   2:04 PM CT neg for AAA rupture, no significant change in size. Doubt this pain is related to AAA. Pain improved with meds in the ED. Suspect sciatica, advised followup with Neurosurg. PCP followup as needed.     Charles B. Bernette Mayers, MD 10/01/13 [phone]

## 2013-10-01 NOTE — ED Notes (Signed)
Dr. Bernette MayersSheldon at bedside explaining CT results

## 2013-10-01 NOTE — ED Notes (Signed)
Per EMS: Pt c/o back pain; pt has hx of chronic back pain with herniated disc; Pt has has back surgery in the past.

## 2013-10-01 NOTE — ED Notes (Signed)
Ambulated patient down the hall with assistance. Patient tolerated ambulation well with some pain, but stated it was much better than earlier today.

## 2013-10-01 NOTE — Discharge Instructions (Signed)
Sciatica °Sciatica is pain, weakness, numbness, or tingling along the path of the sciatic nerve. The nerve starts in the lower back and runs down the back of each leg. The nerve controls the muscles in the lower leg and in the back of the knee, while also providing sensation to the back of the thigh, lower leg, and the sole of your foot. Sciatica is a symptom of another medical condition. For instance, nerve damage or certain conditions, such as a herniated disk or bone spur on the spine, pinch or put pressure on the sciatic nerve. This causes the pain, weakness, or other sensations normally associated with sciatica. Generally, sciatica only affects one side of the body. °CAUSES  °· Herniated or slipped disc. °· Degenerative disk disease. °· A pain disorder involving the narrow muscle in the buttocks (piriformis syndrome). °· Pelvic injury or fracture. °· Pregnancy. °· Tumor (rare). °SYMPTOMS  °Symptoms can vary from mild to very severe. The symptoms usually travel from the low back to the buttocks and down the back of the leg. Symptoms can include: °· Mild tingling or dull aches in the lower back, leg, or hip. °· Numbness in the back of the calf or sole of the foot. °· Burning sensations in the lower back, leg, or hip. °· Sharp pains in the lower back, leg, or hip. °· Leg weakness. °· Severe back pain inhibiting movement. °These symptoms may get worse with coughing, sneezing, laughing, or prolonged sitting or standing. Also, being overweight may worsen symptoms. °DIAGNOSIS  °Your caregiver will perform a physical exam to look for common symptoms of sciatica. He or she may ask you to do certain movements or activities that would trigger sciatic nerve pain. Other tests may be performed to find the cause of the sciatica. These may include: °· Blood tests. °· X-rays. °· Imaging tests, such as an MRI or CT scan. °TREATMENT  °Treatment is directed at the cause of the sciatic pain. Sometimes, treatment is not necessary  and the pain and discomfort goes away on its own. If treatment is needed, your caregiver may suggest: °· Over-the-counter medicines to relieve pain. °· Prescription medicines, such as anti-inflammatory medicine, muscle relaxants, or narcotics. °· Applying heat or ice to the painful area. °· Steroid injections to lessen pain, irritation, and inflammation around the nerve. °· Reducing activity during periods of pain. °· Exercising and stretching to strengthen your abdomen and improve flexibility of your spine. Your caregiver may suggest losing weight if the extra weight makes the back pain worse. °· Physical therapy. °· Surgery to eliminate what is pressing or pinching the nerve, such as a bone spur or part of a herniated disk. °HOME CARE INSTRUCTIONS  °· Only take over-the-counter or prescription medicines for pain or discomfort as directed by your caregiver. °· Apply ice to the affected area for 20 minutes, 3 4 times a day for the first 48 72 hours. Then try heat in the same way. °· Exercise, stretch, or perform your usual activities if these do not aggravate your pain. °· Attend physical therapy sessions as directed by your caregiver. °· Keep all follow-up appointments as directed by your caregiver. °· Do not wear high heels or shoes that do not provide proper support. °· Check your mattress to see if it is too soft. A firm mattress may lessen your pain and discomfort. °SEEK IMMEDIATE MEDICAL CARE IF:  °· You lose control of your bowel or bladder (incontinence). °· You have increasing weakness in the lower back,   pelvis, buttocks, or legs. °· You have redness or swelling of your back. °· You have a burning sensation when you urinate. °· You have pain that gets worse when you lie down or awakens you at night. °· Your pain is worse than you have experienced in the past. °· Your pain is lasting longer than 4 weeks. °· You are suddenly losing weight without reason. °MAKE SURE YOU: °· Understand these  instructions. °· Will watch your condition. °· Will get help right away if you are not doing well or get worse. °Document Released: 08/03/2001 Document Revised: 02/08/2012 Document Reviewed: 12/19/2011 °Exit[employer]® Patient Information ©2014 [employer]. ° °

## 2013-10-19 ENCOUNTER — Other Ambulatory Visit: Payer: Self-pay | Admitting: Neurological Surgery

## 2013-10-26 ENCOUNTER — Encounter (HOSPITAL_COMMUNITY): Payer: Self-pay | Admitting: Pharmacy Technician

## 2013-10-26 ENCOUNTER — Telehealth: Payer: Self-pay | Admitting: Cardiology

## 2013-10-26 NOTE — Telephone Encounter (Signed)
New message     Pt is scheduled for spine surgery on 11-07-13.   He need clearance.  Dr Alford HighlandMcLean's next opening is end of April.  He has not been seen since 2012.  Can you work this pt in before 3-18 to be cleared?

## 2013-10-26 NOTE — Telephone Encounter (Signed)
Follow Up   Pt called for clearance. Same as previous note. No appt since 2012. Next available appt is now in May. Please assist.

## 2013-10-26 NOTE — Telephone Encounter (Signed)
If he needs clearance urgently I would offer him an appt with PA/NP.

## 2013-10-29 ENCOUNTER — Encounter: Payer: Self-pay | Admitting: Cardiology

## 2013-10-29 DIAGNOSIS — H5702 Anisocoria: Secondary | ICD-10-CM

## 2013-10-29 DIAGNOSIS — I2510 Atherosclerotic heart disease of native coronary artery without angina pectoris: Secondary | ICD-10-CM

## 2013-10-29 DIAGNOSIS — E669 Obesity, unspecified: Secondary | ICD-10-CM | POA: Insufficient documentation

## 2013-10-29 DIAGNOSIS — K219 Gastro-esophageal reflux disease without esophagitis: Secondary | ICD-10-CM

## 2013-10-29 DIAGNOSIS — E785 Hyperlipidemia, unspecified: Secondary | ICD-10-CM

## 2013-10-29 DIAGNOSIS — M519 Unspecified thoracic, thoracolumbar and lumbosacral intervertebral disc disorder: Secondary | ICD-10-CM

## 2013-10-29 NOTE — Progress Notes (Unsigned)
Patient ID: Thomas [name], male   DOB: May 03, 1941, 73 y.o.   MRN: [id number]   Thomas [name], Thomas [name]    Date of visit:  10/29/2013 DOB:  [birth year]    Age:  73 yrs. Medical record number:   [id number] Account number:  [id number] Primary Care Provider: Thora Lance ____________________________ CURRENT DIAGNOSES  1. Pre-Op Cardiovascular Exam  2. CAD,Native  3. Hypertensive Heart Disease-Benign without CHF  4. Hyperlipidemia  5. Obesity(BMI30-40)  6. Aneurysm-Abdominal  7. COPD  8. GERD  9. Surgery-Aortocoronary Bypass Grafting ____________________________ ALLERGIES  Adhesive, Blisters  Ceftin, Rash  Codeine, Rash  Crestor, Muscle aches  Keflex, Rash  Latex, Rash  Lipitor, Muscle aches  Penicillins, Rash ____________________________ MEDICATIONS  1. Viagra 50 mg tablet, PRN  2. Ventolin HFA 90 mcg/actuation aerosol inhaler, PRN  3. Zantac 150 mg tablet, PRN  4. prednisone 5 mg tablet, qd or prn  5. aspirin 81 mg chewable tablet, 1 p.o. daily  6. cyclobenzaprine 10 mg tablet, HS prn  7. lisinopril 10 mg tablet, 1 p.o. daily  8. Dulera 200 mcg-5 mcg/actuation HFA aerosol inhaler, 2 puff bid  9. Dipentum 250 mg capsule, 1 p.o. daily  10. oxycodone-acetaminophen 5 mg-325 mg tablet, PRN  11. Allegra Allergy 180 mg tablet, 1 p.o. daily ____________________________ CHIEF COMPLAINTS  Hx cabg  To have back surgery ____________________________ HISTORY OF PRESENT ILLNESS This very nice 73 year old male is seen for preoperative cardiac evaluation. The patient has a history of coronary artery disease with bypass grafting for three-vessel disease in 2006 by Dr. Laneta Simmers. He has done well since surgery and denies angina, PND, orthopnea or edema. He has had a myocardial perfusion scan done in 2011 that showed no significant ischemia. He has a known and yours and that is being followed. He recently developed a herniated disc and is in need of surgery. Prior to the disc herniation, he was able  to do yard work, he walked 2 miles per day and had no angina or any cardiac symptoms. He denies PND, orthopnea, syncope, or claudication. He has not seen a cardiologist in a couple of years and wishes to change care to this location. He does have a history of asthma and does have a long history of cigarette smoking but has been quit for over 20 years. ____________________________ PAST HISTORY  Past Medical Illnesses:  hypertension, hyperlipidemia, COPD, diverticulosis, colitis, GERD, peripheral neuropathy, asthma, obesity;  Cardiovascular Illnesses:  CAD, abdominal aneurysm;  Surgical Procedures:  laminectomy lumbar, CABG, sinus surgery, cataract extraction, eye socket surgery, knee surgery, rt;  NYHA Classification:  I;  Cardiology Procedures-Invasive:  cardiac cath (left) February 2006, CABG w LIMA to LAD, SVG-int, SVG-OM, SVG-PD 10/23/04  Dr. Laneta Simmers;  Cardiology Procedures-Noninvasive:  adenosine cardiolite;  Cardiac Cath Results:  20 % stenosis distal Left main, 70% stenosis mid LAD, 60% stenosis ostial CFX, 70% stenosis proximal mid RCA, 90% stenosis PDA;  LVEF not documented,   ____________________________ CARDIO-PULMONARY TEST DATES EKG Date:  10/29/2013;   ____________________________ FAMILY HISTORY Brother -- Brother alive with problem, Prostate cancer Brother -- Brother dead, Malignant neoplasm of lung Father -- Father dead, Hemorrhagic cerebral infarction Mother -- Mother dead, Dementia/Alzheimers Sister -- Sister dead, Intracranial hemorrhage Sister -- Sister dead, Intracranial hemorrhage Sister -- Sister dead, Liver disease ____________________________ SOCIAL HISTORY Alcohol Use:  no alcohol use;  Smoking:  used to smoke but quit 1993, 50 pack year history;  Diet:  regular diet;  Lifestyle:  married;  Exercise:  no regular exercise;  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 Respiratory: history of asthma, occasional cough Cardiovascular:  please review HPI Abdominal: dyspepsia Genitourinary-Male: BPH, frequency, hesitancy  Musculoskeletal:  see HPI  Neurological:  some numbness of feet  Psychiatric:  mild depression in past, none currently  ____________________________ PHYSICAL EXAMINATION VITAL SIGNS  Blood Pressure:  160/74 Sitting, Right arm, regular cuff  , 158/72 Standing, Right arm and regular cuff   Pulse:  96/min. Weight:  211.00 lbs. Height:  68"BMI: 32  Constitutional:  pleasant white male in no acute distress, mildly obese Skin:  warm and dry to touch, no apparent skin lesions, or masses noted. Head:  normocephalic, normal hair pattern, no masses or tenderness Eyes:  EOMS Intact, PERRLA, C and S clear, Funduscopic exam not done. ENT:  ears, nose and throat reveal no gross abnormalities.  Full lmouth dentures present Neck:  supple, without massess. No JVD, thyromegaly or carotid bruits. Carotid upstroke normal. Chest:  normal symmetry, clear to auscultation., healed median sternotomy scar Cardiac:  regular rhythm, normal S1 and S2, No S3 or S4, no murmurs, gallops or rubs detected. Abdomen:  abdomen soft,non-tender, no masses, no hepatospenomegaly, or aneurysm noted Peripheral Pulses:  the femoral,dorsalis pedis, and posterior tibial pulses are full and equal bilaterally with no bruits auscultated. Extremities & Back:  well healed saphenous vein donor site RLE, bilateral venous insufficiency changes present, trace edema Neurological:  no gross motor or sensory deficits noted, affect appropriate, oriented x3. ___________________________ IMPRESSIONS/PLAN  1. From a cardiovascular viewpoint it is acceptable to proceed with the planned lumbar  disc surgery. His cardiac risk should be average for general anesthesia and he should continue his usual medications during the time of surgery. I would be happy to see him in the hospital during the perioperative period if needed. 2. Coronary artery disease with previous bypass grafting which is stable 3. Hypertension 4. Hyperlipidemia 5. History of COPD and asthma  Recommendations:  May proceed with surgery. He does have significant asthma and may need pulmonary evaluation around the time of surgery. His EKG is normal today. ____________________________ TODAYS ORDERS  1. Return Visit: 6 months  2. 12 Lead EKG: Today                       ____________________________ Cardiology Physician:  Darden PalmerW. Spencer Tilley, Jr. MD [facility]

## 2013-10-31 ENCOUNTER — Other Ambulatory Visit (HOSPITAL_COMMUNITY): Payer: Self-pay | Admitting: *Deleted

## 2013-10-31 NOTE — Pre-Procedure Instructions (Signed)
Thomas [name]  10/31/2013   Your procedure is scheduled on:  Wednesday, November 07, 2013 at 4:20 PM.   Report to [facility] Entrance "A" Admitting Office at 2:20 PM.   Call this number if you have problems the morning of surgery: [phone]   Remember:   Do not eat food or drink liquids after midnight Tuesday, 11/06/13.   Take these medicines the morning of surgery with A SIP OF WATER: fexofenadine (ALLEGRA), oxyCODONE-acetaminophen (PERCOCET/ROXICET) - if needed. You may use your inhalers and nebulizer treatment if needed. Please bring your Albuterol inhaler with you day of surgery.  Stop Aspirin And Dipentum (Olsalazine) as of today.   Do not wear jewelry.  Do not wear lotions, powders, or cologne. You may wear deodorant.  Men may shave face and neck.  Do not bring valuables to the hospital.  [facility] is not responsible                  for any belongings or valuables.               Contacts, dentures or bridgework may not be worn into surgery.  Leave suitcase in the car. After surgery it may be brought to your room.  For patients admitted to the hospital, discharge time is determined by your                treatment team.              Special Instructions: Thomas [name] - Preparing for Surgery  Before surgery, you can play an important role.  Because skin is not sterile, your skin needs to be as free of germs as possible.  You can reduce the number of germs on you skin by washing with CHG (chlorahexidine gluconate) soap before surgery.  CHG is an antiseptic cleaner which kills germs and bonds with the skin to continue killing germs even after washing.  Please DO NOT use if you have an allergy to CHG or antibacterial soaps.  If your skin becomes reddened/irritated stop using the CHG and inform your nurse when you arrive at Short Stay.  Do not shave (including legs and underarms) for at least 48 hours prior to the first CHG shower.  You may shave your face.  Please  follow these instructions carefully:   1.  Shower with CHG Soap the night before surgery and the                                morning of Surgery.  2.  If you choose to wash your hair, wash your hair first as usual with your       normal shampoo.  3.  After you shampoo, rinse your hair and body thoroughly to remove the                      Shampoo.  4.  Use CHG as you would any other liquid soap.  You can apply chg directly       to the skin and wash gently with scrungie or a clean washcloth.  5.  Apply the CHG Soap to your body ONLY FROM THE NECK DOWN.        Do not use on open wounds or open sores.  Avoid contact with your eyes, ears, mouth and genitals (private parts).  Wash genitals (private parts) with your normal soap.  6.  Wash thoroughly, paying special attention to the area where your surgery        will be performed.  7.  Thoroughly rinse your body with warm water from the neck down.  8.  DO NOT shower/wash with your normal soap after using and rinsing off       the CHG Soap.  9.  Pat yourself dry with a clean towel.            10.  Wear clean pajamas.            11.  Place clean sheets on your bed the night of your first shower and do not        sleep with pets.  Day of Surgery  Do not apply any lotions the morning of surgery.  Please wear clean clothes to the hospital/surgery center.      Please read over the following fact sheets that you were given: Pain Booklet, Coughing and Deep Breathing, MRSA Information and Surgical Site Infection Prevention

## 2013-11-01 ENCOUNTER — Encounter (HOSPITAL_COMMUNITY)
Admission: RE | Admit: 2013-11-01 | Discharge: 2013-11-01 | Disposition: A | Discharge status: Home / Self Care | Payer: Medicare Other | Source: Ambulatory Visit | Attending: Neurological Surgery | Admitting: Neurological Surgery

## 2013-11-01 ENCOUNTER — Encounter (HOSPITAL_COMMUNITY): Payer: Self-pay

## 2013-11-01 DIAGNOSIS — Z01818 Encounter for other preprocedural examination: Secondary | ICD-10-CM | POA: Insufficient documentation

## 2013-11-01 DIAGNOSIS — Z01812 Encounter for preprocedural laboratory examination: Secondary | ICD-10-CM | POA: Insufficient documentation

## 2013-11-01 HISTORY — DX: Shortness of breath: R06.02

## 2013-11-01 HISTORY — DX: Abdominal aortic aneurysm, without rupture: I71.4

## 2013-11-01 HISTORY — DX: Abdominal aortic aneurysm, without rupture, unspecified: I71.40

## 2013-11-01 HISTORY — DX: Unspecified osteoarthritis, unspecified site: M19.90

## 2013-11-01 LAB — CBC WITH DIFFERENTIAL/PLATELET
BASOS ABS: 0.1 10*3/uL (ref 0.0–0.1)
Basophils Relative: 1 % (ref 0–1)
EOS PCT: 17 % — AB (ref 0–5)
Eosinophils Absolute: 1.4 10*3/uL — ABNORMAL HIGH (ref 0.0–0.7)
HEMATOCRIT: 40.9 % (ref 39.0–52.0)
Hemoglobin: 14.3 g/dL (ref 13.0–17.0)
LYMPHS ABS: 2.7 10*3/uL (ref 0.7–4.0)
LYMPHS PCT: 31 % (ref 12–46)
MCH: 32.2 pg (ref 26.0–34.0)
MCHC: 35 g/dL (ref 30.0–36.0)
MCV: 92.1 fL (ref 78.0–100.0)
MONO ABS: 0.8 10*3/uL (ref 0.1–1.0)
Monocytes Relative: 10 % (ref 3–12)
NEUTROS ABS: 3.7 10*3/uL (ref 1.7–7.7)
Neutrophils Relative %: 43 % (ref 43–77)
Platelets: 281 10*3/uL (ref 150–400)
RBC: 4.44 MIL/uL (ref 4.22–5.81)
RDW: 14.2 % (ref 11.5–15.5)
WBC: 8.7 10*3/uL (ref 4.0–10.5)

## 2013-11-01 LAB — SURGICAL PCR SCREEN
MRSA, PCR: NEGATIVE
STAPHYLOCOCCUS AUREUS: POSITIVE — AB

## 2013-11-01 LAB — BASIC METABOLIC PANEL
BUN: 18 mg/dL (ref 6–23)
CO2: 24 meq/L (ref 19–32)
CREATININE: 0.92 mg/dL (ref 0.50–1.35)
Calcium: 9.2 mg/dL (ref 8.4–10.5)
Chloride: 103 mEq/L (ref 96–112)
GFR calc Af Amer: >90 mL/min (ref >90)
GFR calc non Af Amer: 82 mL/min — ABNORMAL LOW (ref >90)
Glucose, Bld: 97 mg/dL (ref 70–99)
POTASSIUM: 4.1 meq/L (ref 3.7–5.3)
Sodium: 142 mEq/L (ref 137–147)

## 2013-11-01 LAB — PROTIME-INR
INR: 0.99 (ref 0.00–1.49)
Prothrombin Time: 12.9 seconds (ref 11.6–15.2)

## 2013-11-01 NOTE — Progress Notes (Signed)
Patient made aware of nasal swab results and that prescription has been called to his pharmacy .

## 2013-11-05 ENCOUNTER — Encounter (HOSPITAL_COMMUNITY): Payer: Self-pay

## 2013-11-05 NOTE — Progress Notes (Signed)
Anesthesia Chart Review:  Patient is a 73 year old male scheduled for left L2-3 microdiscectomy on 11/07/13 by Dr. Yetta BarreJones.  History includes CAD s/p CABG (LIMA to LAD, SVG-int, SVG-OM, SVG-PD) '06, 4.2 cm AAA by CT of the abd/pelvix 10/01/13, asthma, former smoker, COPD, GERD, HTN, HLD, diverticulosis, colitis, right pupil irregularity secondary to injury, polio affecting LLE, TKA, lumbar surgery, nasal sinus surgery. PCP is Dr. Kirby FunkJohn Griffin.  Pulmonologist is Dr. Jetty Duhamellinton Young. Cardiologist Dr. Viann FishSpencer Tilley cleared patient with acceptable risk for this procedure.  He had a normal nuclear stress test, EF 71 % on 01/13/11.  Echo on 01/13/11 showed: Normal LV size and systolic function, EF 55-60%. Normal diastolic function. Normal RV size and systolic function. No significant valvular abnormalities (trivial AR, MR, PR, TR).  EKG on 10/29/13 showed NSR.  CXR on 11/01/13 showed: 1. No active cardiopulmonary disease.  2. 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  3. Pulmonary hyperinflation and a central bronchitic changes suggest underlying COPD.  Preoperative labs noted.  If no acute changes then anticipate that he can proceed.  Velna Ochsllison Clent Damore, PA-C [facility]/Anesthesiology Phone [phone] 11/05/2013 9:55 AM

## 2013-11-06 MED ORDER — VANCOMYCIN HCL IN DEXTROSE 1-5 GM/200ML-% IV SOLN
1000.0000 mg | INTRAVENOUS | Status: AC
  Administered 2013-11-07: 1000 mg via INTRAVENOUS
  Filled 2013-11-06: qty 200

## 2013-11-07 ENCOUNTER — Encounter (HOSPITAL_COMMUNITY): Payer: Medicare Other | Admitting: Vascular Surgery

## 2013-11-07 ENCOUNTER — Encounter (HOSPITAL_COMMUNITY)
Admission: RE | Disposition: A | Discharge status: Home / Self Care | Payer: Self-pay | Source: Ambulatory Visit | Attending: Neurological Surgery

## 2013-11-07 ENCOUNTER — Inpatient Hospital Stay (HOSPITAL_COMMUNITY): Payer: Medicare Other

## 2013-11-07 ENCOUNTER — Ambulatory Visit (HOSPITAL_COMMUNITY)
Admission: RE | Admit: 2013-11-07 | Discharge: 2013-11-08 | Disposition: A | Discharge status: Home / Self Care | Payer: Medicare Other | Source: Ambulatory Visit | Attending: Neurological Surgery | Admitting: Neurological Surgery

## 2013-11-07 ENCOUNTER — Encounter (HOSPITAL_COMMUNITY): Payer: Self-pay | Admitting: Anesthesiology

## 2013-11-07 ENCOUNTER — Inpatient Hospital Stay (HOSPITAL_COMMUNITY): Payer: Medicare Other | Admitting: Anesthesiology

## 2013-11-07 DIAGNOSIS — I714 Abdominal aortic aneurysm, without rupture, unspecified: Secondary | ICD-10-CM | POA: Insufficient documentation

## 2013-11-07 DIAGNOSIS — E669 Obesity, unspecified: Secondary | ICD-10-CM | POA: Insufficient documentation

## 2013-11-07 DIAGNOSIS — I10 Essential (primary) hypertension: Secondary | ICD-10-CM | POA: Insufficient documentation

## 2013-11-07 DIAGNOSIS — Z951 Presence of aortocoronary bypass graft: Secondary | ICD-10-CM | POA: Insufficient documentation

## 2013-11-07 DIAGNOSIS — J449 Chronic obstructive pulmonary disease, unspecified: Secondary | ICD-10-CM | POA: Insufficient documentation

## 2013-11-07 DIAGNOSIS — Z7982 Long term (current) use of aspirin: Secondary | ICD-10-CM | POA: Insufficient documentation

## 2013-11-07 DIAGNOSIS — E785 Hyperlipidemia, unspecified: Secondary | ICD-10-CM | POA: Insufficient documentation

## 2013-11-07 DIAGNOSIS — Z9889 Other specified postprocedural states: Secondary | ICD-10-CM

## 2013-11-07 DIAGNOSIS — J4489 Other specified chronic obstructive pulmonary disease: Secondary | ICD-10-CM | POA: Insufficient documentation

## 2013-11-07 DIAGNOSIS — M5126 Other intervertebral disc displacement, lumbar region: Principal | ICD-10-CM | POA: Insufficient documentation

## 2013-11-07 DIAGNOSIS — I2510 Atherosclerotic heart disease of native coronary artery without angina pectoris: Secondary | ICD-10-CM | POA: Insufficient documentation

## 2013-11-07 DIAGNOSIS — K219 Gastro-esophageal reflux disease without esophagitis: Secondary | ICD-10-CM | POA: Insufficient documentation

## 2013-11-07 HISTORY — PX: LUMBAR LAMINECTOMY/DECOMPRESSION MICRODISCECTOMY: SHX5026

## 2013-11-07 SURGERY — LUMBAR LAMINECTOMY/DECOMPRESSION MICRODISCECTOMY 1 LEVEL
Anesthesia: General | Site: Back | Laterality: Left

## 2013-11-07 MED ORDER — ONDANSETRON HCL 4 MG/2ML IJ SOLN: 4.0000 mg | Freq: Once | INTRAMUSCULAR | Status: DC | PRN

## 2013-11-07 MED ORDER — CEFAZOLIN SODIUM 1-5 GM-% IV SOLN: 1.0000 g | Freq: Three times a day (TID) | INTRAVENOUS | Status: DC

## 2013-11-07 MED ORDER — ALBUTEROL SULFATE (2.5 MG/3ML) 0.083% IN NEBU: 2.5000 mg | INHALATION_SOLUTION | RESPIRATORY_TRACT | Status: DC | PRN

## 2013-11-07 MED ORDER — THROMBIN 5000 UNITS EX SOLR
CUTANEOUS | Status: DC | PRN
  Administered 2013-11-07 (×2): 5000 [IU] via TOPICAL

## 2013-11-07 MED ORDER — MORPHINE SULFATE 2 MG/ML IJ SOLN: 1.0000 mg | INTRAMUSCULAR | Status: DC | PRN

## 2013-11-07 MED ORDER — POTASSIUM CHLORIDE IN NACL 20-0.9 MEQ/L-% IV SOLN
INTRAVENOUS | Status: DC
  Filled 2013-11-07 (×3): qty 1000

## 2013-11-07 MED ORDER — HEMOSTATIC AGENTS (NO CHARGE) OPTIME
TOPICAL | Status: DC | PRN
  Administered 2013-11-07: 1 via TOPICAL

## 2013-11-07 MED ORDER — LIDOCAINE HCL (CARDIAC) 20 MG/ML IV SOLN
INTRAVENOUS | Status: DC | PRN
  Administered 2013-11-07: 60 mg via INTRAVENOUS

## 2013-11-07 MED ORDER — MOMETASONE FURO-FORMOTEROL FUM 100-5 MCG/ACT IN AERO
2.0000 | INHALATION_SPRAY | Freq: Two times a day (BID) | RESPIRATORY_TRACT | Status: DC
  Filled 2013-11-07: qty 8.8

## 2013-11-07 MED ORDER — PHENYLEPHRINE 40 MCG/ML (10ML) SYRINGE FOR IV PUSH (FOR BLOOD PRESSURE SUPPORT)
PREFILLED_SYRINGE | INTRAVENOUS | Status: AC
  Filled 2013-11-07: qty 10

## 2013-11-07 MED ORDER — EPHEDRINE SULFATE 50 MG/ML IJ SOLN
INTRAMUSCULAR | Status: DC | PRN
  Administered 2013-11-07: 5 mg via INTRAVENOUS
  Administered 2013-11-07: 10 mg via INTRAVENOUS
  Administered 2013-11-07: 15 mg via INTRAVENOUS

## 2013-11-07 MED ORDER — OXYCODONE-ACETAMINOPHEN 5-325 MG PO TABS: 1.0000 | ORAL_TABLET | Freq: Four times a day (QID) | ORAL | Status: DC | PRN

## 2013-11-07 MED ORDER — ONDANSETRON HCL 4 MG/2ML IJ SOLN
INTRAMUSCULAR | Status: DC | PRN
  Administered 2013-11-07: 4 mg via INTRAVENOUS

## 2013-11-07 MED ORDER — FENTANYL CITRATE 0.05 MG/ML IJ SOLN
INTRAMUSCULAR | Status: DC | PRN
  Administered 2013-11-07: 50 ug via INTRAVENOUS
  Administered 2013-11-07: 100 ug via INTRAVENOUS

## 2013-11-07 MED ORDER — SODIUM CHLORIDE 0.9 % IJ SOLN
3.0000 mL | Freq: Two times a day (BID) | INTRAMUSCULAR | Status: DC
  Administered 2013-11-08: 3 mL via INTRAVENOUS

## 2013-11-07 MED ORDER — GLYCOPYRROLATE 0.2 MG/ML IJ SOLN
INTRAMUSCULAR | Status: DC | PRN
  Administered 2013-11-07: 0.4 mg via INTRAVENOUS

## 2013-11-07 MED ORDER — PHENOL 1.4 % MT LIQD
1.0000 | OROMUCOSAL | Status: DC | PRN
Start: 2013-11-07 — End: 2013-11-08

## 2013-11-07 MED ORDER — CYCLOBENZAPRINE HCL 10 MG PO TABS: 10.0000 mg | ORAL_TABLET | Freq: Every evening | ORAL | Status: DC | PRN

## 2013-11-07 MED ORDER — SODIUM CHLORIDE 0.9 % IJ SOLN
3.0000 mL | INTRAMUSCULAR | Status: DC | PRN
Start: 2013-11-07 — End: 2013-11-08

## 2013-11-07 MED ORDER — PROPOFOL 10 MG/ML IV BOLUS
INTRAVENOUS | Status: DC | PRN
  Administered 2013-11-07: 200 mg via INTRAVENOUS

## 2013-11-07 MED ORDER — ROCURONIUM BROMIDE 100 MG/10ML IV SOLN
INTRAVENOUS | Status: DC | PRN
  Administered 2013-11-07: 40 mg via INTRAVENOUS

## 2013-11-07 MED ORDER — DEXAMETHASONE 4 MG PO TABS
4.0000 mg | ORAL_TABLET | Freq: Four times a day (QID) | ORAL | Status: DC
  Administered 2013-11-07 – 2013-11-08 (×3): 4 mg via ORAL
  Filled 2013-11-07 (×6): qty 1

## 2013-11-07 MED ORDER — PROPOFOL 10 MG/ML IV BOLUS
INTRAVENOUS | Status: AC
  Filled 2013-11-07: qty 20

## 2013-11-07 MED ORDER — ARTIFICIAL TEARS OP OINT
TOPICAL_OINTMENT | OPHTHALMIC | Status: AC
  Filled 2013-11-07: qty 3.5

## 2013-11-07 MED ORDER — LACTATED RINGERS IV SOLN
INTRAVENOUS | Status: DC | PRN
  Administered 2013-11-07 (×2): via INTRAVENOUS

## 2013-11-07 MED ORDER — LIDOCAINE HCL 4 % MT SOLN
OROMUCOSAL | Status: DC | PRN
  Administered 2013-11-07: 4 mL via TOPICAL

## 2013-11-07 MED ORDER — ROCURONIUM BROMIDE 50 MG/5ML IV SOLN
INTRAVENOUS | Status: AC
  Filled 2013-11-07: qty 1

## 2013-11-07 MED ORDER — PREDNISONE 5 MG PO TABS
5.0000 mg | ORAL_TABLET | Freq: Every day | ORAL | Status: DC | PRN
  Filled 2013-11-07: qty 1

## 2013-11-07 MED ORDER — GLYCOPYRROLATE 0.2 MG/ML IJ SOLN
INTRAMUSCULAR | Status: AC
  Filled 2013-11-07: qty 2

## 2013-11-07 MED ORDER — PHENYLEPHRINE HCL 10 MG/ML IJ SOLN
INTRAMUSCULAR | Status: DC | PRN
  Administered 2013-11-07 (×5): 80 ug via INTRAVENOUS

## 2013-11-07 MED ORDER — VANCOMYCIN HCL IN DEXTROSE 1-5 GM/200ML-% IV SOLN
1000.0000 mg | Freq: Once | INTRAVENOUS | Status: AC
  Administered 2013-11-08: 1000 mg via INTRAVENOUS
  Filled 2013-11-07: qty 200

## 2013-11-07 MED ORDER — ALBUTEROL SULFATE HFA 108 (90 BASE) MCG/ACT IN AERS: 2.0000 | INHALATION_SPRAY | Freq: Four times a day (QID) | RESPIRATORY_TRACT | Status: DC | PRN

## 2013-11-07 MED ORDER — LISINOPRIL 10 MG PO TABS
10.0000 mg | ORAL_TABLET | ORAL | Status: DC
  Administered 2013-11-08: 10 mg via ORAL
  Filled 2013-11-07 (×2): qty 1

## 2013-11-07 MED ORDER — ONDANSETRON HCL 4 MG/2ML IJ SOLN
INTRAMUSCULAR | Status: AC
Start: 2013-11-07 — End: 2013-11-07
  Filled 2013-11-07: qty 2

## 2013-11-07 MED ORDER — DEXAMETHASONE SODIUM PHOSPHATE 4 MG/ML IJ SOLN
4.0000 mg | Freq: Four times a day (QID) | INTRAMUSCULAR | Status: DC
  Filled 2013-11-07 (×4): qty 1

## 2013-11-07 MED ORDER — ASPIRIN EC 81 MG PO TBEC
81.0000 mg | DELAYED_RELEASE_TABLET | Freq: Every day | ORAL | Status: DC
  Administered 2013-11-07 – 2013-11-08 (×2): 81 mg via ORAL
  Filled 2013-11-07 (×2): qty 1

## 2013-11-07 MED ORDER — ACETAMINOPHEN 650 MG RE SUPP: 650.0000 mg | RECTAL | Status: DC | PRN

## 2013-11-07 MED ORDER — ARTIFICIAL TEARS OP OINT
TOPICAL_OINTMENT | OPHTHALMIC | Status: DC | PRN
  Administered 2013-11-07: 1 via OPHTHALMIC

## 2013-11-07 MED ORDER — NEOSTIGMINE METHYLSULFATE 1 MG/ML IJ SOLN
INTRAMUSCULAR | Status: DC | PRN
  Administered 2013-11-07: 3 mg via INTRAVENOUS

## 2013-11-07 MED ORDER — SODIUM CHLORIDE 0.9 % IV SOLN: 250.0000 mL | INTRAVENOUS | Status: DC

## 2013-11-07 MED ORDER — THROMBIN 5000 UNITS EX SOLR
OROMUCOSAL | Status: DC | PRN
  Administered 2013-11-07: 17:00:00 via TOPICAL

## 2013-11-07 MED ORDER — BUPIVACAINE HCL (PF) 0.25 % IJ SOLN
INTRAMUSCULAR | Status: DC | PRN
  Administered 2013-11-07: 2 mL

## 2013-11-07 MED ORDER — LACTATED RINGERS IV SOLN
INTRAVENOUS | Status: DC
  Administered 2013-11-07: 15:00:00 via INTRAVENOUS

## 2013-11-07 MED ORDER — FENTANYL CITRATE 0.05 MG/ML IJ SOLN
INTRAMUSCULAR | Status: AC
  Filled 2013-11-07: qty 5

## 2013-11-07 MED ORDER — HYDROMORPHONE HCL PF 1 MG/ML IJ SOLN: 0.2500 mg | INTRAMUSCULAR | Status: DC | PRN

## 2013-11-07 MED ORDER — ONDANSETRON HCL 4 MG/2ML IJ SOLN: 4.0000 mg | INTRAMUSCULAR | Status: DC | PRN

## 2013-11-07 MED ORDER — OLSALAZINE SODIUM 250 MG PO CAPS
250.0000 mg | ORAL_CAPSULE | Freq: Every day | ORAL | Status: DC
  Administered 2013-11-07 – 2013-11-08 (×2): 250 mg via ORAL
  Filled 2013-11-07 (×2): qty 1

## 2013-11-07 MED ORDER — SODIUM CHLORIDE 0.9 % IR SOLN
Status: DC | PRN
  Administered 2013-11-07: 17:00:00

## 2013-11-07 MED ORDER — ACETAMINOPHEN 325 MG PO TABS: 650.0000 mg | ORAL_TABLET | ORAL | Status: DC | PRN

## 2013-11-07 MED ORDER — DEXAMETHASONE SODIUM PHOSPHATE 10 MG/ML IJ SOLN
INTRAMUSCULAR | Status: DC | PRN
  Administered 2013-11-07: 10 mg via INTRAVENOUS

## 2013-11-07 MED ORDER — NEOSTIGMINE METHYLSULFATE 1 MG/ML IJ SOLN
INTRAMUSCULAR | Status: AC
  Filled 2013-11-07: qty 10

## 2013-11-07 MED ORDER — MENTHOL 3 MG MT LOZG: 1.0000 | LOZENGE | OROMUCOSAL | Status: DC | PRN

## 2013-11-07 SURGICAL SUPPLY — 51 items
APL SKNCLS STERI-STRIP NONHPOA (GAUZE/BANDAGES/DRESSINGS) ×1
BAG DECANTER FOR FLEXI CONT (MISCELLANEOUS) ×2 IMPLANT
BENZOIN TINCTURE PRP APPL 2/3 (GAUZE/BANDAGES/DRESSINGS) ×2 IMPLANT
BUR MATCHSTICK NEURO 3.0 LAGG (BURR) ×2 IMPLANT
CANISTER SUCT 3000ML (MISCELLANEOUS) ×2 IMPLANT
CONT SPEC 4OZ CLIKSEAL STRL BL (MISCELLANEOUS) ×2 IMPLANT
DRAPE LAPAROTOMY 100X72X124 (DRAPES) ×2 IMPLANT
DRAPE MICROSCOPE ZEISS OPMI (DRAPES) ×2 IMPLANT
DRAPE POUCH INSTRU U-SHP 10X18 (DRAPES) ×2 IMPLANT
DRAPE SURG 17X23 STRL (DRAPES) ×2 IMPLANT
DRESSING TELFA 8X3 (GAUZE/BANDAGES/DRESSINGS) ×2 IMPLANT
DRSG OPSITE 4X5.5 SM (GAUZE/BANDAGES/DRESSINGS) ×2 IMPLANT
DRSG OPSITE POSTOP 4X6 (GAUZE/BANDAGES/DRESSINGS) ×1 IMPLANT
DURAPREP 26ML APPLICATOR (WOUND CARE) ×2 IMPLANT
ELECT REM PT RETURN 9FT ADLT (ELECTROSURGICAL) ×2
ELECTRODE REM PT RTRN 9FT ADLT (ELECTROSURGICAL) ×1 IMPLANT
GAUZE SPONGE 4X4 16PLY XRAY LF (GAUZE/BANDAGES/DRESSINGS) IMPLANT
GLOVE BIO SURGEON STRL SZ8 (GLOVE) ×1 IMPLANT
GLOVE BIOGEL PI IND STRL 6.5 (GLOVE) IMPLANT
GLOVE BIOGEL PI INDICATOR 6.5 (GLOVE) ×1
GLOVE SURG SS PI 6.5 STRL IVOR (GLOVE) ×2 IMPLANT
GLOVE SURG SS PI 8.0 STRL IVOR (GLOVE) ×1 IMPLANT
GOWN BRE IMP SLV AUR LG STRL (GOWN DISPOSABLE) IMPLANT
GOWN BRE IMP SLV AUR XL STRL (GOWN DISPOSABLE) ×2 IMPLANT
GOWN STRL REIN 2XL LVL4 (GOWN DISPOSABLE) IMPLANT
GOWN STRL REUS W/ TWL LRG LVL3 (GOWN DISPOSABLE) IMPLANT
GOWN STRL REUS W/ TWL XL LVL3 (GOWN DISPOSABLE) IMPLANT
GOWN STRL REUS W/TWL LRG LVL3 (GOWN DISPOSABLE) ×4
GOWN STRL REUS W/TWL XL LVL3 (GOWN DISPOSABLE) ×2
HEMOSTAT POWDER KIT SURGIFOAM (HEMOSTASIS) IMPLANT
KIT BASIN OR (CUSTOM PROCEDURE TRAY) ×2 IMPLANT
KIT ROOM TURNOVER OR (KITS) ×2 IMPLANT
NDL HYPO 25X1 1.5 SAFETY (NEEDLE) ×1 IMPLANT
NDL SPNL 20GX3.5 QUINCKE YW (NEEDLE) IMPLANT
NEEDLE HYPO 25X1 1.5 SAFETY (NEEDLE) ×2 IMPLANT
NEEDLE SPNL 20GX3.5 QUINCKE YW (NEEDLE) IMPLANT
NS IRRIG 1000ML POUR BTL (IV SOLUTION) ×2 IMPLANT
PACK LAMINECTOMY NEURO (CUSTOM PROCEDURE TRAY) ×2 IMPLANT
PAD ARMBOARD 7.5X6 YLW CONV (MISCELLANEOUS) ×8 IMPLANT
RUBBERBAND STERILE (MISCELLANEOUS) ×4 IMPLANT
SPONGE GAUZE 4X4 12PLY (GAUZE/BANDAGES/DRESSINGS) ×1 IMPLANT
SPONGE SURGIFOAM ABS GEL SZ50 (HEMOSTASIS) ×2 IMPLANT
STRIP CLOSURE SKIN 1/2X4 (GAUZE/BANDAGES/DRESSINGS) ×2 IMPLANT
SUT VIC AB 0 CT1 18XCR BRD8 (SUTURE) ×1 IMPLANT
SUT VIC AB 0 CT1 8-18 (SUTURE) ×2
SUT VIC AB 2-0 CP2 18 (SUTURE) ×2 IMPLANT
SUT VIC AB 3-0 SH 8-18 (SUTURE) ×2 IMPLANT
SYR 20ML ECCENTRIC (SYRINGE) ×2 IMPLANT
TOWEL OR 17X24 6PK STRL BLUE (TOWEL DISPOSABLE) ×2 IMPLANT
TOWEL OR 17X26 10 PK STRL BLUE (TOWEL DISPOSABLE) ×2 IMPLANT
WATER STERILE IRR 1000ML POUR (IV SOLUTION) ×2 IMPLANT

## 2013-11-07 NOTE — H&P (Signed)
Subjective: Patient is a 73 y.o. male admitted for laminectomy and discectomy. Onset of symptoms was a few months ago, rapidly worsening since that time.  The pain is rated severe, and is located at the across the lower back and radiates to leg. The pain is described as aching and occurs all day. The symptoms have been progressive. Symptoms are exacerbated by coughing, exercise and standing. MRI or CT showed foraminal disc herniation.   Past Medical History  Diagnosis Date  . Allergic rhinitis   . Allergic asthma   . Polio     left leg  . GERD (gastroesophageal reflux disease)   . 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 HLD (hyperlipidemia)   . HTN (hypertension)   . Colitis   . Obesity   . Pupil irregularity, right     old injury  . Shortness of breath     exertion, asthma attack  . Arthritis   . AAA (abdominal aortic aneurysm)     4.2 cm by CTA 10/01/13    Past Surgical History  Procedure Laterality Date  . Coronary artery bypass graft  3/06  . Total knee arthroplasty    . Cataract extraction    . Eye surgery      rebuild eye socket (R)  . Nasal sinus surgery    . Lumbar disc surgery  2011    Prior to Admission medications   Medication Sig Start Date End Date Taking? Authorizing Provider  albuterol (PROAIR HFA) 108 (90 BASE) MCG/ACT inhaler Inhale 2 puffs into the lungs every 6 (six) hours as needed for wheezing or shortness of breath. 09/18/13  Yes Waymon Budgelinton D Young, MD  aspirin EC 81 MG tablet Take 81 mg by mouth daily.   Yes Historical Provider, MD  cyclobenzaprine (FLEXERIL) 10 MG tablet Take 10 mg by mouth at bedtime as needed for muscle spasms.   Yes Historical Provider, MD  fexofenadine (ALLEGRA) 180 MG tablet Take 180 mg by mouth daily.     Yes Historical Provider, MD  lisinopril (PRINIVIL,ZESTRIL) 10 MG tablet Take 10 mg by mouth every morning.    Yes Historical Provider, MD  mometasone-formoterol (DULERA) 100-5 MCG/ACT AERO Inhale 2 puffs into the lungs 2  (two) times daily. Rinse mouth 06/01/13  Yes Waymon Budgelinton D Young, MD  olsalazine (DIPENTUM) 250 MG capsule Take 250 mg by mouth daily.    Yes Historical Provider, MD  oxyCODONE-acetaminophen (PERCOCET/ROXICET) 5-325 MG per tablet Take 1-2 tablets by mouth every 6 (six) hours as needed for severe pain. 10/01/13  Yes Charles B. Bernette MayersSheldon, MD  predniSONE (DELTASONE) 5 MG tablet Take 5 mg by mouth daily as needed (asthma).   Yes Historical Provider, MD  albuterol (PROVENTIL) (2.5 MG/3ML) 0.083% nebulizer solution Take 3 mLs (2.5 mg total) by nebulization every 4 (four) hours as needed for wheezing or shortness of breath. 11/17/12   Waymon Budgelinton D Young, MD   Allergies  Allergen Reactions  . Aspirin Hives  . Codeine Hives  . Fluticasone-Salmeterol Hives  . Montelukast Sodium Hives  . Adhesive [Tape] Rash  . Cephalexin Rash  . Ibuprofen Rash  . Latex Rash  . Penicillins Rash  . Statins Rash  . Sulfonamide Derivatives Rash    History  Substance Use Topics  . Smoking status: Former Smoker -- 1.00 packs/day for 37 years    Types: Cigarettes    Quit date: 08/23/1992  . Smokeless tobacco: Never Used  . Alcohol Use: Yes  Comment: socially     Family History  Problem Relation Age of Onset  . Alzheimer's disease Mother   . Liver disease Sister   . Lung cancer Brother      Review of Systems  Positive ROS: neg  All other systems have been reviewed and were otherwise negative with the exception of those mentioned in the HPI and as above.  Objective: Vital signs in last 24 hours: Temp:  [97.7 F (36.5 C)] 97.7 F (36.5 C) (03/18 1443) Pulse Rate:  [67] 67 (03/18 1443) Resp:  [20] 20 (03/18 1443) BP: (156)/(81) 156/81 mmHg (03/18 1443) SpO2:  [95 %] 95 % (03/18 1443)  General Appearance: Alert, cooperative, no distress, appears stated age Head: Normocephalic, without obvious abnormality, atraumatic Eyes: PERRL, conjunctiva/corneas clear, EOM's intact    Neck: Supple, symmetrical, trachea  midline Back: Symmetric, no curvature, ROM normal, no CVA tenderness Lungs:  respirations unlabored Heart: Regular rate and rhythm Abdomen: Soft, non-tender Extremities: Extremities normal, atraumatic, no cyanosis or edema Pulses: 2+ and symmetric all extremities Skin: Skin color, texture, turgor normal, no rashes or lesions  NEUROLOGIC:   Mental status: Alert and oriented x4,  no aphasia, good attention span, fund of knowledge, and memory Motor Exam - grossly normal Sensory Exam - grossly normal Reflexes: 1+ Coordination - grossly normal Gait - grossly normal Balance - grossly normal Cranial Nerves: I: smell Not tested  II: visual acuity  OS: nl    OD: nl  II: visual fields Full to confrontation  II: pupils Equal, round, reactive to light  III,VII: ptosis None  III,IV,VI: extraocular muscles  Full ROM  V: mastication Normal  V: facial light touch sensation  Normal  V,VII: corneal reflex  Present  VII: facial muscle function - upper  Normal  VII: facial muscle function - lower Normal  VIII: hearing Not tested  IX: soft palate elevation  Normal  IX,X: gag reflex Present  XI: trapezius strength  5/5  XI: sternocleidomastoid strength 5/5  XI: neck flexion strength  5/5  XII: tongue strength  Normal    Data Review Lab Results  Component Value Date   WBC 8.7 11/01/2013   HGB 14.3 11/01/2013   HCT 40.9 11/01/2013   MCV 92.1 11/01/2013   PLT 281 11/01/2013   Lab Results  Component Value Date   NA 142 11/01/2013   K 4.1 11/01/2013   CL 103 11/01/2013   CO2 24 11/01/2013   BUN 18 11/01/2013   CREATININE 0.92 11/01/2013   GLUCOSE 97 11/01/2013   Lab Results  Component Value Date   INR 0.99 11/01/2013    Assessment/Plan: Patient admitted for L laminectomy L23 discectomy. Patient has failed a reasonable attempt at conservative therapy.  I explained the condition and procedure to the patient and answered any questions.  Patient wishes to proceed with procedure as planned.  Understands risks/ benefits and typical outcomes of procedure.   Kristene Liberati S 11/07/2013 3:12 PM

## 2013-11-07 NOTE — Transfer of Care (Signed)
Immediate Anesthesia Transfer of Care Note  Patient: Thomas [name]  Procedure(s) Performed: Procedure(s): LUMBAR LAMINECTOMY/DECOMPRESSION MICRODISCECTOMY 1 LEVEL lumbar two/three (Left)  Patient Location: PACU  Anesthesia Type:General  Level of Consciousness: awake, alert , oriented and patient cooperative  Airway & Oxygen Therapy: Patient Spontanous Breathing and Patient connected to nasal cannula oxygen  Post-op Assessment: Report given to PACU RN, Post -op Vital signs reviewed and stable and Patient moving all extremities  Post vital signs: Reviewed and stable  Complications: No apparent anesthesia complications

## 2013-11-07 NOTE — Op Note (Signed)
11/07/2013  5:25 PM  PATIENT:  Thomas [name]  73 y.o. male  PRE-OPERATIVE DIAGNOSIS:  Left L2-3 extraforaminal herniated nucleus pulposus  POST-OPERATIVE DIAGNOSIS:  Same  PROCEDURE:  Left L2-3 extraforaminal microdiscectomy utilizing microscopic dissection  SURGEON:  Marikay Alaravid Jones, MD  ASSISTANTS: None  ANESTHESIA:   General  EBL: 50 ml  Total I/O In: 1400 [I.V.:1400] Out: 50 [Blood:50]  BLOOD ADMINISTERED:none  DRAINS: None   SPECIMEN:  No Specimen  INDICATION FOR PROCEDURE: This patient presented with severe left anterior thigh pain with numbness. MRI showed a large it foraminal disc herniation at L2-3 on the left compressing the left L2 nerve root. He tried medical management without relief. I recommended a left L2-3 microdiscectomy. Patient understood the risks, benefits, and alternatives and potential outcomes and wished to proceed.  PROCEDURE DETAILS: The patient was taken to the operating room and after induction of adequate generalized endotracheal anesthesia, the patient was rolled into the prone position on the Wilson frame and all pressure points were padded. The lumbar region was cleaned and then prepped with DuraPrep and draped in the usual sterile fashion. 5 cc of local anesthesia was injected and then a dorsal midline incision was made and carried down to the lumbo sacral fascia. The fascia was opened and the paraspinous musculature was taken down in a subperiosteal fashion to expose L2-3 on the left extraforaminal space. Intraoperative x-ray confirmed my level, and then I used a combination of the high-speed drill and the Kerrison punches to drill the lateral part of the pars and the top of the facet to perform an extraforaminal foraminotomy at L2-3 on the left. The underlying yellow ligament was opened and removed in a piecemeal fashion to expose the underlying exiting nerve root.  The nerve root was well decompressed. I identified a large subannular disc  herniation in the extraforaminal space, coagulated the epidural venous vasculature, and incised the disc space. A performed a thorough intradiscal discectomy with pituitary rongeurs and curettes, until I had a nice decompression of the nerve root. I then palpated with a coronary dilator along the nerve root and into the foramen to assure adequate decompression. I felt no more compression of the nerve root. I irrigated with saline solution containing bacitracin. Achieved hemostasis with bipolar cautery, lined the dura with Gelfoam, and then closed the fascia with 0 Vicryl. I closed the subcutaneous tissues with 2-0 Vicryl and the subcuticular tissues with 3-0 Vicryl. The skin was then closed with benzoin and Steri-Strips. The drapes were removed, a sterile dressing was applied. The patient was awakened from general anesthesia and transferred to the recovery room in stable condition. At the end of the procedure all sponge, needle and instrument counts were correct.   PLAN OF CARE: Admit for overnight observation  PATIENT DISPOSITION:  PACU - hemodynamically stable.   Delay start of Pharmacological VTE agent (>24hrs) due to surgical blood loss or risk of bleeding:  yes

## 2013-11-07 NOTE — Anesthesia Preprocedure Evaluation (Signed)
Anesthesia Evaluation  Patient identified by MRN, date of birth, ID band Patient awake    Reviewed: Allergy & Precautions, H&P , NPO status , Patient's Chart, lab work & pertinent test results  Airway       Dental   Pulmonary asthma , COPD COPD inhaler, former smoker,          Cardiovascular hypertension, + CAD, + CABG and + Peripheral Vascular Disease     Neuro/Psych    GI/Hepatic GERD-  ,  Endo/Other    Renal/GU      Musculoskeletal   Abdominal   Peds  Hematology   Anesthesia Other Findings   Reproductive/Obstetrics                           Anesthesia Physical Anesthesia Plan  ASA: III  Anesthesia Plan: General   Post-op Pain Management:    Induction: Intravenous  Airway Management Planned: Oral ETT  Additional Equipment:   Intra-op Plan:   Post-operative Plan: Extubation in OR  Informed Consent: I have reviewed the patients History and Physical, chart, labs and discussed the procedure including the risks, benefits and alternatives for the proposed anesthesia with the patient or authorized representative who has indicated his/her understanding and acceptance.     Plan Discussed with:   Anesthesia Plan Comments:         Anesthesia Quick Evaluation

## 2013-11-07 NOTE — Preoperative (Signed)
Beta Blockers   Reason not to administer Beta Blockers:Not Applicable 

## 2013-11-08 MED ORDER — TAMSULOSIN HCL 0.4 MG PO CAPS: 0.4000 mg | ORAL_CAPSULE | Freq: Every day | ORAL | Status: DC

## 2013-11-08 MED ORDER — OXYCODONE-ACETAMINOPHEN 5-325 MG PO TABS: 1.0000 | ORAL_TABLET | Freq: Four times a day (QID) | ORAL | Status: DC | PRN

## 2013-11-08 MED ORDER — TAMSULOSIN HCL 0.4 MG PO CAPS
0.4000 mg | ORAL_CAPSULE | Freq: Once | ORAL | Status: AC
  Administered 2013-11-08: 0.4 mg via ORAL
  Filled 2013-11-08: qty 1

## 2013-11-08 NOTE — Progress Notes (Signed)
Pt given D/C instructions with Rx's, verbal understanding of teaching was given. Pt D/C'd home via wheelchair @ 1830 per MD order. Pt is stable at D/C and has no other needs. Rema FendtAshley Jaleeah Slight, RN

## 2013-11-08 NOTE — Discharge Summary (Signed)
Physician Discharge Summary  Patient ID: Thomas [name] MRN: [id number] DOB/AGE: 1940-09-17 73 y.o.  Admit date: 11/07/2013 Discharge date: 11/08/2013  Admission Diagnoses: Extraforaminal disc herniation   Discharge Diagnoses: Same   Discharged Condition: good  Hospital Course: The patient was admitted on 11/07/2013 and taken to the operating room where the patient underwent left L2-3 extra foraminal microdiscectomy. The patient tolerated the procedure well and was taken to the recovery room and then to the floor in stable condition. The hospital course was routine. There were no complications. The wound remained clean dry and intact. Pt had appropriate back soreness. No complaints of leg pain or new N/T/W. The patient remained afebrile with stable vital signs, and tolerated a regular diet. The patient continued to increase activities, and pain was well controlled with oral pain medications.   Consults: cardiology  Significant Diagnostic Studies:  Results for orders placed during the hospital encounter of 11/01/13  SURGICAL PCR SCREEN      Result Value Ref Range   MRSA, PCR NEGATIVE  NEGATIVE   Staphylococcus aureus POSITIVE (*) NEGATIVE  BASIC METABOLIC PANEL      Result Value Ref Range   Sodium 142  137 - 147 mEq/L   Potassium 4.1  3.7 - 5.3 mEq/L   Chloride 103  96 - 112 mEq/L   CO2 24  19 - 32 mEq/L   Glucose, Bld 97  70 - 99 mg/dL   BUN 18  6 - 23 mg/dL   Creatinine, Ser 4.09  0.50 - 1.35 mg/dL   Calcium 9.2  8.4 - 81.1 mg/dL   GFR calc non Af Amer 82 (*) >90 mL/min   GFR calc Af Amer >90  >90 mL/min  CBC WITH DIFFERENTIAL      Result Value Ref Range   WBC 8.7  4.0 - 10.5 K/uL   RBC 4.44  4.22 - 5.81 MIL/uL   Hemoglobin 14.3  13.0 - 17.0 g/dL   HCT 91.4  78.2 - 95.6 %   MCV 92.1  78.0 - 100.0 fL   MCH 32.2  26.0 - 34.0 pg   MCHC 35.0  30.0 - 36.0 g/dL   RDW 21.3  08.6 - 57.8 %   Platelets 281  150 - 400 K/uL   Neutrophils Relative % 43  43 - 77 %   Neutro Abs  3.7  1.7 - 7.7 K/uL   Lymphocytes Relative 31  12 - 46 %   Lymphs Abs 2.7  0.7 - 4.0 K/uL   Monocytes Relative 10  3 - 12 %   Monocytes Absolute 0.8  0.1 - 1.0 K/uL   Eosinophils Relative 17 (*) 0 - 5 %   Eosinophils Absolute 1.4 (*) 0.0 - 0.7 K/uL   Basophils Relative 1  0 - 1 %   Basophils Absolute 0.1  0.0 - 0.1 K/uL  PROTIME-INR      Result Value Ref Range   Prothrombin Time 12.9  11.6 - 15.2 seconds   INR 0.99  0.00 - 1.49    Chest 2 View  11/01/2013   CLINICAL DATA:  Preoperative chest x-ray  EXAM: CHEST  2 VIEW  COMPARISON:  Prior chest x-ray 03/30/2012  FINDINGS: Some stable cardiac and mediastinal contours. Patient is status post median sternotomy with evidence of prior multivessel CABG including LIMA bypass. The lungs are negative for focal airspace consolidation, pulmonary edema, suspicious pulmonary nodule or mass. No pleural effusion or pneumothorax. Mild hyper expansion and central bronchitic changes suggest underlying  COPD. No acute osseous abnormality. Probable prominent right nipple shadow after correlation with prior CT scan of the chest.  IMPRESSION: 1. No active cardiopulmonary disease. 2. 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 3. Pulmonary hyperinflation and a central bronchitic changes suggest underlying COPD.   Electronically Signed   By: Malachy MoanHeath  McCullough M.D.   On: 11/01/2013 12:15   Dg Lumbar Spine 2-3 Views  11/07/2013   CLINICAL DATA:  L2-3 laminectomy.  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 The transitional segment is assigned S1 as correlated with prior studies.  Two cross-table lateral views of the lumbar spine are submitted from the operating room. The first image at 1620 hr demonstrates a  posterior localizing needle overlapping the L3 spinous process.  The second image at 1629 hr demonstrates skin spreaders posteriorly at L3-4 with a blunt surgical instrument directed towards the posterior aspect of the L3-4 disc space.  IMPRESSION: Intraoperative localization as described. 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 This is the level of the extruded disc fragment on recent MRI.   Electronically Signed   By: Roxy HorsemanBill  Veazey M.D.   On: 11/07/2013 19:41    Antibiotics:  Anti-infectives   Start     Dose/Rate Route Frequency Ordered Stop   11/08/13 0300  vancomycin (VANCOCIN) IVPB 1000 mg/200 mL premix     1,000 mg 200 mL/hr over 60 Minutes Intravenous  Once 11/07/13 2026 11/08/13 0315   11/07/13 2000  ceFAZolin (ANCEF) IVPB 1 g/50 mL premix  Status:  Discontinued     1 g 100 mL/hr over 30 Minutes Intravenous Every 8 hours 11/07/13 1946 11/07/13 2025   11/07/13 1648  bacitracin 50,000 Units in sodium chloride irrigation 0.9 % 500 mL irrigation  Status:  Discontinued       As needed 11/07/13 1648 11/07/13 1726   11/07/13 0600  vancomycin (VANCOCIN) IVPB 1000 mg/200 mL premix     1,000 mg 200 mL/hr over 60 Minutes Intravenous On call to O.R. 11/06/13 1344 11/07/13 1556      Discharge Exam: Blood pressure 122/72, pulse 91, temperature 97.7 F (36.5 C), temperature source Oral, resp. rate 16, SpO2 93.00%. Neurologic: Grossly normal Incision clean dry and intact  Discharge Medications:     Medication List         albuterol (2.5 MG/3ML) 0.083% nebulizer solution  Commonly known as:  PROVENTIL  Take 3 mLs (2.5 mg total) by nebulization every 4 (four) hours as needed for wheezing or shortness of breath.     albuterol 108 (90 BASE) MCG/ACT inhaler  Commonly known as:  PROAIR HFA  Inhale 2 puffs into the lungs every 6 (six) hours as needed for wheezing or shortness of  breath.     aspirin EC 81 MG tablet  Take 81 mg by mouth daily.     cyclobenzaprine 10 MG tablet  Commonly known as:  FLEXERIL  Take 10 mg by mouth at bedtime as needed for muscle spasms.     fexofenadine 180 MG tablet  Commonly known as:  ALLEGRA  Take 180 mg by mouth daily.     lisinopril 10 MG tablet  Commonly known as:  PRINIVIL,ZESTRIL  Take 10 mg by mouth every morning.     mometasone-formoterol 100-5 MCG/ACT Aero  Commonly known as:  DULERA  Inhale 2 puffs into the lungs 2 (two) times daily. Rinse mouth     olsalazine 250 MG capsule  Commonly known as:  DIPENTUM  Take 250 mg by mouth daily.     oxyCODONE-acetaminophen 5-325 MG per tablet  Commonly known as:  PERCOCET/ROXICET  Take 1-2 tablets by mouth every 6 (six) hours as needed for severe pain.     predniSONE 5 MG tablet  Commonly known as:  DELTASONE  Take 5 mg by mouth daily as needed (asthma).     tamsulosin 0.4 MG Caps capsule  Commonly known as:  FLOMAX  Take 1 capsule (0.4 mg total) by mouth daily.        Disposition: Home   Final Dx: Left L2-3 extraforaminal microdiscectomy      Discharge Orders   Future Appointments Provider Department Dept Phone   06/07/2014 10:00 AM Waymon Budge, MD [facility] [phone]   Future Orders Complete By Expires   Call MD for:  difficulty breathing, headache or visual disturbances  As directed    Call MD for:  persistant nausea and vomiting  As directed    Call MD for:  redness, tenderness, or signs of infection (pain, swelling, redness, odor or green/yellow discharge around incision site)  As directed    Call MD for:  severe uncontrolled pain  As directed    Call MD for:  temperature >100.4  As directed    Diet - low sodium heart healthy  As directed    Discharge instructions  As directed    Comments:     No lifting more than 8 lbs, no driving, no strenuous activity, may shower   Increase activity slowly  As directed    Remove dressing in 24  hours  As directed       Follow-up Information   Follow up with Zauria Dombek S, MD. Schedule an appointment as soon as possible for a visit in 3 weeks.   Specialty:  Neurosurgery   Contact information:   [address] [place] [address] [phone]        Signed: Tia Alert 11/08/2013, 5:59 PM

## 2013-11-08 NOTE — Anesthesia Postprocedure Evaluation (Signed)
  Anesthesia Post-op Note  Patient: Thomas BattlesHerman L Cottam  Procedure(s) Performed: Procedure(s): LUMBAR LAMINECTOMY/DECOMPRESSION MICRODISCECTOMY 1 LEVEL lumbar two/three (Left)  Patient Location: PACU  Anesthesia Type:General  Level of Consciousness: awake, alert , oriented and patient cooperative  Airway and Oxygen Therapy: Patient Spontanous Breathing  Post-op Pain: mild  Post-op Assessment: Post-op Vital signs reviewed, Patient's Cardiovascular Status Stable, Respiratory Function Stable, Patent Airway, No signs of Nausea or vomiting and Pain level controlled  Post-op Vital Signs: stable  Complications: No apparent anesthesia complications

## 2013-11-09 ENCOUNTER — Encounter (HOSPITAL_COMMUNITY): Payer: Self-pay | Admitting: Neurological Surgery

## 2014-01-01 ENCOUNTER — Other Ambulatory Visit: Payer: Self-pay | Admitting: Emergency Medicine

## 2014-01-01 MED ORDER — MOMETASONE FURO-FORMOTEROL FUM 100-5 MCG/ACT IN AERO: 2.0000 | INHALATION_SPRAY | Freq: Two times a day (BID) | RESPIRATORY_TRACT | Status: DC

## 2014-02-11 ENCOUNTER — Telehealth: Payer: Self-pay | Admitting: Internal Medicine

## 2014-02-11 MED ORDER — PREDNISONE 5 MG PO TABS: 5.0000 mg | ORAL_TABLET | Freq: Every day | ORAL | Status: DC | PRN

## 2014-02-11 NOTE — Telephone Encounter (Signed)
Pt aware RX has been sent in. Nothing further needed 

## 2014-02-15 ENCOUNTER — Other Ambulatory Visit: Payer: Self-pay | Admitting: Internal Medicine

## 2014-02-15 ENCOUNTER — Ambulatory Visit
Admission: RE | Admit: 2014-02-15 | Discharge: 2014-02-15 | Disposition: A | Discharge status: Home / Self Care | Payer: Medicare Other | Source: Ambulatory Visit | Attending: Internal Medicine | Admitting: Internal Medicine

## 2014-02-15 DIAGNOSIS — I714 Abdominal aortic aneurysm, without rupture, unspecified: Secondary | ICD-10-CM

## 2014-02-15 DIAGNOSIS — M25561 Pain in right knee: Secondary | ICD-10-CM

## 2014-02-25 ENCOUNTER — Ambulatory Visit
Admission: RE | Admit: 2014-02-25 | Discharge: 2014-02-25 | Disposition: A | Discharge status: Home / Self Care | Payer: Medicare Other | Source: Ambulatory Visit | Attending: Internal Medicine | Admitting: Internal Medicine

## 2014-02-25 DIAGNOSIS — I714 Abdominal aortic aneurysm, without rupture, unspecified: Secondary | ICD-10-CM

## 2014-02-27 ENCOUNTER — Other Ambulatory Visit: Payer: Self-pay | Admitting: Internal Medicine

## 2014-02-27 MED ORDER — ALBUTEROL SULFATE HFA 108 (90 BASE) MCG/ACT IN AERS: 2.0000 | INHALATION_SPRAY | Freq: Four times a day (QID) | RESPIRATORY_TRACT | Status: DC | PRN

## 2014-06-07 ENCOUNTER — Ambulatory Visit: Payer: Medicare Other | Admitting: Internal Medicine

## 2014-06-13 ENCOUNTER — Ambulatory Visit: Payer: Medicare Other | Admitting: Internal Medicine

## 2014-06-13 ENCOUNTER — Other Ambulatory Visit: Payer: Self-pay | Admitting: Internal Medicine

## 2014-06-13 MED ORDER — PREDNISONE 5 MG PO TABS: 5.0000 mg | ORAL_TABLET | Freq: Every day | ORAL | Status: DC | PRN

## 2014-07-08 ENCOUNTER — Encounter (INDEPENDENT_AMBULATORY_CARE_PROVIDER_SITE_OTHER): Payer: Self-pay

## 2014-07-08 ENCOUNTER — Encounter (INDEPENDENT_AMBULATORY_CARE_PROVIDER_SITE_OTHER): Payer: Medicare Other | Admitting: Internal Medicine

## 2014-07-08 NOTE — Progress Notes (Signed)
Patient ID: Thomas BattlesHerman L Jollie, male    DOB: 27-Aug-1940, 73 y.o.   MRN: [id number]  HPI 03/31/11- [age] yoM former smoker followed for allergic rhinitis, asthma, colitis Last here October 22, 2010 New cough starting 1 month ago- dry , with tussive left back pain= annoying. Cough several times daily. No cause or pattern recognized. He has been on lisinopril from Dr Shirlee LatchMcLean started in May. Not short of breath. Albuterol rescue inhaler or nebulizer used every other night- do help. He asks about going back on maintenance prednisone for this. Took some from Dr Judie PetitM. Laural BenesJohnson for colitis in June. CXR 06/15/10- stable and clear, NAD, old CABG  10/01/11- 70 yoM former smoker followed for allergic rhinitis, asthma, colitis Had flu vax and avoided any distinct resp inf this winter. Has had persistent nasal congestion, cough, drainage, white to yellow mucus x 1 month with no definite onset. Some wheeze. Using rescue inh ~1x/ day, dulera twice daily, neb ~ 1-2x/ month. Denies headache, fever, purulent or blood.  Persistent cough was not changed by using Benicar x 1 month so went back on lisinopril. Unlikely that cough is affected by ACE inhibitor. Feeling run down- asks permission to try multivitamin. Discussed ddx- he will first try mvi and discuss w/ PCP.   03/30/12- 70 yoM former smoker followed for allergic rhinitis, asthma, colitis Feels pain in Right lung area-backside; slight flare up with allergies due to weather changes. ? bronchitis-cough-productive-brown in color 4 or 5 weeks of pain under  right scapula-dull persistent ache. Brownish sputum, no fever "not sick". Not trying pain medication or heat. Legs okay. Tingling right supraclavicular area to right side of neck. Spent overnight in hospital for dehydration with syncope June 1, before this pain. CT chest 01/22/12 IMPRESSION:  1. No acute pulmonary embolus.  2. Atelectasis within both lung bases.  Original Report Authenticated By: Rosealee AlbeeAYLOR H. STROUD, M.D.    11/17/12- [age] yoM former smoker followed for allergic rhinitis, asthma, colitis FOLLOWS FOR: slight flare up at this time but using Allegra and getting relief. Minor cough comes and goes. No problems at with spring pollen as he continues Allegra 180. Asks smaller nebulizer machine for travel CXR 03/31/12 IMPRESSION:  There is no evidence of acute cardiac or pulmonary process.  Original Report Authenticated By: Brandon MelnickARON B. DOVER, M.D.  06/01/13- [age] yoM former smoker followed for allergic rhinitis, asthma, colitis FOLLOWS FOR:  cough x 2-3 days with brownish sputum.  no other symptoms.  Acute bronchitis, 3 days increased cough, brownish sputum. No fever or sore throat. Had flu vaccine  07/08/14- 73 yoM former smoker followed for allergic rhinitis, asthma, complicated by colitis, HBP, GERD   CXR 3/112/15 IMPRESSION: 1. No active cardiopulmonary disease. 2. 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 3. Pulmonary hyperinflation and a central bronchitic changes suggest underlying COPD.   Electronically Signed  By: Malachy MoanHeath McCullough M.D.  On: 11/01/2013 12:15 Review of Systems- see HPI Constitutional:   No-   weight loss, night sweats, fevers, chills, fatigue, +lassitude. HEENT:   No-  headaches, difficulty swallowing, tooth/dental problems, sore throat,       No-  sneezing, itching, ear ache,   +nasal congestion, post nasal drip,  CV:  , no-orthopnea, PND, swelling in lower extremities, anasarca, dizziness, palpitations Resp: No- unusual  shortness of breath with exertion or at rest.  + productive cough              No-  coughing up  of blood.              + change in color of mucus.  Some wheezing.   Skin: No-   rash or lesions. GI:  No- acute  heartburn, indigestion, abdominal pain, nausea, vomiting,  GU: MS:  No-   joint pain or swelling.    + back pain. Neuro- no-peripheral paresthesias  Psych:  No- change in mood or affect. No  depression or anxiety.  No memory loss.   Objective:   Physical Exam General- Alert, Oriented, Affect-appropriate, Distress- none acute  Overweight.  Skin- rash-none, lesions- none, excoriation- none.  Lymphadenopathy- none Head- atraumatic            Eyes- Gross vision intact, PERRLA, conjunctivae clear secretions  Chronic squint right eye            Ears- Hearing, canals normal            Nose- Clear,No- Septal dev, mucus, polyps, erosion, perforation             Throat- Mallampati III-IV , mucosa clear , drainage- none, tonsils- atrophic, + dentures Neck- flexible , trachea midline, no stridor , thyroid nl, carotid no bruit Chest - symmetrical excursion , unlabored           Heart/CV- RRR , no murmur , no gallop  , no rub, nl s1 s2                           - JVD- none , edema- none, stasis changes- none, varices- none           Lung- +mild wheeze in bases, dullness-none, rub- none, not coughing                              Chest wall-  Abd- protuberant Br/ Gen/ Rectal- Not done, not indicated Extrem- cyanosis- none, clubbing, none, atrophy- none, strength- nl Neuro- grossly intact to observation

## 2014-07-15 ENCOUNTER — Encounter: Payer: Self-pay | Admitting: Internal Medicine

## 2014-08-09 ENCOUNTER — Telehealth: Payer: Self-pay | Admitting: Internal Medicine

## 2014-08-09 MED ORDER — ALBUTEROL SULFATE HFA 108 (90 BASE) MCG/ACT IN AERS: 2.0000 | INHALATION_SPRAY | Freq: Four times a day (QID) | RESPIRATORY_TRACT | Status: DC | PRN

## 2014-08-09 NOTE — Telephone Encounter (Signed)
Pt requesting proair refill.  Refill sent, advised pt that he needed to keep rov in Jan for further refills.  Nothing further needed at this time.

## 2014-08-30 ENCOUNTER — Ambulatory Visit: Payer: Medicare Other | Admitting: Internal Medicine

## 2014-09-27 ENCOUNTER — Encounter: Payer: Self-pay | Admitting: Internal Medicine

## 2014-09-27 ENCOUNTER — Ambulatory Visit (INDEPENDENT_AMBULATORY_CARE_PROVIDER_SITE_OTHER): Payer: Medicare Other | Admitting: Internal Medicine

## 2014-09-27 ENCOUNTER — Ambulatory Visit: Payer: Medicare Other | Admitting: Internal Medicine

## 2014-09-27 ENCOUNTER — Ambulatory Visit (INDEPENDENT_AMBULATORY_CARE_PROVIDER_SITE_OTHER)
Admission: RE | Admit: 2014-09-27 | Discharge: 2014-09-27 | Disposition: A | Discharge status: Home / Self Care | Payer: Medicare Other | Source: Ambulatory Visit | Attending: Internal Medicine | Admitting: Internal Medicine

## 2014-09-27 VITALS — BP 136/72 | HR 64 | Ht 67.0 in | Wt 210.2 lb

## 2014-09-27 DIAGNOSIS — J441 Chronic obstructive pulmonary disease with (acute) exacerbation: Secondary | ICD-10-CM

## 2014-09-27 DIAGNOSIS — G4762 Sleep related leg cramps: Secondary | ICD-10-CM | POA: Insufficient documentation

## 2014-09-27 DIAGNOSIS — J4531 Mild persistent asthma with (acute) exacerbation: Secondary | ICD-10-CM

## 2014-09-27 MED ORDER — ALBUTEROL SULFATE HFA 108 (90 BASE) MCG/ACT IN AERS: 2.0000 | INHALATION_SPRAY | Freq: Four times a day (QID) | RESPIRATORY_TRACT | Status: DC | PRN

## 2014-09-27 MED ORDER — MOMETASONE FURO-FORMOTEROL FUM 100-5 MCG/ACT IN AERO: 2.0000 | INHALATION_SPRAY | Freq: Two times a day (BID) | RESPIRATORY_TRACT | Status: DC

## 2014-09-27 NOTE — Progress Notes (Signed)
Patient ID: Thomas [name], male    DOB: Mar 19, 1941, 74 y.o.   MRN: [id number]  HPI 03/31/11- 42 yoM former smoker followed for allergic rhinitis, asthma, colitis Last here October 22, 2010 New cough starting 1 month ago- dry , with tussive left back pain= annoying. Cough several times daily. No cause or pattern recognized. He has been on lisinopril from Dr Shirlee Latch started in May. Not short of breath. Albuterol rescue inhaler or nebulizer used every other night- do help. He asks about going back on maintenance prednisone for this. Took some from Dr Judie Petit. Laural Benes for colitis in June. CXR 06/15/10- stable and clear, NAD, old CABG  10/01/11- 70 yoM former smoker followed for allergic rhinitis, asthma, colitis Had flu vax and avoided any distinct resp inf this winter. Has had persistent nasal congestion, cough, drainage, white to yellow mucus x 1 month with no definite onset. Some wheeze. Using rescue inh ~1x/ day, dulera twice daily, neb ~ 1-2x/ month. Denies headache, fever, purulent or blood.  Persistent cough was not changed by using Benicar x 1 month so went back on lisinopril. Unlikely that cough is affected by ACE inhibitor. Feeling run down- asks permission to try multivitamin. Discussed ddx- he will first try mvi and discuss w/ PCP.   03/30/12- 70 yoM former smoker followed for allergic rhinitis, asthma, colitis Feels pain in Right lung area-backside; slight flare up with allergies due to weather changes. ? bronchitis-cough-productive-brown in color 4 or 5 weeks of pain under  right scapula-dull persistent ache. Brownish sputum, no fever "not sick". Not trying pain medication or heat. Legs okay. Tingling right supraclavicular area to right side of neck. Spent overnight in hospital for dehydration with syncope June 1, before this pain. CT chest 01/22/12 IMPRESSION:  1. No acute pulmonary embolus.  2. Atelectasis within both lung bases.  Original Report Authenticated By: Rosealee Albee, M.D.    11/17/12- 79 yoM former smoker followed for allergic rhinitis, asthma, colitis FOLLOWS FOR: slight flare up at this time but using Allegra and getting relief. Minor cough comes and goes. No problems at with spring pollen as he continues Allegra 180. Asks smaller nebulizer machine for travel CXR 03/31/12 IMPRESSION:  There is no evidence of acute cardiac or pulmonary process.  Original Report Authenticated By: Brandon Melnick, M.D.  06/01/13- 67 yoM former smoker followed for allergic rhinitis, asthma, colitis FOLLOWS FOR:  cough x 2-3 days with brownish sputum.  no other symptoms.  Acute bronchitis, 3 days increased cough, brownish sputum. No fever or sore throat. Had flu vaccine  09/27/14- 73 yoM former smoker followed for allergic rhinitis, asthma, complicated by colitis, HBP, GERD, CAD/ASVD/CABG FOLLOWS FOR: Recent bronchitis-got Abx from PCP and feeling better. Slight wheezing. Denies any SOB.  Had a cold 2 weeks ago treated by primary physician with erythromycin. Now doing better but still some cough, no longer productive. Using nebulizer with albuterol. Occasional prednisone 5 mg controls flares. Also complains of occasional leg cramps in his sleep at night CXR 3/112/15 IMPRESSION: 1. No active cardiopulmonary disease. 2. 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 3. Pulmonary hyperinflation and a central bronchitic changes suggest underlying COPD. Electronically Signed  By: Malachy Moan M.D.  On: 11/01/2013 12:15   Review of Systems- see HPI Constitutional:   No-   weight loss, night sweats, fevers, chills, fatigue, +lassitude. HEENT:   No-  headaches, difficulty swallowing, tooth/dental problems, sore throat,       No-  sneezing, itching, ear ache,   +nasal congestion, post nasal drip,  CV:  , no-orthopnea, PND, swelling in lower extremities, anasarca, dizziness, palpitations Resp: No- unusual  shortness of breath with  exertion or at rest.  + productive cough              No-  coughing up of blood.              + change in color of mucus.  Some wheezing.   Skin: No-   rash or lesions. GI:  No- acute  heartburn, indigestion, abdominal pain, nausea, vomiting,  GU: MS:  No-   joint pain or swelling.    + back pain. Neuro- no-peripheral paresthesias  Psych:  No- change in mood or affect. No depression or anxiety.  No memory loss.   Objective:   Physical Exam General- Alert, Oriented, Affect-appropriate, Distress- none acute  Overweight.  Skin- rash-none, lesions- none, excoriation- none.  Lymphadenopathy- none Head- atraumatic            Eyes- Gross vision intact, PERRLA, conjunctivae clear secretions  Chronic squint right eye            Ears- Hearing, canals normal            Nose- Clear,No- Septal dev, mucus, polyps, erosion, perforation             Throat- Mallampati III-IV , mucosa clear , drainage- none, tonsils- atrophic, + dentures Neck- flexible , trachea midline, no stridor , thyroid nl, carotid no bruit Chest - symmetrical excursion , unlabored           Heart/CV- RRR , no murmur , no gallop  , no rub, nl s1 s2                           - JVD- none , edema- none, stasis changes- none, varices- none           Lung- + wheeze on forced expiration, dullness-none, rub- none, not coughing                              Chest wall-  Abd- protuberant but has lost some weight Br/ Gen/ Rectal- Not done, not indicated Extrem- cyanosis- none, clubbing, none, atrophy- none, strength- nl Neuro- grossly intact to observation

## 2014-09-27 NOTE — Patient Instructions (Addendum)
Order- CXR  Dx chronic bronchitis exacerbation  Script sent for Proair inhaler and [facility]  Try a small glass of tonic water at supper time to see if the quinine in it will help the leg cramps. You can flavor with tomato or orange juice.  Please call as needed

## 2014-09-27 NOTE — Assessment & Plan Note (Signed)
These may be related to other problems, including his lumbar disc disease Plan-discussed stretching at bedtime, increasing magnesium/potassium in diet, home remedies

## 2014-09-27 NOTE — Assessment & Plan Note (Signed)
Recent acute exacerbation consistent with a wintertime viral bronchitis. Now improving slowly. Plan-chest x-ray, refill inhalers with discussion

## 2014-10-07 ENCOUNTER — Encounter: Payer: Self-pay | Admitting: Internal Medicine

## 2014-10-07 MED ORDER — ALBUTEROL SULFATE (2.5 MG/3ML) 0.083% IN NEBU: 2.5000 mg | INHALATION_SOLUTION | RESPIRATORY_TRACT | Status: DC | PRN

## 2014-10-08 ENCOUNTER — Telehealth: Payer: Self-pay

## 2014-10-08 ENCOUNTER — Telehealth: Payer: Self-pay | Admitting: Internal Medicine

## 2014-10-08 MED ORDER — MOMETASONE FURO-FORMOTEROL FUM 100-5 MCG/ACT IN AERO: 2.0000 | INHALATION_SPRAY | Freq: Two times a day (BID) | RESPIRATORY_TRACT | Status: DC

## 2014-10-08 NOTE — Telephone Encounter (Signed)
Spoke with Thomas [name] with [employer] to initiate PA over phone for [facility], this has been approved through 10/09/2015.  Reference #: [id number].  Dulera resent to pharmacy.  Nothing further needed.

## 2014-10-09 MED ORDER — CEFDINIR 300 MG PO CAPS: 300.0000 mg | ORAL_CAPSULE | Freq: Two times a day (BID) | ORAL | Status: DC

## 2014-10-09 NOTE — Telephone Encounter (Signed)
Called and spoke to pt. Pt stated he was given a zpak a couple weeks ago by PCP for bronchitis, pt stated he completed zpak 10 days ago and now his symptoms are coming back. Pt c/o prod cough with clear and yellow mucus and chest tightness. Pt denies change in SOB from baseline, denies f/c/s. Pt stated he is using his albuterol inhaler more than normal.   Dr. Maple HudsonYoung please advise.   Allergies  Allergen Reactions  . Aspirin Hives  . Codeine Hives  . Fluticasone-Salmeterol Hives  . Montelukast Sodium Hives  . Adhesive [Tape] Rash  . Cephalexin Rash  . Ibuprofen Rash  . Latex Rash  . Penicillins Rash  . Statins Rash  . Sulfonamide Derivatives Rash   Current Outpatient Prescriptions on File Prior to Visit  Medication Sig Dispense Refill  . albuterol (PROAIR HFA) 108 (90 BASE) MCG/ACT inhaler Inhale 2 puffs into the lungs every 6 (six) hours as needed for wheezing or shortness of breath. 1 Inhaler prn  . albuterol (PROVENTIL) (2.5 MG/3ML) 0.083% nebulizer solution Take 3 mLs (2.5 mg total) by nebulization every 4 (four) hours as needed for wheezing or shortness of breath. 150 mL 12  . aspirin EC 81 MG tablet Take 81 mg by mouth daily.    . fexofenadine (ALLEGRA) 180 MG tablet Take 180 mg by mouth daily.      Marland Kitchen. lisinopril (PRINIVIL,ZESTRIL) 10 MG tablet Take 10 mg by mouth every morning.     Marland Kitchen. olsalazine (DIPENTUM) 250 MG capsule Take 250 mg by mouth daily.     Marland Kitchen. oxyCODONE-acetaminophen (PERCOCET/ROXICET) 5-325 MG per tablet Take 1-2 tablets by mouth every 6 (six) hours as needed for severe pain. 30 tablet 0  . predniSONE (DELTASONE) 5 MG tablet Take 1 tablet (5 mg total) by mouth daily as needed (asthma). 30 tablet 5   No current facility-administered medications on file prior to visit.

## 2014-10-09 NOTE — Telephone Encounter (Signed)
Per Dr. Maple HudsonYoung, send in Cefdinir 300mg  BID #[id number]; Dr. Maple HudsonYoung says he is aware of pt's allergies and he feels this is the best option. [facility]  Patient notified.  Nothing further needed.

## 2014-10-10 ENCOUNTER — Encounter: Payer: Self-pay | Admitting: Internal Medicine

## 2014-10-11 MED ORDER — MOMETASONE FURO-FORMOTEROL FUM 100-5 MCG/ACT IN AERO: 2.0000 | INHALATION_SPRAY | Freq: Two times a day (BID) | RESPIRATORY_TRACT | Status: DC

## 2014-10-18 ENCOUNTER — Ambulatory Visit: Payer: Self-pay | Admitting: Internal Medicine

## 2014-12-10 ENCOUNTER — Telehealth: Payer: Self-pay | Admitting: Internal Medicine

## 2014-12-10 MED ORDER — PREDNISONE 5 MG PO TABS: 5.0000 mg | ORAL_TABLET | Freq: Every day | ORAL | Status: DC | PRN

## 2014-12-10 NOTE — Telephone Encounter (Signed)
Ok to refill 

## 2014-12-10 NOTE — Telephone Encounter (Signed)
rx sent for Prednisone. Nothing further needed.

## 2014-12-10 NOTE — Telephone Encounter (Signed)
[facility]  Patient states he takes 1 5mg  tablet daily for Asthma.  Did not see in last OV note.  Dr. Maple HudsonYoung, ok to refill? Last OV: 10/18/2014; next OV 09/29/2015  Current Outpatient Prescriptions on File Prior to Visit  Medication Sig Dispense Refill  . albuterol (PROAIR HFA) 108 (90 BASE) MCG/ACT inhaler Inhale 2 puffs into the lungs every 6 (six) hours as needed for wheezing or shortness of breath. 1 Inhaler prn  . albuterol (PROVENTIL) (2.5 MG/3ML) 0.083% nebulizer solution Take 3 mLs (2.5 mg total) by nebulization every 4 (four) hours as needed for wheezing or shortness of breath. 150 mL 12  . aspirin EC 81 MG tablet Take 81 mg by mouth daily.    . cefdinir (OMNICEF) 300 MG capsule Take 1 capsule (300 mg total) by mouth 2 (two) times daily. 14 capsule 0  . fexofenadine (ALLEGRA) 180 MG tablet Take 180 mg by mouth daily.      Marland Kitchen. lisinopril (PRINIVIL,ZESTRIL) 10 MG tablet Take 10 mg by mouth every morning.     . mometasone-formoterol (DULERA) 100-5 MCG/ACT AERO Inhale 2 puffs into the lungs 2 (two) times daily. Rinse mouth 1 Inhaler 5  . olsalazine (DIPENTUM) 250 MG capsule Take 250 mg by mouth daily.     Marland Kitchen. oxyCODONE-acetaminophen (PERCOCET/ROXICET) 5-325 MG per tablet Take 1-2 tablets by mouth every 6 (six) hours as needed for severe pain. 30 tablet 0  . predniSONE (DELTASONE) 5 MG tablet Take 1 tablet (5 mg total) by mouth daily as needed (asthma). 30 tablet 5   No current facility-administered medications on file prior to visit.   Allergies  Allergen Reactions  . Aspirin Hives  . Codeine Hives  . Fluticasone-Salmeterol Hives  . Montelukast Sodium Hives  . Adhesive [Tape] Rash  . Cephalexin Rash  . Ibuprofen Rash  . Latex Rash  . Penicillins Rash  . Statins Rash  . Sulfonamide Derivatives Rash

## 2015-01-06 ENCOUNTER — Ambulatory Visit
Admission: RE | Admit: 2015-01-06 | Discharge: 2015-01-06 | Disposition: A | Discharge status: Home / Self Care | Payer: Medicare Other | Source: Ambulatory Visit | Attending: Internal Medicine | Admitting: Internal Medicine

## 2015-01-06 ENCOUNTER — Other Ambulatory Visit: Payer: Self-pay | Admitting: Internal Medicine

## 2015-01-06 DIAGNOSIS — R1032 Left lower quadrant pain: Secondary | ICD-10-CM

## 2015-01-06 MED ORDER — IOHEXOL 300 MG/ML  SOLN
125.0000 mL | Freq: Once | INTRAMUSCULAR | Status: AC | PRN
  Administered 2015-01-06: 125 mL via INTRAVENOUS

## 2015-02-12 ENCOUNTER — Other Ambulatory Visit: Payer: Self-pay | Admitting: Gastroenterology

## 2015-05-07 NOTE — Progress Notes (Signed)
05-07-15 1015 Confirmed with patient "he cancelled procedure with Dr. Laural Benes office" for 05-19-15.

## 2015-05-19 ENCOUNTER — Ambulatory Visit (HOSPITAL_COMMUNITY): Admission: RE | Admit: 2015-05-19 | Payer: Medicare Other | Source: Ambulatory Visit | Admitting: Gastroenterology

## 2015-05-19 ENCOUNTER — Encounter (HOSPITAL_COMMUNITY): Admission: RE | Payer: Self-pay | Source: Ambulatory Visit

## 2015-05-19 SURGERY — COLONOSCOPY WITH PROPOFOL
Anesthesia: Monitor Anesthesia Care

## 2015-08-11 ENCOUNTER — Other Ambulatory Visit: Payer: Self-pay | Admitting: Internal Medicine

## 2015-08-11 DIAGNOSIS — I714 Abdominal aortic aneurysm, without rupture, unspecified: Secondary | ICD-10-CM

## 2015-09-11 ENCOUNTER — Ambulatory Visit
Admission: RE | Admit: 2015-09-11 | Discharge: 2015-09-11 | Disposition: A | Discharge status: Home / Self Care | Payer: Medicare Other | Source: Ambulatory Visit | Attending: Internal Medicine | Admitting: Internal Medicine

## 2015-09-11 DIAGNOSIS — I714 Abdominal aortic aneurysm, without rupture, unspecified: Secondary | ICD-10-CM

## 2015-09-29 ENCOUNTER — Ambulatory Visit (INDEPENDENT_AMBULATORY_CARE_PROVIDER_SITE_OTHER)
Admission: RE | Admit: 2015-09-29 | Discharge: 2015-09-29 | Disposition: A | Discharge status: Home / Self Care | Payer: Medicare Other | Source: Ambulatory Visit | Attending: Internal Medicine | Admitting: Internal Medicine

## 2015-09-29 ENCOUNTER — Ambulatory Visit (INDEPENDENT_AMBULATORY_CARE_PROVIDER_SITE_OTHER): Payer: Medicare Other | Admitting: Internal Medicine

## 2015-09-29 ENCOUNTER — Encounter: Payer: Self-pay | Admitting: Internal Medicine

## 2015-09-29 VITALS — BP 126/70 | HR 70 | Ht 67.0 in | Wt 202.8 lb

## 2015-09-29 DIAGNOSIS — J4541 Moderate persistent asthma with (acute) exacerbation: Secondary | ICD-10-CM

## 2015-09-29 DIAGNOSIS — J45901 Unspecified asthma with (acute) exacerbation: Secondary | ICD-10-CM | POA: Diagnosis not present

## 2015-09-29 DIAGNOSIS — K219 Gastro-esophageal reflux disease without esophagitis: Secondary | ICD-10-CM

## 2015-09-29 MED ORDER — ALBUTEROL SULFATE HFA 108 (90 BASE) MCG/ACT IN AERS: 2.0000 | INHALATION_SPRAY | Freq: Four times a day (QID) | RESPIRATORY_TRACT | Status: DC | PRN

## 2015-09-29 MED ORDER — MOMETASONE FURO-FORMOTEROL FUM 200-5 MCG/ACT IN AERO: INHALATION_SPRAY | RESPIRATORY_TRACT | Status: DC

## 2015-09-29 NOTE — Patient Instructions (Signed)
Sample and script  Dulera 200     2 puffs then rinse mouth, twice daily  Maintenance  Script refill albuterol rescue inhaler  Order- CXR   Dx asthmatic bronchitis without exacerbation  Order- office spirometry

## 2015-09-29 NOTE — Progress Notes (Signed)
Patient ID: Thomas [name], male    DOB: Jan 16, 1941, 75 y.o.   MRN: [id number]  HPI 03/31/11- 77 yoM former smoker followed for allergic rhinitis, asthma, colitis Last here October 22, 2010 New cough starting 1 month ago- dry , with tussive left back pain= annoying. Cough several times daily. No cause or pattern recognized. He has been on lisinopril from Dr Shirlee Latch started in May. Not short of breath. Albuterol rescue inhaler or nebulizer used every other night- do help. He asks about going back on maintenance prednisone for this. Took some from Dr Judie Petit. Laural Benes for colitis in June. CXR 06/15/10- stable and clear, NAD, old CABG  10/01/11- 70 yoM former smoker followed for allergic rhinitis, asthma, colitis Had flu vax and avoided any distinct resp inf this winter. Has had persistent nasal congestion, cough, drainage, white to yellow mucus x 1 month with no definite onset. Some wheeze. Using rescue inh ~1x/ day, dulera twice daily, neb ~ 1-2x/ month. Denies headache, fever, purulent or blood.  Persistent cough was not changed by using Benicar x 1 month so went back on lisinopril. Unlikely that cough is affected by ACE inhibitor. Feeling run down- asks permission to try multivitamin. Discussed ddx- he will first try mvi and discuss w/ PCP.   03/30/12- 70 yoM former smoker followed for allergic rhinitis, asthma, colitis Feels pain in Right lung area-backside; slight flare up with allergies due to weather changes. ? bronchitis-cough-productive-brown in color 4 or 5 weeks of pain under  right scapula-dull persistent ache. Brownish sputum, no fever "not sick". Not trying pain medication or heat. Legs okay. Tingling right supraclavicular area to right side of neck. Spent overnight in hospital for dehydration with syncope June 1, before this pain. CT chest 01/22/12 IMPRESSION:  1. No acute pulmonary embolus.  2. Atelectasis within both lung bases.  Original Report Authenticated By: Rosealee Albee, M.D.    11/17/12- 1 yoM former smoker followed for allergic rhinitis, asthma, colitis FOLLOWS FOR: slight flare up at this time but using Allegra and getting relief. Minor cough comes and goes. No problems at with spring pollen as he continues Allegra 180. Asks smaller nebulizer machine for travel CXR 03/31/12 IMPRESSION:  There is no evidence of acute cardiac or pulmonary process.  Original Report Authenticated By: Brandon Melnick, M.D.  06/01/13- 23 yoM former smoker followed for allergic rhinitis, asthma, colitis FOLLOWS FOR:  cough x 2-3 days with brownish sputum.  no other symptoms.  Acute bronchitis, 3 days increased cough, brownish sputum. No fever or sore throat. Had flu vaccine  09/27/14- 73 yoM former smoker followed for allergic rhinitis, asthma, complicated by colitis, HBP, GERD, CAD/ASVD/CABG FOLLOWS FOR: Recent bronchitis-got Abx from PCP and feeling better. Slight wheezing. Denies any SOB.  Had a cold 2 weeks ago treated by primary physician with erythromycin. Now doing better but still some cough, no longer productive. Using nebulizer with albuterol. Occasional prednisone 5 mg controls flares. Also complains of occasional leg cramps in his sleep at night CXR 3/112/15 IMPRESSION: 1. No active cardiopulmonary disease. 2. 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 3. Pulmonary hyperinflation and a central bronchitic changes suggest underlying COPD. Electronically Signed  By: Malachy Moan M.D.  On: 11/01/2013 12:15  09/29/2015-75 year old male former smoker followed for allergic rhinitis, asthma, complicated by colitis, HBP, GERD, CAD/ASVD/CABG FOLLOWS FOR: Pt states he had slight wheezing and touch of possible bronchitis recently -took Prednisone rx to get through that. Otherwise doing well.  Wheezing more in the past week and has been taking prednisone 5 mg daily since this started. Scant clear mucus. Using Dulera 1 puff twice  daily and occasional rescue inhaler. Office Spirometry 09/29/2015-moderately severe obstruction. FVC 2.63/67%, FEV1 1.63/54%, FEV1/FVC 0.62,  Review of Systems- see HPI Constitutional:   No-   weight loss, night sweats, fevers, chills, fatigue, +lassitude. HEENT:   No-  headaches, difficulty swallowing, tooth/dental problems, sore throat,       No-  sneezing, itching, ear ache,   +nasal congestion, post nasal drip,  CV:  , no-orthopnea, PND, swelling in lower extremities, anasarca, dizziness, palpitations Resp: No- unusual  shortness of breath with exertion or at rest.  + productive cough              No-  coughing up of blood.              + change in color of mucus.  Some wheezing.   Skin: No-   rash or lesions. GI:  No- acute  heartburn, indigestion, abdominal pain, nausea, vomiting,  GU: MS:  No-   joint pain or swelling.    + back pain. Neuro- no-peripheral paresthesias  Psych:  No- change in mood or affect. No depression or anxiety.  No memory loss.   Objective:   Physical Exam General- Alert, Oriented, Affect-appropriate, Distress- none acute  Overweight.  Skin- rash-none, lesions- none, excoriation- none.  Lymphadenopathy- none Head- atraumatic            Eyes- Gross vision intact, PERRLA, conjunctivae clear secretions  Chronic squint right eye            Ears- Hearing, canals normal            Nose- Clear,No- Septal dev, mucus, polyps, erosion, perforation             Throat- Mallampati III-IV , mucosa clear , drainage- none, tonsils- atrophic, + dentures Neck- flexible , trachea midline, no stridor , thyroid nl, carotid no bruit Chest - symmetrical excursion , unlabored           Heart/CV- RRR , no murmur , no gallop  , no rub, nl s1 s2                           - JVD- none , edema- none, stasis changes- none, varices- none           Lung- + wheeze bilateral unlabored, dullness-none, rub- none, not coughing                              Chest wall-  Abd- Br/ Gen/ Rectal-  Not done, not indicated Extrem- cyanosis- none, clubbing, none, atrophy- none, strength- nl Neuro- grossly intact to observation

## 2015-09-30 NOTE — Assessment & Plan Note (Signed)
Emphasized reflux precautions 

## 2015-09-30 NOTE — Assessment & Plan Note (Signed)
Chronic persistent asthmatic bronchitis probably with mild exacerbation recently. Spirometry scores show more slowing relative to 2010 PFT. Not sure how this relates to baseline. Plan-sample and prescription increasing Dulera 200 and recommended 2 puffs twice a day, refill albuterol, medication education.

## 2015-10-01 ENCOUNTER — Telehealth: Payer: Self-pay | Admitting: Internal Medicine

## 2015-10-01 NOTE — Progress Notes (Signed)
Quick Note:  Called and spoke with pt's wife. Reviewed results and recs. Pt voiced understanding and had no further questions. ______ 

## 2015-10-01 NOTE — Telephone Encounter (Signed)
Notes Recorded by Waymon Budge, MD on 09/29/2015 at 8:42 PM CXR shows COPD and old cardiac surgery changes, stable. No new process seen.  Called and spoke with the patient;s wife. Reviewed results and recs. She voiced understanding and had no further questions.

## 2016-01-12 ENCOUNTER — Ambulatory Visit
Admission: RE | Admit: 2016-01-12 | Discharge: 2016-01-12 | Disposition: A | Discharge status: Home / Self Care | Payer: Medicare Other | Source: Ambulatory Visit | Attending: Internal Medicine | Admitting: Internal Medicine

## 2016-01-12 ENCOUNTER — Other Ambulatory Visit: Payer: Self-pay | Admitting: Internal Medicine

## 2016-01-12 DIAGNOSIS — M25512 Pain in left shoulder: Secondary | ICD-10-CM

## 2016-03-25 ENCOUNTER — Other Ambulatory Visit: Payer: Self-pay | Admitting: Gastroenterology

## 2016-04-21 ENCOUNTER — Encounter (HOSPITAL_COMMUNITY): Payer: Self-pay | Admitting: *Deleted

## 2016-04-27 ENCOUNTER — Ambulatory Visit (HOSPITAL_COMMUNITY): Payer: Medicare Other | Admitting: Anesthesiology

## 2016-04-27 ENCOUNTER — Ambulatory Visit (HOSPITAL_COMMUNITY)
Admission: RE | Admit: 2016-04-27 | Discharge: 2016-04-27 | Disposition: A | Discharge status: Home / Self Care | Payer: Medicare Other | Source: Ambulatory Visit | Attending: Gastroenterology | Admitting: Gastroenterology

## 2016-04-27 ENCOUNTER — Encounter (HOSPITAL_COMMUNITY): Payer: Self-pay | Admitting: Anesthesiology

## 2016-04-27 ENCOUNTER — Encounter (HOSPITAL_COMMUNITY)
Admission: RE | Disposition: A | Discharge status: Home / Self Care | Payer: Self-pay | Source: Ambulatory Visit | Attending: Gastroenterology

## 2016-04-27 DIAGNOSIS — K5190 Ulcerative colitis, unspecified, without complications: Secondary | ICD-10-CM | POA: Diagnosis not present

## 2016-04-27 DIAGNOSIS — Z1211 Encounter for screening for malignant neoplasm of colon: Secondary | ICD-10-CM | POA: Diagnosis present

## 2016-04-27 DIAGNOSIS — J449 Chronic obstructive pulmonary disease, unspecified: Secondary | ICD-10-CM | POA: Insufficient documentation

## 2016-04-27 DIAGNOSIS — Z951 Presence of aortocoronary bypass graft: Secondary | ICD-10-CM | POA: Insufficient documentation

## 2016-04-27 DIAGNOSIS — Z7982 Long term (current) use of aspirin: Secondary | ICD-10-CM | POA: Diagnosis not present

## 2016-04-27 DIAGNOSIS — Z87891 Personal history of nicotine dependence: Secondary | ICD-10-CM | POA: Diagnosis not present

## 2016-04-27 DIAGNOSIS — M1990 Unspecified osteoarthritis, unspecified site: Secondary | ICD-10-CM | POA: Diagnosis not present

## 2016-04-27 DIAGNOSIS — I10 Essential (primary) hypertension: Secondary | ICD-10-CM | POA: Insufficient documentation

## 2016-04-27 DIAGNOSIS — Z79899 Other long term (current) drug therapy: Secondary | ICD-10-CM | POA: Insufficient documentation

## 2016-04-27 DIAGNOSIS — Z7952 Long term (current) use of systemic steroids: Secondary | ICD-10-CM | POA: Insufficient documentation

## 2016-04-27 DIAGNOSIS — K5730 Diverticulosis of large intestine without perforation or abscess without bleeding: Secondary | ICD-10-CM | POA: Insufficient documentation

## 2016-04-27 DIAGNOSIS — I2510 Atherosclerotic heart disease of native coronary artery without angina pectoris: Secondary | ICD-10-CM | POA: Diagnosis not present

## 2016-04-27 DIAGNOSIS — K219 Gastro-esophageal reflux disease without esophagitis: Secondary | ICD-10-CM | POA: Diagnosis not present

## 2016-04-27 HISTORY — PX: COLONOSCOPY WITH PROPOFOL: SHX5780

## 2016-04-27 HISTORY — DX: Other injury of unspecified body region, initial encounter: T14.8XXA

## 2016-04-27 SURGERY — COLONOSCOPY WITH PROPOFOL
Anesthesia: Monitor Anesthesia Care

## 2016-04-27 MED ORDER — PROPOFOL 10 MG/ML IV BOLUS
INTRAVENOUS | Status: DC | PRN
  Administered 2016-04-27 (×2): 20 mg via INTRAVENOUS

## 2016-04-27 MED ORDER — SODIUM CHLORIDE 0.9 % IV SOLN: INTRAVENOUS | Status: DC

## 2016-04-27 MED ORDER — EPHEDRINE SULFATE 50 MG/ML IJ SOLN
INTRAMUSCULAR | Status: DC | PRN
  Administered 2016-04-27 (×2): 5 mg via INTRAVENOUS

## 2016-04-27 MED ORDER — PROPOFOL 500 MG/50ML IV EMUL
INTRAVENOUS | Status: DC | PRN
  Administered 2016-04-27: 140 ug/kg/min via INTRAVENOUS

## 2016-04-27 MED ORDER — FENTANYL CITRATE (PF) 100 MCG/2ML IJ SOLN
INTRAMUSCULAR | Status: AC
Start: 2016-04-27 — End: 2016-04-27
  Filled 2016-04-27: qty 2

## 2016-04-27 MED ORDER — EPHEDRINE 5 MG/ML INJ
INTRAVENOUS | Status: AC
  Filled 2016-04-27: qty 10

## 2016-04-27 MED ORDER — LACTATED RINGERS IV SOLN
INTRAVENOUS | Status: DC
  Administered 2016-04-27: 1000 mL via INTRAVENOUS

## 2016-04-27 MED ORDER — LIDOCAINE 2% (20 MG/ML) 5 ML SYRINGE
INTRAMUSCULAR | Status: AC
  Filled 2016-04-27: qty 5

## 2016-04-27 MED ORDER — PROPOFOL 10 MG/ML IV BOLUS
INTRAVENOUS | Status: AC
  Filled 2016-04-27: qty 40

## 2016-04-27 MED ORDER — FENTANYL CITRATE (PF) 100 MCG/2ML IJ SOLN
INTRAMUSCULAR | Status: DC | PRN
  Administered 2016-04-27: 50 ug via INTRAVENOUS

## 2016-04-27 MED ORDER — LIDOCAINE 2% (20 MG/ML) 5 ML SYRINGE
INTRAMUSCULAR | Status: DC | PRN
  Administered 2016-04-27: 100 mg via INTRAVENOUS

## 2016-04-27 SURGICAL SUPPLY — 22 items

## 2016-04-27 NOTE — Transfer of Care (Signed)
Immediate Anesthesia Transfer of Care Note  Patient: Thomas [name]  Procedure(s) Performed: Procedure(s): COLONOSCOPY WITH PROPOFOL (N/A)  Patient Location: Endoscopy Unit  Anesthesia Type:MAC  Level of Consciousness: awake  Airway & Oxygen Therapy: Patient Spontanous Breathing and Patient connected to face mask oxygen  Post-op Assessment: Report given to RN and Post -op Vital signs reviewed and stable  Post vital signs: Reviewed and stable  Last Vitals:  Vitals:   04/27/16 0939  BP: (!) 169/82  Pulse: 65  Resp: 10  Temp: 36.5 C    Last Pain:  Vitals:   04/27/16 0939  TempSrc: Oral         Complications: No apparent anesthesia complications

## 2016-04-27 NOTE — H&P (Signed)
  Procedure: Surveillance colonoscopy. Universal ulcerative colitis was diagnosed in 2007.  History: The patient is a 75 year old male born 1940/11/16. Symptomatically, his universal ulcerative colitis is in remission. He is scheduled to undergo a surveillance colonoscopy today.  Past medical history: Coronary artery bypass grafting in 2006. Hypercholesterolemia. Hypertension. Universal ulcerative colitis diagnosed in 2007. Chronic obstructive pulmonary disease. Left carpal tunnel syndrome. Peripheral neuropathy. Osteoarthritis. Right cataract surgery. Circumcision. Sinus surgery.  Medication allergies: Codeine. Penicillin. Keflex. Adhesive tape. Sulfa. Statin drugs.  Exam: The patient is alert and lying comfortably on the endoscopy stretcher. Abdomen is soft and nontender to palpation. Lungs are clear to auscultation. Cardiac exam reveals a regular rhythm.  Plan: Proceed with surveillance colonoscopy

## 2016-04-27 NOTE — Op Note (Signed)
[facility] Patient Name: Thomas [name] Procedure Date: 04/27/2016 MRN: [id number] Attending MD: Charolett Bumpers , MD Date of Birth: 04-Jan-1941 CSN: [id number] Age: 75 Admit Type: Outpatient Procedure:                Colonoscopy Indications:              Screening for colorectal malignant neoplasm:                            Chronic ulcerative pancolitis since 2007 Providers:                Charolett Bumpers, MD, Janae Sauce. Steele Berg, RN, Rolm Bookbinder, Technician, Leroy Libman, CRNA Referring MD:              Medicines:                Propofol per Anesthesia Complications:            No immediate complications. Estimated Blood Loss:     Estimated blood loss: none. Procedure:                Pre-Anesthesia Assessment:                           - Prior to the procedure, a History and Physical                            was performed, and patient medications and                            allergies were reviewed. The patient's tolerance of                            previous anesthesia was also reviewed. The risks                            and benefits of the procedure and the sedation                            options and risks were discussed with the patient.                            All questions were answered, and informed consent                            was obtained. Prior Anticoagulants: The patient has                            taken aspirin, last dose was 1 day prior to                            procedure. ASA Grade Assessment: III - A patient  with severe systemic disease. After reviewing the                            risks and benefits, the patient was deemed in                            satisfactory condition to undergo the procedure.                           After obtaining informed consent, the colonoscope                            was passed under direct vision. Throughout the                procedure, the patient's blood pressure, pulse, and                            oxygen saturations were monitored continuously. The                            EC-[id number] ([id number]) scope was introduced through                            the anus and advanced to the the cecum, identified                            by appendiceal orifice and ileocecal valve. The                            colonoscopy was performed without difficulty. The                            patient tolerated the procedure well. The quality                            of the bowel preparation was good. The appendiceal                            orifice and the rectum were photographed. Scope In: 9:54:24 AM Scope Out: 10:15:15 AM Scope Withdrawal Time: 0 hours 15 minutes 26 seconds  Total Procedure Duration: 0 hours 20 minutes 51 seconds  Findings:      The perianal and digital rectal examinations were normal.      Four biopsies were taken every 10 cm with a cold forceps from the cecum,       ascending colon, right transverse colon, left transverse colon,       descending colon, sigmoid colon and rectum for ulcerative colitis       surveillance. These biopsy specimens were sent to Pathology.      The entire examined colon appeared normal. Left colonic diverticulosis       was present dut no sighs of active colitis or neoplasia. Impression:               - The entire examined colon is normal.                           -  Biopsies for surveillance were taken from the                            cecum, ascending colon, right transverse colon,                            left transverse colon, descending colon, sigmoid                            colon and rectum. Moderate Sedation:      N/A- Per Anesthesia Care Recommendation:           - Patient has a contact number available for                            emergencies. The signs and symptoms of potential                            delayed complications were  discussed with the                            patient. Return to normal activities tomorrow.                            Written discharge instructions were provided to the                            patient.                           - Repeat colonoscopy date to be determined after                            pending pathology results are reviewed for                            screening purposes.                           - Resume previous diet.                           - Continue present medications. Procedure Code(s):        --- Professional ---                           [id number], Colonoscopy, flexible; with biopsy, single                            or multiple Diagnosis Code(s):        --- Professional ---                           Z12.11, Encounter for screening for malignant                            neoplasm of colon CPT  copyright 2016 American Medical Association. All rights reserved. The codes documented in this report are preliminary and upon coder review may  be revised to meet current compliance requirements. Danise Edge, MD Charolett Bumpers, MD 04/27/2016 10:25:56 AM This report has been signed electronically. Number of Addenda: 0

## 2016-04-27 NOTE — Anesthesia Preprocedure Evaluation (Signed)
Anesthesia Evaluation  Patient identified by MRN, date of birth, ID band Patient awake    Reviewed: Allergy & Precautions, NPO status , Patient's Chart, lab work & pertinent test results  Airway Mallampati: I  TM Distance: >3 FB Neck ROM: Full    Dental   Pulmonary former smoker,    Pulmonary exam normal        Cardiovascular hypertension, + CAD and + CABG  Normal cardiovascular exam     Neuro/Psych    GI/Hepatic GERD  Medicated and Controlled,  Endo/Other    Renal/GU      Musculoskeletal   Abdominal   Peds  Hematology   Anesthesia Other Findings   Reproductive/Obstetrics                             Anesthesia Physical Anesthesia Plan  ASA: III  Anesthesia Plan: MAC   Post-op Pain Management:    Induction: Intravenous  Airway Management Planned: Simple Face Mask  Additional Equipment:   Intra-op Plan:   Post-operative Plan:   Informed Consent: I have reviewed the patients History and Physical, chart, labs and discussed the procedure including the risks, benefits and alternatives for the proposed anesthesia with the patient or authorized representative who has indicated his/her understanding and acceptance.     Plan Discussed with: CRNA and Surgeon  Anesthesia Plan Comments:         Anesthesia Quick Evaluation

## 2016-04-27 NOTE — Anesthesia Postprocedure Evaluation (Signed)
Anesthesia Post Note  Patient: Thomas [name]  Procedure(s) Performed: Procedure(s) (LRB): COLONOSCOPY WITH PROPOFOL (N/A)  Patient location during evaluation: PACU Anesthesia Type: MAC Level of consciousness: awake and alert Pain management: pain level controlled Vital Signs Assessment: post-procedure vital signs reviewed and stable Respiratory status: spontaneous breathing, nonlabored ventilation, respiratory function stable and patient connected to nasal cannula oxygen Cardiovascular status: stable and blood pressure returned to baseline Anesthetic complications: no    Last Vitals:  Vitals:   04/27/16 1030 04/27/16 1040  BP: 133/77 (!) 148/75  Pulse: 78 (!) 58  Resp: 20 14  Temp:      Last Pain:  Vitals:   04/27/16 0939  TempSrc: Oral                 Joniel Graumann DAVID

## 2016-04-27 NOTE — Discharge Instructions (Signed)

## 2016-04-28 ENCOUNTER — Encounter (HOSPITAL_COMMUNITY): Payer: Self-pay | Admitting: Gastroenterology

## 2016-09-01 ENCOUNTER — Other Ambulatory Visit: Payer: Self-pay | Admitting: Internal Medicine

## 2016-09-22 ENCOUNTER — Ambulatory Visit
Admission: RE | Admit: 2016-09-22 | Discharge: 2016-09-22 | Disposition: A | Discharge status: Home / Self Care | Payer: Medicare Other | Source: Ambulatory Visit | Attending: Internal Medicine | Admitting: Internal Medicine

## 2016-09-22 ENCOUNTER — Other Ambulatory Visit: Payer: Self-pay | Admitting: Internal Medicine

## 2016-09-22 DIAGNOSIS — M79642 Pain in left hand: Secondary | ICD-10-CM

## 2016-09-28 ENCOUNTER — Ambulatory Visit: Payer: Medicare Other | Admitting: Internal Medicine

## 2016-09-29 ENCOUNTER — Encounter: Payer: Self-pay | Admitting: Internal Medicine

## 2016-09-29 ENCOUNTER — Ambulatory Visit (INDEPENDENT_AMBULATORY_CARE_PROVIDER_SITE_OTHER)
Admission: RE | Admit: 2016-09-29 | Discharge: 2016-09-29 | Disposition: A | Discharge status: Home / Self Care | Payer: Medicare Other | Source: Ambulatory Visit | Attending: Internal Medicine | Admitting: Internal Medicine

## 2016-09-29 ENCOUNTER — Ambulatory Visit (INDEPENDENT_AMBULATORY_CARE_PROVIDER_SITE_OTHER): Payer: Medicare Other | Admitting: Internal Medicine

## 2016-09-29 VITALS — BP 130/72 | HR 62 | Ht 67.0 in | Wt 203.6 lb

## 2016-09-29 DIAGNOSIS — J449 Chronic obstructive pulmonary disease, unspecified: Secondary | ICD-10-CM

## 2016-09-29 DIAGNOSIS — K219 Gastro-esophageal reflux disease without esophagitis: Secondary | ICD-10-CM | POA: Diagnosis not present

## 2016-09-29 DIAGNOSIS — J3089 Other allergic rhinitis: Secondary | ICD-10-CM

## 2016-09-29 DIAGNOSIS — J302 Other seasonal allergic rhinitis: Secondary | ICD-10-CM

## 2016-09-29 MED ORDER — ALBUTEROL SULFATE HFA 108 (90 BASE) MCG/ACT IN AERS: INHALATION_SPRAY | RESPIRATORY_TRACT | 12 refills | Status: DC

## 2016-09-29 MED ORDER — UMECLIDINIUM-VILANTEROL 62.5-25 MCG/INH IN AEPB: 1.0000 | INHALATION_SPRAY | Freq: Every day | RESPIRATORY_TRACT | 0 refills | Status: DC

## 2016-09-29 MED ORDER — AZELASTINE-FLUTICASONE 137-50 MCG/ACT NA SUSP: 1.0000 | Freq: Every day | NASAL | 99 refills | Status: DC

## 2016-09-29 NOTE — Assessment & Plan Note (Signed)
He admits Flonase helps if he uses it consistently. We can try adding an antihistamine by giving sample Dymista. If hoarseness persists then he may need ENT visualization of vocal cords.

## 2016-09-29 NOTE — Progress Notes (Signed)
Patient seen in the office today and instructed on use of Anoro.  Patient expressed understanding and demonstrated technique. Thomas LoronCherina [name] [name] CMA 02.07.18

## 2016-09-29 NOTE — Patient Instructions (Addendum)
Order- Schedule PFT    [facility]      Dx COPD mixed type  Order- CXR  Dx COPD mixed type  Sample Anoro Ellipta inhaler     Inhale 1 puff, once daily.    Try this instead of Dulera. [facility] runs [facility]  Sample, script and coupon for Dymista nasal spray    1-2 puffs each nostril once daily at bedtime. Try this instead of Flonase for comparison.   Please call if we can help

## 2016-09-29 NOTE — Assessment & Plan Note (Signed)
History of GERD but he says symptoms are well controlled now on medication so it seems less likely this would be causing his persistent hoarseness.

## 2016-09-29 NOTE — Progress Notes (Signed)
Patient ID: Thomas BattlesHerman L Alberg, male    DOB: Nov 06, 1940, 76 y.o.   MRN: [id number]  HPI  male former smoker followed for allergic rhinitis, asthma, complicated by colitis, HBP, GERD, CAD/ASVD/CABG Office Spirometry 09/29/2015-moderately severe obstruction. FVC 2.63/67%, FEV1 1.63/54%, FEV1/FVC 0.62,  -----------------------------------------------------------------------------  09/29/2015-[age] year old male former smoker followed for allergic rhinitis, asthma, complicated by colitis, HBP, GERD, CAD/ASVD/CABG FOLLOWS FOR: Pt states he had slight wheezing and touch of possible bronchitis recently -took Prednisone rx to get through that. Otherwise doing well. Wheezing more in the past week and has been taking prednisone 5 mg daily since this started. Scant clear mucus. Using Dulera 1 puff twice daily and occasional rescue inhaler. Office Spirometry 09/29/2015-moderately severe obstruction. FVC 2.63/67%, FEV1 1.63/54%, FEV1/FVC 0.62,  09/29/[birth year]16[birth year]76 year old male  former smoker followed for allergic rhinitis, asthma/ COPD, complicated by colitis, HBP, GERD, CAD/ASVD/CABG 1 yr follow up for asthma. Denies any breathing problems.  Voices been raspy for the past month blamed on postnasal drip. Flonase has helped before but not used currently. No headache or purulent discharge. Shortness of breath and cough fairly well controlled. Uses rescue inhaler once a day, most days, nebulizer infrequently. Prednisone 5 mg daily only occasionally for exacerbation. Continues Dulera.  Review of Systems- see HPI Constitutional:   No-   weight loss, night sweats, fevers, chills, fatigue, +lassitude. HEENT:   No-  headaches, difficulty swallowing, tooth/dental problems, sore throat,       No-  sneezing, itching, ear ache,   +nasal congestion, post nasal drip,  CV:  , no-orthopnea, PND, swelling in lower extremities, anasarca, dizziness, palpitations Resp: +shortness of breath with exertion or at rest.  + productive  cough              No-  coughing up of blood.               change in color of mucus.  +Some wheezing.   Skin: No-   rash or lesions. GI:  No- acute  heartburn, indigestion, abdominal pain, nausea, vomiting,  GU: MS:  No-   joint pain or swelling.    + back pain. Neuro- no-peripheral paresthesias  Psych:  No- change in mood or affect. No depression or anxiety.  No memory loss.   Objective:   Physical Exam General- Alert, Oriented, Affect-appropriate, Distress- none acute   Skin- rash-none, lesions- none, excoriation- none.  Lymphadenopathy- none Head- atraumatic            Eyes- Gross vision intact, PERRLA, conjunctivae clear secretions  Chronic squint right eye            Ears- Hearing, canals normal            Nose- Clear,No- Septal dev, mucus, polyps, erosion, perforation             Throat- Mallampati III-IV , mucosa clear , drainage- none, tonsils- atrophic, + dentures, + raspy voice Neck- flexible , trachea midline, no stridor , thyroid nl, carotid no bruit Chest - symmetrical excursion , unlabored           Heart/CV- RRR , no murmur , no gallop  , no rub, nl s1 s2                           - JVD- none , edema- none, stasis changes- none, varices- none           Lung- + quiet unlabored, dullness-none, rub- none, not coughing  Chest wall-  Abd- Br/ Gen/ Rectal- Not done, not indicated Extrem- cyanosis- none, clubbing, none, atrophy- none, strength- nl Neuro- grossly intact to observation

## 2016-09-29 NOTE — Assessment & Plan Note (Signed)
Likely more appropriate to consider his obstructive airways disease to be mixed COPD rather than asthma. Plan-full PFT, try Anora Ellipta inhaler

## 2016-10-01 ENCOUNTER — Telehealth: Payer: Self-pay | Admitting: Internal Medicine

## 2016-10-01 NOTE — Telephone Encounter (Signed)
Called and spoke with the pt and he stated that he went to pick up his inhaler and that the ventolin was given to him.  He stated that he prefers to have the proair.  I called the pharmacy and they will change this so that his next refill is the proair.  Nothing further is needed.

## 2016-10-04 ENCOUNTER — Ambulatory Visit (INDEPENDENT_AMBULATORY_CARE_PROVIDER_SITE_OTHER): Payer: Medicare Other | Admitting: Internal Medicine

## 2016-10-04 DIAGNOSIS — J449 Chronic obstructive pulmonary disease, unspecified: Secondary | ICD-10-CM | POA: Diagnosis not present

## 2016-10-04 LAB — PULMONARY FUNCTION TEST
DL/VA % pred: 79 %
DL/VA: 3.56 ml/min/mmHg/L
DLCO COR % PRED: 65 %
DLCO COR: 19.55 ml/min/mmHg
DLCO unc % pred: 65 %
DLCO unc: 19.38 ml/min/mmHg
FEF 25-75 POST: 1.76 L/s
FEF 25-75 PRE: 1.33 L/s
FEF2575-%Change-Post: 32 %
FEF2575-%PRED-PRE: 68 %
FEF2575-%Pred-Post: 90 %
FEV1-%Change-Post: 6 %
FEV1-%Pred-Post: 91 %
FEV1-%Pred-Pre: 85 %
FEV1-Post: 2.5 L
FEV1-Pre: 2.35 L
FEV1FVC-%Change-Post: 3 %
FEV1FVC-%PRED-PRE: 96 %
FEV6-%CHANGE-POST: 4 %
FEV6-%PRED-POST: 96 %
FEV6-%Pred-Pre: 91 %
FEV6-POST: 3.43 L
FEV6-Pre: 3.28 L
FEV6FVC-%Change-Post: 1 %
FEV6FVC-%PRED-POST: 106 %
FEV6FVC-%Pred-Pre: 104 %
FVC-%CHANGE-POST: 2 %
FVC-%PRED-POST: 90 %
FVC-%Pred-Pre: 88 %
FVC-PRE: 3.38 L
FVC-Post: 3.47 L
PRE FEV1/FVC RATIO: 70 %
Post FEV1/FVC ratio: 72 %
Post FEV6/FVC ratio: 99 %
Pre FEV6/FVC Ratio: 97 %

## 2016-10-04 NOTE — Progress Notes (Signed)
PFT performed today. 

## 2016-10-26 ENCOUNTER — Telehealth: Payer: Self-pay | Admitting: Internal Medicine

## 2016-10-26 MED ORDER — MOMETASONE FURO-FORMOTEROL FUM 200-5 MCG/ACT IN AERO: INHALATION_SPRAY | RESPIRATORY_TRACT | 11 refills | Status: DC

## 2016-10-26 NOTE — Telephone Encounter (Signed)
Spoke with pt's spouse and notified rx was sent  Nothing further needed

## 2016-10-28 ENCOUNTER — Telehealth: Payer: Self-pay | Admitting: Internal Medicine

## 2016-10-28 NOTE — Telephone Encounter (Signed)
Called and spoke to pt. Pt states he was informed that the [facility]ul[facility] needs a PA. Pt states he tried a sample of the Anoro and states he feels the [facility] worked better.   Dr. Maple HudsonYoung please advise if you would like to move forward with PA or have pt find what covered alternatives are. Thanks.   Allergies  Allergen Reactions  . Aspirin Hives  . Codeine Hives  . Fluticasone-Salmeterol Hives  . Montelukast Sodium Hives  . Adhesive [Tape] Rash  . Cephalexin Rash  . Ibuprofen Rash  . Latex Rash  . Penicillins Rash  . Statins Rash  . Sulfonamide Derivatives Rash    Current Outpatient Prescriptions on File Prior to Visit  Medication Sig Dispense Refill  . albuterol (PROAIR HFA) 108 (90 Base) MCG/ACT inhaler INHALE 2 PUFFS INTO THE LUNGS EVERY 6 HOURS AS NEEDED FOR WHEEZING OR SHORTNESS OF BREATH 18 g 12  . albuterol (PROVENTIL) (2.5 MG/3ML) 0.083% nebulizer solution Take 3 mLs (2.5 mg total) by nebulization every 4 (four) hours as needed for wheezing or shortness of breath. 150 mL 12  . aspirin EC 81 MG tablet Take 81 mg by mouth daily.    . Azelastine-Fluticasone (DYMISTA) 137-50 MCG/ACT SUSP Place 1-2 puffs into the nose at bedtime. 1 Bottle prn  . fexofenadine (ALLEGRA) 180 MG tablet Take 180 mg by mouth daily.      Marland Kitchen. lisinopril (PRINIVIL,ZESTRIL) 10 MG tablet Take 10 mg by mouth every morning.     . mometasone-formoterol (DULERA) 200-5 MCG/ACT AERO Inhale 2 puffs then rinse mouth, twice daily 1 Inhaler 11  . predniSONE (DELTASONE) 5 MG tablet Take 1 tablet (5 mg total) by mouth daily as needed (asthma). 30 tablet 5  . umeclidinium-vilanterol (ANORO ELLIPTA) 62.5-25 MCG/INH AEPB Inhale 1 puff into the lungs daily. 1 each 0   No current facility-administered medications on file prior to visit.

## 2016-10-29 NOTE — Telephone Encounter (Signed)
Per chart pt has only been on Dulera 100 and Anoro. atc insurance company to check on covered alternatives, was on hold 15 minutes with no answer/response from insurance.  lmtcb X1 to see if he has formulary at home.

## 2016-10-29 NOTE — Telephone Encounter (Signed)
We should always try first to determine covered alternatives please, and which ones he may have tried unsuccessfully

## 2016-11-02 NOTE — Telephone Encounter (Signed)
Patient stated pharmacy did not receive prior Auth. He is completely out of the medication Dulera.

## 2016-11-02 NOTE — Telephone Encounter (Signed)
Called spoke with patient who reported he called his insurance and there are 3 covered alternatives: Advair diskus, Anoro, Bevespi. His ProAir is covered.  Pt was given sample of Anoro at the last ov and stated he is willing to try this. Please advise, thank you.  **ok to go ahead and send Rx per pt and leave detailed message with recommendations.  K[facility] [employer] [facility] [employer].

## 2016-11-02 NOTE — Telephone Encounter (Signed)
Patient returning call - he can be reached at [phone] -pr

## 2016-11-02 NOTE — Telephone Encounter (Signed)
Spoke with pt. States that he will look for his drug formulary and call us back. Will await call back.

## 2016-11-03 MED ORDER — ALBUTEROL SULFATE HFA 108 (90 BASE) MCG/ACT IN AERS: INHALATION_SPRAY | RESPIRATORY_TRACT | 1 refills | Status: DC

## 2016-11-03 MED ORDER — GLYCOPYRROLATE-FORMOTEROL 9-4.8 MCG/ACT IN AERO: 2.0000 | INHALATION_SPRAY | Freq: Two times a day (BID) | RESPIRATORY_TRACT | 11 refills | Status: DC

## 2016-11-03 NOTE — Telephone Encounter (Signed)
lmtcb x1 for pt. 

## 2016-11-03 NOTE — Telephone Encounter (Signed)
Spoke with the pt and notified of recs per CDY  He verbalized understanding  No samples of Bevespi are available  Rx was sent

## 2016-11-03 NOTE — Telephone Encounter (Signed)
Spoke with the pt and notified of recs per CDY  He states he has a rash on his back that he did not have prior to starting on the Anorno He is afraid that Anoro is causing rash and is requesting another alternative  Please advise, thanks

## 2016-11-03 NOTE — Telephone Encounter (Signed)
Ok to Rx Anoro, either 3 for 3 months with 3 refills, or 1 with 12 refills         Inhale 1 puff, once daily maintenance

## 2016-11-03 NOTE — Telephone Encounter (Signed)
Ok to change to [employer], # 1, inhale 2 puffs, twice daily    Ref x 12     He can try a sample if we have one.  I would be surprised if rash were from Anoro. If it persists he should get it looked at.

## 2017-01-27 ENCOUNTER — Ambulatory Visit: Payer: Medicare Other | Admitting: Internal Medicine

## 2017-05-02 ENCOUNTER — Ambulatory Visit: Payer: Medicare Other | Admitting: Internal Medicine

## 2017-09-09 ENCOUNTER — Other Ambulatory Visit: Payer: Self-pay | Admitting: Internal Medicine

## 2017-09-09 DIAGNOSIS — Z8679 Personal history of other diseases of the circulatory system: Secondary | ICD-10-CM

## 2017-09-15 ENCOUNTER — Other Ambulatory Visit: Payer: Medicare Other

## 2017-09-15 ENCOUNTER — Ambulatory Visit
Admission: RE | Admit: 2017-09-15 | Discharge: 2017-09-15 | Disposition: A | Discharge status: Home / Self Care | Payer: Medicare Other | Source: Ambulatory Visit | Attending: Internal Medicine | Admitting: Internal Medicine

## 2017-09-15 DIAGNOSIS — Z8679 Personal history of other diseases of the circulatory system: Secondary | ICD-10-CM

## 2017-10-20 ENCOUNTER — Other Ambulatory Visit: Payer: Self-pay | Admitting: Internal Medicine

## 2017-12-26 ENCOUNTER — Emergency Department (HOSPITAL_COMMUNITY)
Admission: EM | Admit: 2017-12-26 | Discharge: 2017-12-26 | Disposition: A | Discharge status: Home / Self Care | Payer: Medicare Other | Attending: Emergency Medicine | Admitting: Emergency Medicine

## 2017-12-26 ENCOUNTER — Encounter (HOSPITAL_COMMUNITY): Payer: Self-pay

## 2017-12-26 DIAGNOSIS — Y939 Activity, unspecified: Secondary | ICD-10-CM | POA: Insufficient documentation

## 2017-12-26 DIAGNOSIS — Y929 Unspecified place or not applicable: Secondary | ICD-10-CM | POA: Diagnosis not present

## 2017-12-26 DIAGNOSIS — I119 Hypertensive heart disease without heart failure: Secondary | ICD-10-CM | POA: Diagnosis not present

## 2017-12-26 DIAGNOSIS — Z79899 Other long term (current) drug therapy: Secondary | ICD-10-CM | POA: Diagnosis not present

## 2017-12-26 DIAGNOSIS — Z7982 Long term (current) use of aspirin: Secondary | ICD-10-CM | POA: Insufficient documentation

## 2017-12-26 DIAGNOSIS — I2510 Atherosclerotic heart disease of native coronary artery without angina pectoris: Secondary | ICD-10-CM | POA: Insufficient documentation

## 2017-12-26 DIAGNOSIS — Z951 Presence of aortocoronary bypass graft: Secondary | ICD-10-CM | POA: Insufficient documentation

## 2017-12-26 DIAGNOSIS — Z87891 Personal history of nicotine dependence: Secondary | ICD-10-CM | POA: Diagnosis not present

## 2017-12-26 DIAGNOSIS — Z91040 Latex allergy status: Secondary | ICD-10-CM | POA: Diagnosis not present

## 2017-12-26 DIAGNOSIS — J449 Chronic obstructive pulmonary disease, unspecified: Secondary | ICD-10-CM | POA: Insufficient documentation

## 2017-12-26 DIAGNOSIS — Y999 Unspecified external cause status: Secondary | ICD-10-CM | POA: Insufficient documentation

## 2017-12-26 DIAGNOSIS — S50361A Insect bite (nonvenomous) of right elbow, initial encounter: Secondary | ICD-10-CM | POA: Diagnosis present

## 2017-12-26 DIAGNOSIS — W57XXXA Bitten or stung by nonvenomous insect and other nonvenomous arthropods, initial encounter: Secondary | ICD-10-CM | POA: Insufficient documentation

## 2017-12-26 MED ORDER — DOXYCYCLINE HYCLATE 100 MG PO CAPS: 100.0000 mg | ORAL_CAPSULE | Freq: Two times a day (BID) | ORAL | 0 refills | Status: DC

## 2017-12-26 NOTE — ED Triage Notes (Signed)
Pt states he is not sure if he was bitten on his right elbow or not. Has an area of circular swelling to right elbow that is sore. Pt states it does not itch at all.

## 2017-12-26 NOTE — Discharge Instructions (Signed)
Return if any problems.  Return here for recheck or see Dr. Valentina Lucks tomorrow for evaluation

## 2017-12-26 NOTE — ED Provider Notes (Signed)
[facility] EMERGENCY DEPARTMENT Provider Note   CSN: [id number] Arrival date & time: 12/26/17  1503     History   Chief Complaint Chief Complaint  Patient presents with  . Insect Bite    HPI Thomas [name] is a 77 y.o. male.  The history is provided by the patient. No language interpreter was used.  Arm Injury   This is a new problem. The problem occurs constantly. The pain is present in the right elbow. The pain is mild. Pertinent negatives include full range of motion. He has tried nothing for the symptoms. There has been no history of extremity trauma.  Pt thinks he may have been bitten by something.  Pt complains of swelling to his right elbow.    Past Medical History:  Diagnosis Date  . AAA (abdominal aortic aneurysm) (HCC)    4.2 cm by CTA 10/01/13- states "much smaller than first expected"Dr. Valentina Lucks follows-checks every 2 yrs  . Allergic asthma   . Allergic rhinitis   . Arthritis    torn rotator cuff left shoulder  . Bruise    left chest area"recent fall"-no rib fracture identified.  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 Colitis   . GERD (gastroesophageal reflux disease)   . HLD (hyperlipidemia)   . HTN (hypertension)   . Obesity   . Polio    left leg  . Pupil irregularity, right    old injury"pupil will not contract to light"  . Shortness of breath    exertion, asthma attack    Patient Active Problem List   Diagnosis Date Noted  . Nocturnal leg cramps 09/27/2014  . S/P lumbar microdiscectomy 11/07/2013  . Lumbar disc disease 10/29/2013  . CAD (coronary artery disease)   . Obesity (BMI 30-39.9)   . Pupil diameter unequal   . Hyperlipidemia   . Hypertensive heart disease   . 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 Esophageal reflux 01/01/2008  . Seasonal and perennial allergic rhinitis     Past Surgical History:  Procedure Laterality Date  . CARDIAC CATHETERIZATION     2006- 1 week prior tp CABPG  . CATARACT EXTRACTION    .  COLONOSCOPY WITH PROPOFOL N/A 04/27/2016   Procedure: COLONOSCOPY WITH PROPOFOL;  Surgeon: Charolett Bumpers, MD;  Location: [facility];  Service: Endoscopy;  Laterality: N/A;  . CORONARY ARTERY BYPASS GRAFT  3/06   x4 vessel bypass [facility]-No problems since.  Marland Kitchen EYE SURGERY     rebuild eye socket (R)  . KNEE ARTHROSCOPY Left    left knee scope  . LUMBAR DISC SURGERY  2011  . LUMBAR LAMINECTOMY/DECOMPRESSION MICRODISCECTOMY Left 11/07/2013   Procedure: LUMBAR LAMINECTOMY/DECOMPRESSION MICRODISCECTOMY 1 LEVEL lumbar two/three;  Surgeon: Tia Alert, MD;  Location: [facility];  Service: Neurosurgery;  Laterality: Left;  . NASAL SINUS SURGERY          Home Medications    Prior to Admission medications   Medication Sig Start Date End Date Taking? Authorizing Provider  albuterol (PROAIR HFA) 108 (90 Base) MCG/ACT inhaler INHALE 2 PUFFS INTO THE LUNGS EVERY 6 HOURS IF NEEDED FOR WHEEZING OR SHORTNESS OF BREATH 10/20/17   Jetty Duhamel D, MD  albuterol (PROVENTIL) (2.5 MG/3ML) 0.083% nebulizer solution Take 3 mLs (2.5 mg total) by nebulization every 4 (four) hours as needed for wheezing or shortness of breath. 10/07/14   Jetty Duhamel D, MD  aspirin EC 81 MG tablet Take 81 mg by mouth  daily.    [provider]  Azelastine-Fluticasone (DYMISTA) 137-50 MCG/ACT SUSP Place 1-2 puffs into the nose at bedtime. 09/29/16 09/29/17  Jetty Duhamel D, MD  fexofenadine (ALLEGRA) 180 MG tablet Take 180 mg by mouth daily.      [provider]  Glycopyrrolate-Formoterol (BEVESPI AEROSPHERE) 9-4.8 MCG/ACT AERO Inhale 2 puffs into the lungs 2 (two) times daily. 11/03/16   Jetty Duhamel D, MD  lisinopril (PRINIVIL,ZESTRIL) 10 MG tablet Take 10 mg by mouth every morning.     [provider]  predniSONE (DELTASONE) 5 MG tablet Take 1 tablet (5 mg total) by mouth daily as needed (asthma). 12/10/14   Waymon Budge, MD    Family History Family History  Problem Relation Age of Onset  .  Alzheimer's disease Mother   . Liver disease Sister   . Lung cancer Brother     Social History Social History   Tobacco Use  . Smoking status: Former Smoker    Packs/day: 1.00    Years: 37.00    Pack years: 37.00    Types: Cigarettes    Last attempt to quit: 08/23/1992    Years since quitting: 25.3  . Smokeless tobacco: Never Used  Substance Use Topics  . Alcohol use: Not Currently    Frequency: Never    Comment: socially   . Drug use: No     Allergies   Aspirin; Codeine; Fluticasone-salmeterol; Montelukast sodium; Adhesive [tape]; Cephalexin; Ibuprofen; Latex; Penicillins; Statins; and Sulfonamide derivatives   Review of Systems Review of Systems  All other systems reviewed and are negative.    Physical Exam Updated Vital Signs BP (!) 148/71 (BP Location: Right Arm)   Pulse 76   Temp 97.9 F (36.6 C) (Oral)   Resp 15   Ht  (1.727 m)   Wt 91.2 kg (201 lb)   SpO2 95%   BMI 30.56 kg/m   Physical Exam  Constitutional: He appears well-developed and well-nourished.  HENT:  Head: Normocephalic.  Cardiovascular: Normal rate.  Pulmonary/Chest: Effort normal.  Musculoskeletal: He exhibits tenderness.  10cm red area around elbow,  From  nv and ns intact,  No sign of abscess, no fluctuation   Neurological: He is alert.  Skin: Skin is warm. There is erythema.  Psychiatric: He has a normal mood and affect.  Nursing note and vitals reviewed.    ED Treatments / Results  Labs (all labs ordered are listed, but only abnormal results are displayed) Labs Reviewed - No data to display  EKG None  Radiology No results found.  Procedures Procedures (including critical care time)  Medications Ordered in ED Medications - No data to display   Initial Impression / Assessment and Plan / ED Course  I have reviewed the triage vital signs and the nursing notes.  Pertinent labs & imaging results that were available during my care of the patient were reviewed by  me and considered in my medical decision making (see chart for details).  Clinical Course as of Dec 27 1555  Mon Dec 26, 2017  6428 77 year old male here with acute onset of right elbow swelling redness and pain.  He noticed that yesterday, seems the same today.  Not associated with any systemic illness.  There is some warmth redness over the entire elbow with a little bit of induration about a centimeter with a possible skin break that might of been a bug bite.  We will start him on some antibiotics and follow-up with his PCP tomorrow.   [  MB]    Clinical Course User Index [MB] Terrilee Files, MD    Possible insect bite with local erythema vs possible early infection.   I will cover pt with Doxycycline.   Area marked.   Pt advised to see Dr. Valentina Lucks or recheck here tomorrow.   Final Clinical Impressions(s) / ED Diagnoses   Final diagnoses:  Insect bite of right elbow, initial encounter    ED Discharge Orders        Ordered    doxycycline (VIBRAMYCIN) 100 MG capsule  2 times daily     12/26/17 1602    An After Visit Summary was printed and given to the patient.    Elson Areas, [place] 12/26/17 1603    Terrilee Files, MD 12/27/17 [phone]

## 2018-02-10 ENCOUNTER — Encounter (HOSPITAL_COMMUNITY): Payer: Self-pay | Admitting: Emergency Medicine

## 2018-02-10 ENCOUNTER — Other Ambulatory Visit: Payer: Self-pay

## 2018-02-10 ENCOUNTER — Emergency Department (HOSPITAL_COMMUNITY)
Admission: EM | Admit: 2018-02-10 | Discharge: 2018-02-10 | Disposition: A | Discharge status: Home / Self Care | Payer: Medicare Other | Attending: Emergency Medicine | Admitting: Emergency Medicine

## 2018-02-10 DIAGNOSIS — Z79899 Other long term (current) drug therapy: Secondary | ICD-10-CM | POA: Diagnosis not present

## 2018-02-10 DIAGNOSIS — W25XXXA Contact with sharp glass, initial encounter: Secondary | ICD-10-CM | POA: Diagnosis not present

## 2018-02-10 DIAGNOSIS — Z23 Encounter for immunization: Secondary | ICD-10-CM | POA: Insufficient documentation

## 2018-02-10 DIAGNOSIS — Z87891 Personal history of nicotine dependence: Secondary | ICD-10-CM | POA: Diagnosis not present

## 2018-02-10 DIAGNOSIS — I119 Hypertensive heart disease without heart failure: Secondary | ICD-10-CM | POA: Diagnosis not present

## 2018-02-10 DIAGNOSIS — Z7982 Long term (current) use of aspirin: Secondary | ICD-10-CM | POA: Insufficient documentation

## 2018-02-10 DIAGNOSIS — Y92018 Other place in single-family (private) house as the place of occurrence of the external cause: Secondary | ICD-10-CM | POA: Diagnosis not present

## 2018-02-10 DIAGNOSIS — S41112A Laceration without foreign body of left upper arm, initial encounter: Secondary | ICD-10-CM

## 2018-02-10 DIAGNOSIS — Y999 Unspecified external cause status: Secondary | ICD-10-CM | POA: Insufficient documentation

## 2018-02-10 DIAGNOSIS — Y93H9 Activity, other involving exterior property and land maintenance, building and construction: Secondary | ICD-10-CM | POA: Insufficient documentation

## 2018-02-10 DIAGNOSIS — I259 Chronic ischemic heart disease, unspecified: Secondary | ICD-10-CM | POA: Insufficient documentation

## 2018-02-10 MED ORDER — TETANUS-DIPHTH-ACELL PERTUSSIS 5-2.5-18.5 LF-MCG/0.5 IM SUSP
0.5000 mL | Freq: Once | INTRAMUSCULAR | Status: AC
  Administered 2018-02-10: 0.5 mL via INTRAMUSCULAR
  Filled 2018-02-10: qty 0.5

## 2018-02-10 MED ORDER — BACITRACIN-NEOMYCIN-POLYMYXIN 400-5-5000 EX OINT
TOPICAL_OINTMENT | Freq: Once | CUTANEOUS | Status: AC
  Administered 2018-02-10: 14:00:00 via TOPICAL
  Filled 2018-02-10: qty 2

## 2018-02-10 NOTE — ED Provider Notes (Signed)
[facility] EMERGENCY DEPARTMENT Provider Note   CSN: [id number] Arrival date & time: 02/10/18  1228     History   Chief Complaint Chief Complaint  Patient presents with  . Laceration    HPI Thomas [name] is a 77 y.o. male  He reports that on Wednesday he was changing a storm window when it fell on him and broke cutting his left arm.  He reports his last tetanus shot was around 2014, however is unsure of exactly when.  He denies any fevers or chills.  He has been keeping the area clean and dry.  No numbness or tingling to remainder of left arm.    HPI  Past Medical History:  Diagnosis Date  . AAA (abdominal aortic aneurysm) (HCC)    4.2 cm by CTA 10/01/13- states "much smaller than first expected"Dr. Valentina LucksGriffin follows-checks every 2 yrs  . Allergic asthma   . Allergic rhinitis   . Arthritis    torn rotator cuff left shoulder  . Bruise    left chest area"recent fall"-no rib fracture identified.  Marland Kitchen. 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  . Colitis   . GERD (gastroesophageal reflux disease)   . HLD (hyperlipidemia)   . HTN (hypertension)   . Obesity   . Polio    left leg  . Pupil irregularity, right    old injury"pupil will not contract to light"  . Shortness of breath    exertion, asthma attack    Patient Active Problem List   Diagnosis Date Noted  . Nocturnal leg cramps 09/27/2014  . S/P lumbar microdiscectomy 11/07/2013  . Lumbar disc disease 10/29/2013  . CAD (coronary artery disease)   . Obesity (BMI 30-39.9)   . Pupil diameter unequal   . Hyperlipidemia   . Hypertensive heart disease   . 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 Esophageal reflux 01/01/2008  . Seasonal and perennial allergic rhinitis     Past Surgical History:  Procedure Laterality Date  . CARDIAC CATHETERIZATION     2006- 1 week prior tp CABPG  . CATARACT EXTRACTION    . COLONOSCOPY WITH PROPOFOL N/A 04/27/2016   Procedure: COLONOSCOPY WITH PROPOFOL;  Surgeon: Charolett BumpersMartin K Johnson,  MD;  Location: [facility];  Service: Endoscopy;  Laterality: N/A;  . CORONARY ARTERY BYPASS GRAFT  3/06   x4 vessel bypass [facility]-No problems since.  Marland Kitchen. EYE SURGERY     rebuild eye socket (R)  . KNEE ARTHROSCOPY Left    left knee scope  . LUMBAR DISC SURGERY  2011  . LUMBAR LAMINECTOMY/DECOMPRESSION MICRODISCECTOMY Left 11/07/2013   Procedure: LUMBAR LAMINECTOMY/DECOMPRESSION MICRODISCECTOMY 1 LEVEL lumbar two/three;  Surgeon: Tia Alertavid S Jones, MD;  Location: [facility];  Service: Neurosurgery;  Laterality: Left;  . NASAL SINUS SURGERY          Home Medications    Prior to Admission medications   Medication Sig Start Date End Date Taking? Authorizing Provider  albuterol (PROAIR HFA) 108 (90 Base) MCG/ACT inhaler INHALE 2 PUFFS INTO THE LUNGS EVERY 6 HOURS IF NEEDED FOR WHEEZING OR SHORTNESS OF BREATH 10/20/17   Jetty DuhamelYoung, Clinton D, MD  albuterol (PROVENTIL) (2.5 MG/3ML) 0.083% nebulizer solution Take 3 mLs (2.5 mg total) by nebulization every 4 (four) hours as needed for wheezing or shortness of breath. 10/07/14   Jetty DuhamelYoung, Clinton D, MD  aspirin EC 81 MG tablet Take 81 mg by mouth daily.    [provider]  Azelastine-Fluticasone (DYMISTA) 137-50 MCG/ACT SUSP  Place 1-2 puffs into the nose at bedtime. 09/29/16 09/29/17  Jetty Duhamel D, MD  doxycycline (VIBRAMYCIN) 100 MG capsule Take 1 capsule (100 mg total) by mouth 2 (two) times daily. 12/26/17   Elson Areas, PA-C  fexofenadine (ALLEGRA) 180 MG tablet Take 180 mg by mouth daily.      [provider]  Glycopyrrolate-Formoterol (BEVESPI AEROSPHERE) 9-4.8 MCG/ACT AERO Inhale 2 puffs into the lungs 2 (two) times daily. 11/03/16   Jetty Duhamel D, MD  lisinopril (PRINIVIL,ZESTRIL) 10 MG tablet Take 10 mg by mouth every morning.     [provider]  predniSONE (DELTASONE) 5 MG tablet Take 1 tablet (5 mg total) by mouth daily as needed (asthma). 12/10/14   Waymon Budge, MD    Family History Family History  Problem  Relation Age of Onset  . Alzheimer's disease Mother   . Liver disease Sister   . Lung cancer Brother     Social History Social History   Tobacco Use  . Smoking status: Former Smoker    Packs/day: 1.00    Years: 37.00    Pack years: 37.00    Types: Cigarettes    Last attempt to quit: 08/23/1992    Years since quitting: 25.4  . Smokeless tobacco: Never Used  Substance Use Topics  . Alcohol use: Not Currently    Frequency: Never    Comment: socially   . Drug use: No     Allergies   Aspirin; Codeine; Fluticasone-salmeterol; Montelukast sodium; Adhesive [tape]; Cephalexin; Ibuprofen; Latex; Penicillins; Statins; and Sulfonamide derivatives   Review of Systems Review of Systems Review of Systems  Constitutional: Negative for chills and fever.  Skin: Positive for wound. Negative for pallor and rash.  Neurological: Negative for weakness and numbness.    Physical Exam Updated Vital Signs BP (!) 170/70 (BP Location: Right Arm)   Pulse 60   Temp 98.7 F (37.1 C) (Oral)   Resp 16   Ht 5\' 8"  (1.727 m)   Wt 88.9 kg (196 lb)   SpO2 97%   BMI 29.80 kg/m   Physical Exam Physical Exam  Constitutional: He appears well-developed and well-nourished.  HENT:  Head: Normocephalic and atraumatic.  Cardiovascular: Intact distal pulses.  Left hand is warm and well perfused, 2+ radial pulse.  Musculoskeletal: Normal range of motion.  5/5 strength in left arm.  Neurological: He is alert. No sensory deficit (Sensation intact to left hand/arm.).  Skin: He is not diaphoretic.  There is a 3 cm laceration that is slightly oozing blood on the posterior lateral left distal upper arm.  There is a skin tear just below this, that is approximately 1-1/2 cm.  There is a skin tear/abrasion to the left forearm.  No obvious abnormal erythema, induration, fluctuance, or swelling.    Nursing note and vitals reviewed.    ED Treatments / Results  Labs (all labs ordered are listed, but only  abnormal results are displayed) Labs Reviewed - No data to display  EKG None  Radiology No results found.  Procedures Procedures (including critical care time)  Medications Ordered in ED Medications  neomycin-bacitracin-polymyxin (NEOSPORIN) ointment (has no administration in time range)  Tdap (BOOSTRIX) injection 0.5 mL (0.5 mLs Intramuscular Given 02/10/18 1350)     Initial Impression / Assessment and Plan / ED Course  I have reviewed the triage vital signs and the nursing notes.  Pertinent labs & imaging results that were available during my care of the patient were reviewed by me  and considered in my medical decision making (see chart for details).    Patient presents today for evaluation of a laceration that occurred approximately 48 hours ago.  Wound is not obviously infected at this time.  Mild oozing from the laceration.  Due to delayed time from injury to presentation will not close wound secondary to infection risk.  Tdap updated.  Patient given instructions on generalized wound care.  Discharged home with close outpatient follow-up.  Return precautions were discussed and patient states understanding.  This patient was seen as a shared visit with Dr. Rubin Payor who evaluated the patient and agreed with my plan.   Final Clinical Impressions(s) / ED Diagnoses   Final diagnoses:  Laceration of left upper extremity, initial encounter    ED Discharge Orders    None       Norman Clay 02/10/18 1405    Benjiman Core, MD 02/10/18 1511

## 2018-02-10 NOTE — Discharge Instructions (Signed)
Please monitor your wounds.  If you develop redness, increased pain, swelling, or drainage of thick pus like material then please seek additional medical care.  Please follow-up with your doctor early next week for repeat evaluation.

## 2018-02-10 NOTE — ED Triage Notes (Signed)
Cut to elbow with glass on Weds night  Last DT 5 years ago  Per wife, doesn't look like it is getting better

## 2018-02-10 NOTE — ED Notes (Signed)
EH in to assess 

## 2018-03-13 ENCOUNTER — Other Ambulatory Visit: Payer: Self-pay | Admitting: Internal Medicine

## 2018-03-13 DIAGNOSIS — I714 Abdominal aortic aneurysm, without rupture, unspecified: Secondary | ICD-10-CM

## 2018-03-23 ENCOUNTER — Ambulatory Visit
Admission: RE | Admit: 2018-03-23 | Discharge: 2018-03-23 | Disposition: A | Discharge status: Home / Self Care | Payer: Medicare Other | Source: Ambulatory Visit | Attending: Internal Medicine | Admitting: Internal Medicine

## 2018-03-23 DIAGNOSIS — I714 Abdominal aortic aneurysm, without rupture, unspecified: Secondary | ICD-10-CM

## 2018-03-23 MED ORDER — IOPAMIDOL (ISOVUE-370) INJECTION 76%
75.0000 mL | Freq: Once | INTRAVENOUS | Status: AC | PRN
  Administered 2018-03-23: 75 mL via INTRAVENOUS

## 2018-04-10 ENCOUNTER — Ambulatory Visit (INDEPENDENT_AMBULATORY_CARE_PROVIDER_SITE_OTHER): Payer: Medicare Other | Admitting: Vascular Surgery

## 2018-04-10 ENCOUNTER — Encounter: Payer: Self-pay | Admitting: Vascular Surgery

## 2018-04-10 VITALS — BP 158/83 | HR 61 | Temp 97.7°F | Resp 20 | Ht 68.0 in | Wt 197.0 lb

## 2018-04-10 DIAGNOSIS — I714 Abdominal aortic aneurysm, without rupture, unspecified: Secondary | ICD-10-CM

## 2018-04-10 NOTE — Progress Notes (Signed)
[facility] [place]  Patient name: Renato BattlesHerman L Engelhard MRN: [id number] DOB: 1940/09/23 Sex: male  REASON FOR CONSULT: Evaluation of abdominal aortic aneurysm  HPI: Renato BattlesHerman L Ransom is a 77 y.o. male, who is here today for discussion of abdominal aortic aneurysm.  This is been known about for approximately 4 years.  He had a recent CT scan showing maximal diameter of 4.8 cm.  He has no symptoms referable to this.  Does have a history of prior coronary artery bypass grafting in 2006.  Has done well since that time.  History of peripheral vascular occlusive disease and no history of stroke  Past Medical History:  Diagnosis Date  . AAA (abdominal aortic aneurysm) (HCC)    4.2 cm by CTA 10/01/13- states "much smaller than first expected"Dr. Valentina LucksGriffin follows-checks every 2 yrs  . Allergic asthma   . Allergic rhinitis   . Arthritis    torn rotator cuff left shoulder  . Bruise    left chest area"recent fall"-no rib fracture identified.  Marland Kitchen. 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  . Colitis   . GERD (gastroesophageal reflux disease)   . HLD (hyperlipidemia)   . HTN (hypertension)   . Obesity   . Polio    left leg  . Pupil irregularity, right    old injury"pupil will not contract to light"  . Shortness of breath    exertion, asthma attack    Family History  Problem Relation Age of Onset  . Alzheimer's disease Mother   . Liver disease Sister   . Lung cancer Brother     SOCIAL HISTORY: Social History   Socioeconomic History  . Marital status: Married    Spouse name: Not on file  . Number of children: Not on file  . Years of education: Not on file  . Highest education level: Not on file  Occupational History  . Occupation: Press photographershuttle driver    Employer: [employer]    Comment: [employer]  Social Needs  . Financial resource strain: Not on file  . Food insecurity:    Worry: Not on file    Inability: Not on file  .  Transportation needs:    Medical: Not on file    Non-medical: Not on file  Tobacco Use  . Smoking status: Former Smoker    Packs/day: 1.00    Years: 37.00    Pack years: 37.00    Types: Cigarettes    Last attempt to quit: 08/23/1992    Years since quitting: 25.6  . Smokeless tobacco: Never Used  Substance and Sexual Activity  . Alcohol use: Not Currently    Frequency: Never    Comment: socially   . Drug use: No  . Sexual activity: Not on file  Lifestyle  . Physical activity:    Days per week: Not on file    Minutes per session: Not on file  . Stress: Not on file  Relationships  . Social connections:    Talks on phone: Not on file    Gets together: Not on file    Attends religious service: Not on file    Active member of club or organization: Not on file    Attends meetings of clubs or organizations: Not on file    Relationship status: Not on file  . Intimate partner violence:    Fear of current or ex partner: Not on file    Emotionally abused: Not on file  Physically abused: Not on file    Forced sexual activity: Not on file  Other Topics Concern  . Not on file  Social History Narrative  . Not on file    Allergies  Allergen Reactions  . Aspirin Hives  . Codeine Hives  . Fluticasone-Salmeterol Hives  . Montelukast Sodium Hives  . Adhesive [Tape] Rash  . Cephalexin Rash  . Ibuprofen Rash  . Latex Rash  . Penicillins Rash  . Statins Rash  . Sulfonamide Derivatives Rash    Current Outpatient Medications  Medication Sig Dispense Refill  . albuterol (PROVENTIL) (2.5 MG/3ML) 0.083% nebulizer solution Take 3 mLs (2.5 mg total) by nebulization every 4 (four) hours as needed for wheezing or shortness of breath. 150 mL 12  . aspirin EC 81 MG tablet Take 81 mg by mouth daily.    . fexofenadine (ALLEGRA) 180 MG tablet Take 180 mg by mouth daily.      . Glycopyrrolate-Formoterol (BEVESPI AEROSPHERE) 9-4.8 MCG/ACT AERO Inhale 2 puffs into the lungs 2 (two) times daily.  1 Inhaler 11  . lisinopril (PRINIVIL,ZESTRIL) 10 MG tablet Take 10 mg by mouth every morning.     Marland Kitchen. albuterol (PROAIR HFA) 108 (90 Base) MCG/ACT inhaler INHALE 2 PUFFS INTO THE LUNGS EVERY 6 HOURS IF NEEDED FOR WHEEZING OR SHORTNESS OF BREATH (Patient not taking: Reported on 04/10/2018) 1 Inhaler 0  . Azelastine-Fluticasone (DYMISTA) 137-50 MCG/ACT SUSP Place 1-2 puffs into the nose at bedtime. 1 Bottle prn  . doxycycline (VIBRAMYCIN) 100 MG capsule Take 1 capsule (100 mg total) by mouth 2 (two) times daily. (Patient not taking: Reported on 04/10/2018) 20 capsule 0  . predniSONE (DELTASONE) 5 MG tablet Take 1 tablet (5 mg total) by mouth daily as needed (asthma). 30 tablet 5   No current facility-administered medications for this visit.     REVIEW OF SYSTEMS:  [X]  denotes positive finding, [ ]  denotes negative finding Cardiac  Comments:  Chest pain or chest pressure:    Shortness of breath upon exertion: x   Short of breath when lying flat:    Irregular heart rhythm:        Vascular    Pain in calf, thigh, or hip brought on by ambulation:    Pain in feet at night that wakes you up from your sleep:     Blood clot in your veins:    Leg swelling:         Pulmonary    Oxygen at home:    Productive cough:     Wheezing:  x       Neurologic    Sudden weakness in arms or legs:     Sudden numbness in arms or legs:     Sudden onset of difficulty speaking or slurred speech:    Temporary loss of vision in one eye:     Problems with dizziness:         Gastrointestinal    Blood in stool:     Vomited blood:         Genitourinary    Burning when urinating:     Blood in urine:        Psychiatric    Major depression:         Hematologic    Bleeding problems:    Problems with blood clotting too easily:        Skin    Rashes or ulcers: x       Constitutional    Fever or  chills:      PHYSICAL EXAM: Vitals:   04/10/18 1405 04/10/18 1406  BP: (!) 160/81 (!) 158/83  Pulse: 76 61    Resp: 20   Temp: 97.7 F (36.5 C)   TempSrc: Temporal   Weight: 197 lb (89.4 kg)   Height: 5\' 8"  (1.727 m)     GENERAL: The patient is a well-nourished male, in no acute distress. The vital signs are documented above. CARDIOVASCULAR: Carotid arteries without bruits bilaterally.  2+ radial 2+ femoral and 2+ dorsalis pedis pulses.  No evidence of popliteal artery aneurysms. PULMONARY: There is good air exchange  ABDOMEN: Soft and non-tender.  Moderate obesity.  No aneurysm palpable MUSCULOSKELETAL: There are no major deformities or cyanosis. NEUROLOGIC: No focal weakness or paresthesias are detected. SKIN: There are no ulcers or rashes noted. PSYCHIATRIC: The patient has a normal affect.  DATA:  I reviewed his CT scan from August 2019.  This does show a 4.8 cm infrarenal abdominal aortic aneurysm.  In May 2016 his maximal diameter was 4.5 cm.  MEDICAL ISSUES: I discussed the significance of this with the patient.  I explained that he has had very slow growth over the course of 3 years.  I have recommended ultrasound at 23-month interval to rule out any progression in size.  On CT scan he does have a long infrarenal normal aortic neck and adequate iliac arteries for stent graft repair.  Discussed both the stent graft and open aneurysm repair with the patient should this become necessary if he does have enlargement.  He knows to present immediately to the emergency room should he develop any severe sudden onset of back and abdominal pain.  Will see [place] again with ultrasound in 6 months   Larina Earthly, MD [facility] [place] [facility] Tel [phone] Pager [phone]

## 2018-10-06 ENCOUNTER — Other Ambulatory Visit: Payer: Self-pay

## 2018-10-06 DIAGNOSIS — I714 Abdominal aortic aneurysm, without rupture, unspecified: Secondary | ICD-10-CM

## 2018-10-09 NOTE — Progress Notes (Signed)
HISTORY AND PHYSICAL     CC:  Follow up. Requesting Provider:  Kirby FunkGriffin, John, MD  HPI: This is a 78 y.o. male who is followed by Dr. Arbie CookeyEarly for AAA.  He was last seen in August 2019.   This has been known for about 4 years.  Recent CT scan at that time revealed 4.8cm infrarenal AAA.  In May 2016 it was 4.5cm.  He recommended he return in 6 months with ultrasound.  He denies lumbar or abdominal pain.    The pt is here for follow up.  Repeat AAA duplex.  He con tines to monitor his BP and takes Lisinopril and aspirin daily.  He is unable to tolerate Statins secondary to rash.   Past Medical History:  Diagnosis Date  . AAA (abdominal aortic aneurysm) (HCC)    4.2 cm by CTA 10/01/13- states "much smaller than first expected"Dr. Valentina LucksGriffin follows-checks every 2 yrs  . Allergic asthma   . Allergic rhinitis   . Arthritis    torn rotator cuff left shoulder  . Bruise    left chest area"recent fall"-no rib fracture identified.  Marland Kitchen. 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  . Colitis   . GERD (gastroesophageal reflux disease)   . HLD (hyperlipidemia)   . HTN (hypertension)   . Obesity   . Polio    left leg  . Pupil irregularity, right    old injury"pupil will not contract to light"  . Shortness of breath    exertion, asthma attack    Past Surgical History:  Procedure Laterality Date  . CARDIAC CATHETERIZATION     2006- 1 week prior tp CABPG  . CATARACT EXTRACTION    . COLONOSCOPY WITH PROPOFOL N/A 04/27/2016   Procedure: COLONOSCOPY WITH PROPOFOL;  Surgeon: Charolett BumpersMartin K Johnson, MD;  Location: [facility];  Service: Endoscopy;  Laterality: N/A;  . CORONARY ARTERY BYPASS GRAFT  3/06   x4 vessel bypass [facility]-No problems since.  Marland Kitchen. EYE SURGERY     rebuild eye socket (R)  . KNEE ARTHROSCOPY Left    left knee scope  . LUMBAR DISC SURGERY  2011  . LUMBAR LAMINECTOMY/DECOMPRESSION MICRODISCECTOMY Left 11/07/2013   Procedure: LUMBAR LAMINECTOMY/DECOMPRESSION MICRODISCECTOMY 1 LEVEL lumbar  two/three;  Surgeon: Tia Alertavid S Jones, MD;  Location: [facility];  Service: Neurosurgery;  Laterality: Left;  . NASAL SINUS SURGERY      Allergies  Allergen Reactions  . Aspirin Hives  . Codeine Hives  . Fluticasone-Salmeterol Hives  . Montelukast Sodium Hives  . Adhesive [Tape] Rash  . Cephalexin Rash  . Ibuprofen Rash  . Latex Rash  . Penicillins Rash  . Statins Rash  . Sulfonamide Derivatives Rash    Current Outpatient Medications  Medication Sig Dispense Refill  . albuterol (PROAIR HFA) 108 (90 Base) MCG/ACT inhaler INHALE 2 PUFFS INTO THE LUNGS EVERY 6 HOURS IF NEEDED FOR WHEEZING OR SHORTNESS OF BREATH (Patient not taking: Reported on 04/10/2018) 1 Inhaler 0  . albuterol (PROVENTIL) (2.5 MG/3ML) 0.083% nebulizer solution Take 3 mLs (2.5 mg total) by nebulization every 4 (four) hours as needed for wheezing or shortness of breath. 150 mL 12  . aspirin EC 81 MG tablet Take 81 mg by mouth daily.    . Azelastine-Fluticasone (DYMISTA) 137-50 MCG/ACT SUSP Place 1-2 puffs into the nose at bedtime. 1 Bottle prn  . doxycycline (VIBRAMYCIN) 100 MG capsule Take 1 capsule (100 mg total) by mouth 2 (two) times daily. (Patient not taking: Reported on  04/10/2018) 20 capsule 0  . fexofenadine (ALLEGRA) 180 MG tablet Take 180 mg by mouth daily.      . Glycopyrrolate-Formoterol (BEVESPI AEROSPHERE) 9-4.8 MCG/ACT AERO Inhale 2 puffs into the lungs 2 (two) times daily. 1 Inhaler 11  . lisinopril (PRINIVIL,ZESTRIL) 10 MG tablet Take 10 mg by mouth every morning.     . predniSONE (DELTASONE) 5 MG tablet Take 1 tablet (5 mg total) by mouth daily as needed (asthma). 30 tablet 5   No current facility-administered medications for this visit.     Family History  Problem Relation Age of Onset  . Alzheimer's disease Mother   . Liver disease Sister   . Lung cancer Brother     Social History   Socioeconomic History  . Marital status: Married    Spouse name: Not on file  . Number of children: Not on  file  . Years of education: Not on file  . Highest education level: Not on file  Occupational History  . Occupation: Press photographershuttle driver    Employer: [employer]    Comment: [employer]  Social Needs  . Financial resource strain: Not on file  . Food insecurity:    Worry: Not on file    Inability: Not on file  . Transportation needs:    Medical: Not on file    Non-medical: Not on file  Tobacco Use  . Smoking status: Former Smoker    Packs/day: 1.00    Years: 37.00    Pack years: 37.00    Types: Cigarettes    Last attempt to quit: 08/23/1992    Years since quitting: 26.1  . Smokeless tobacco: Never Used  Substance and Sexual Activity  . Alcohol use: Not Currently    Frequency: Never    Comment: socially   . Drug use: No  . Sexual activity: Not on file  Lifestyle  . Physical activity:    Days per week: Not on file    Minutes per session: Not on file  . Stress: Not on file  Relationships  . Social connections:    Talks on phone: Not on file    Gets together: Not on file    Attends religious service: Not on file    Active member of club or organization: Not on file    Attends meetings of clubs or organizations: Not on file    Relationship status: Not on file  . Intimate partner violence:    Fear of current or ex partner: Not on file    Emotionally abused: Not on file    Physically abused: Not on file    Forced sexual activity: Not on file  Other Topics Concern  . Not on file  Social History Narrative  . Not on file     REVIEW OF SYSTEMS:   [X]  denotes positive finding, [ ]  denotes negative finding Cardiac  Comments:  Chest pain or chest pressure:    Shortness of breath upon exertion:    Short of breath when lying flat:    Irregular heart rhythm:        Vascular    Pain in calf, thigh, or hip brought on by ambulation:    Pain in feet at night that wakes you up from your sleep:     Blood clot in your veins:    Leg swelling:         Pulmonary    Oxygen at home:     Productive cough:     Wheezing:  Neurologic    Sudden weakness in arms or legs:     Sudden numbness in arms or legs:     Sudden onset of difficulty speaking or slurred speech:    Temporary loss of vision in one eye:     Problems with dizziness:         Gastrointestinal    Blood in stool:     Vomited blood:         Genitourinary    Burning when urinating:     Blood in urine:        Psychiatric    Major depression:         Hematologic    Bleeding problems:    Problems with blood clotting too easily:        Skin    Rashes or ulcers:        Constitutional    Fever or chills:      PHYSICAL EXAMINATION:    General:  WDWN in NAD; vital signs documented above Gait: Not observed HENT: WNL, normocephalic Pulmonary: normal non-labored breathing , without Rales, rhonchi,  wheezing Cardiac: regular HR, without  Murmurs without carotid bruit Abdomen: soft, NT, no masses Skin: without rashes Vascular Exam/Pulses:palapble Femoral, DP/PT B LE, radial pulses B UE  Extremities: without ischemic changes, without Gangrene , without cellulitis; without open wounds;  Musculoskeletal: positive left LEmuscle wasting and atrophy hx of polio Neurologic: A&O X 3;  No focal weakness or paresthesias are detected Psychiatric:  The pt has Normal affect.   Non-Invasive Vascular Imaging:   AAA duplex 10/10/2018:   Abdominal Aorta Findings: +-----------+-------+----------+----------+--------+--------+--------+ Location   AP (cm)Trans (cm)PSV (cm/s)WaveformThrombusComments +-----------+-------+----------+----------+--------+--------+--------+ Proximal   2.50             31                                 +-----------+-------+----------+----------+--------+--------+--------+ Mid        1.97   1.97      58                                 +-----------+-------+----------+----------+--------+--------+--------+ Distal     4.65   4.78      63                 Present          +-----------+-------+----------+----------+--------+--------+--------+ RT CIA Prox1.2    1.2       132                                +-----------+-------+----------+----------+--------+--------+--------+ LT CIA Prox0.7    0.8       172                                +-----------+-------+----------+----------+--------+--------+--------+     Summary: Abdominal Aorta: There is evidence of abnormal dilitation of the Distal Abdominal aorta measuring 4.65 x 4.78 cms..       ASSESSMENT/PLAN:: 78 y.o. male here for follow up for AAA4.78 cm.  He has had very slow growth over the course of 3 years.  I have recommended ultrasound at 19-month interval to rule out any progression in size.  We reviewed symptoms of sever abdominal or lumbar pain and recommend  call 911 if he has these symptoms.  Otherwise he will f/u in 6 months for repeat duplex of his AAA.   Mosetta Pigeon , PA-C [facility] [phone]  Clinic MD:   Early

## 2018-10-10 ENCOUNTER — Ambulatory Visit (HOSPITAL_COMMUNITY)
Admission: RE | Admit: 2018-10-10 | Discharge: 2018-10-10 | Disposition: A | Discharge status: Home / Self Care | Payer: Medicare Other | Source: Ambulatory Visit | Attending: Family | Admitting: Family

## 2018-10-10 ENCOUNTER — Encounter: Payer: Self-pay | Admitting: Family

## 2018-10-10 ENCOUNTER — Other Ambulatory Visit: Payer: Self-pay

## 2018-10-10 ENCOUNTER — Ambulatory Visit: Payer: Medicare Other | Admitting: Physician Assistant

## 2018-10-10 VITALS — BP 156/83 | HR 63 | Temp 97.0°F | Resp 14 | Ht 68.0 in | Wt 194.0 lb

## 2018-10-10 DIAGNOSIS — I714 Abdominal aortic aneurysm, without rupture, unspecified: Secondary | ICD-10-CM

## 2019-04-03 ENCOUNTER — Other Ambulatory Visit: Payer: Self-pay

## 2019-04-03 DIAGNOSIS — I714 Abdominal aortic aneurysm, without rupture, unspecified: Secondary | ICD-10-CM

## 2019-04-10 ENCOUNTER — Ambulatory Visit (HOSPITAL_COMMUNITY)
Admission: RE | Admit: 2019-04-10 | Discharge: 2019-04-10 | Disposition: A | Discharge status: Home / Self Care | Payer: Medicare Other | Source: Ambulatory Visit | Attending: Family | Admitting: Family

## 2019-04-10 ENCOUNTER — Ambulatory Visit: Payer: Medicare Other | Admitting: Family

## 2019-04-10 ENCOUNTER — Other Ambulatory Visit: Payer: Self-pay

## 2019-04-10 DIAGNOSIS — I714 Abdominal aortic aneurysm, without rupture, unspecified: Secondary | ICD-10-CM

## 2019-04-13 ENCOUNTER — Telehealth: Payer: Self-pay | Admitting: *Deleted

## 2019-04-13 NOTE — Telephone Encounter (Signed)
Virtual Visit Pre-Appointment Phone Call  Today, I spoke with Thomas [name] and performed the following actions:  1. I explained that we are currently trying to limit exposure to the COVID-19 virus by seeing patients at home rather than in the office.  I explained that the visits are best done by video, but can be done by telephone.  I asked the patient if a virtual visit that the patient would like to try instead of coming into the office. Thomas [name] agreed to proceed with the virtual visit scheduled with Thomas [name] on 04/16/19.      I confirmed the BEST phone number to call the day of the visit and- I included this in appointment notes.  2. I asked if the patient had access to (through a family member/friend) a smartphone with video capability to be used for his visit?"  The patient said yes -    3. I confirmed consent by  a. sending through Cross or by email the [place] as written at the end of this message or  b. verbally as listed below. i. This visit is being performed in the setting of COVID-19. ii. All virtual visits are billed to your insurance company just like a normal visit would be.   iii. We'd like you to understand that the technology does not allow for your provider to perform an examination, and thus may limit your provider's ability to fully assess your condition.  iv. If your provider identifies any concerns that need to be evaluated in person, we will make arrangements to do so.   v. Finally, though the technology is pretty good, we cannot assure that it will always work on either your or our end, and in the setting of a video visit, we may have to convert it to a phone-only visit.  In either situation, we cannot ensure that we have a secure connection.   vi. Are you willing to proceed?"  STAFF: Did the patient verbally acknowledge consent to telehealth visit? Document YES/NO here: YES  2. I advised the patient to be  prepared - I asked that the patient, on the day of his visit, record any information possible with the equipment at his home, such as blood pressure, pulse, oxygen saturation, and your weight and write them all down. I asked the patient to have a pen and paper handy nearby the day of the visit as well.  3. If the patient was scheduled for a video visit, I informed the patient that the visit with the doctor would start with a text to the smartphone # given to [place] by the patient.         If the patient was scheduled for a telephone call, I informed the patient that the visit with the doctor would start with a call to the telephone # given to [place] by the patient.  4. I Informed patient they will receive a phone call 15 minutes prior to their appointment time from a [place] or nurse to review medications, allergies, etc. to prepare for the visit.    TELEPHONE CALL NOTE  Thomas [name] has been deemed a candidate for a follow-up tele-health visit to limit community exposure during the Covid-19 pandemic. I spoke with the patient via phone to ensure availability of phone/video source, confirm preferred email & phone number, and discuss instructions and expectations.  I reminded Thomas [name] to be prepared with any vital sign and/or  heart rhythm information that could potentially be obtained via home monitoring, at the time of his visit. I reminded Thomas [name] to expect a phone call prior to his visit.  Thomas CocoLathan, Thomas [name], Thomas [name] 04/13/2019 12:08 PM     FULL LENGTH CONSENT FOR TELE-HEALTH VISIT   I hereby voluntarily request, consent and authorize [facility] and its employed or contracted physicians, physician assistants, nurse practitioners or other licensed health care professionals (the Practitioner), to provide me with telemedicine health care services (the "Services") as deemed necessary by the treating Practitioner. I acknowledge and consent to receive the Services by the Practitioner via  telemedicine. I understand that the telemedicine visit will involve communicating with the Practitioner through live audiovisual communication technology and the disclosure of certain medical information by electronic transmission. I acknowledge that I have been given the opportunity to request an in-person assessment or other available alternative prior to the telemedicine visit and am voluntarily participating in the telemedicine visit.  I understand that I have the right to withhold or withdraw my consent to the use of telemedicine in the course of my care at any time, without affecting my right to future care or treatment, and that the Practitioner or I may terminate the telemedicine visit at any time. I understand that I have the right to inspect all information obtained and/or recorded in the course of the telemedicine visit and may receive copies of available information for a reasonable fee.  I understand that some of the potential risks of receiving the Services via telemedicine include:  Marland Kitchen. Delay or interruption in medical evaluation due to technological equipment failure or disruption; . Information transmitted may not be sufficient (e.g. poor resolution of images) to allow for appropriate medical decision making by the Practitioner; and/or  . In rare instances, security protocols could fail, causing a breach of personal health information.  Furthermore, I acknowledge that it is my responsibility to provide information about my medical history, conditions and care that is complete and accurate to the best of my ability. I acknowledge that Practitioner's advice, recommendations, and/or decision may be based on factors not within their control, such as incomplete or inaccurate data provided by me or distortions of diagnostic images or specimens that may result from electronic transmissions. I understand that the practice of medicine is not an exact science and that Practitioner makes no warranties or  guarantees regarding treatment outcomes. I acknowledge that I will receive a copy of this consent concurrently upon execution via email to the email address I last provided but may also request a printed copy by calling the office of [facility].    I understand that my insurance will be billed for this visit.   I have read or had this consent read to me. . I understand the contents of this consent, which adequately explains the benefits and risks of the Services being provided via telemedicine.  . I have been provided ample opportunity to ask questions regarding this consent and the Services and have had my questions answered to my satisfaction. . I give my informed consent for the services to be provided through the use of telemedicine in my medical care  By participating in this telemedicine visit I agree to the above.

## 2019-04-16 ENCOUNTER — Ambulatory Visit (INDEPENDENT_AMBULATORY_CARE_PROVIDER_SITE_OTHER): Payer: Medicare Other | Admitting: Family

## 2019-04-16 ENCOUNTER — Other Ambulatory Visit: Payer: Self-pay

## 2019-04-16 ENCOUNTER — Encounter: Payer: Self-pay | Admitting: Family

## 2019-04-16 VITALS — BP 130/80 | HR 62 | Ht 68.0 in | Wt 192.0 lb

## 2019-04-16 DIAGNOSIS — I714 Abdominal aortic aneurysm, without rupture, unspecified: Secondary | ICD-10-CM

## 2019-04-16 NOTE — Progress Notes (Signed)
Virtual Visit via Telephone Note   I connected with Thomas BattlesHerman L Hon on 04/16/2019 using the D[employer]me by telephone and verified that I was speaking with the correct person using two identifiers. Patient was located at his home and accompanied by himself. I am located at the [facility] office[facility]clinic.   The limitations of evaluation and management by telemedicine and the availability of in person appointments have been previously discussed with the patient and are documented in the patients chart. The patient expressed understanding and consented to proceed.  PCP: Kirby FunkGriffin, John, MD  Chief Complaint: Follow up AAA  History of Present Illness: Thomas [name] is a 78 y.o. male who has been monitored by Dr. Arbie CookeyEarly for AAA.  This has been known since about 2016.. CT scan on 03-23-18 revealed a 4.8cm infrarenal AAA.  In May 2016 it was 4.5cm.    He denies lumbar or abdominal pain.    He con tines to monitor his BP and takes Lisinopril and aspirin daily.  He is unable to tolerate Statins secondary to rash  Past Medical History:  Diagnosis Date  . AAA (abdominal aortic aneurysm) (HCC)    4.2 cm by CTA 10/01/13- states "much smaller than first expected"Dr. Valentina LucksGriffin follows-checks every 2 yrs  . Allergic asthma   . Allergic rhinitis   . Arthritis    torn rotator cuff left shoulder  . Bruise    left chest area"recent fall"-no rib fracture identified.  Marland Kitchen. 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  . Colitis   . GERD (gastroesophageal reflux disease)   . HLD (hyperlipidemia)   . HTN (hypertension)   . Obesity   . Polio    left leg  . Pupil irregularity, right    old injury"pupil will not contract to light"  . Shortness of breath    exertion, asthma attack    Past Surgical History:  Procedure Laterality Date  . CARDIAC CATHETERIZATION     2006- 1 week prior tp CABPG  . CATARACT EXTRACTION    . COLONOSCOPY WITH PROPOFOL N/A 04/27/2016   Procedure: COLONOSCOPY WITH PROPOFOL;  Surgeon:  Charolett BumpersMartin K Johnson, MD;  Location: [facility];  Service: Endoscopy;  Laterality: N/A;  . CORONARY ARTERY BYPASS GRAFT  3/06   x4 vessel bypass [facility]-No problems since.  Marland Kitchen. EYE SURGERY     rebuild eye socket (R)  . KNEE ARTHROSCOPY Left    left knee scope  . LUMBAR DISC SURGERY  2011  . LUMBAR LAMINECTOMY/DECOMPRESSION MICRODISCECTOMY Left 11/07/2013   Procedure: LUMBAR LAMINECTOMY/DECOMPRESSION MICRODISCECTOMY 1 LEVEL lumbar two/three;  Surgeon: Tia Alertavid S Jones, MD;  Location: [facility];  Service: Neurosurgery;  Laterality: Left;  . NASAL SINUS SURGERY      Current Meds  Medication Sig  . aspirin EC 81 MG tablet Take 81 mg by mouth daily.  . fexofenadine (ALLEGRA) 180 MG tablet Take 180 mg by mouth daily.    . Glycopyrrolate-Formoterol (BEVESPI AEROSPHERE) 9-4.8 MCG/ACT AERO Inhale 2 puffs into the lungs 2 (two) times daily.  Marland Kitchen. lisinopril (PRINIVIL,ZESTRIL) 10 MG tablet Take 10 mg by mouth every morning.     12 system ROS was negative unless otherwise noted in HPI   Observations/Objective:  DATA AAA Duplex (04-10-19): Abdominal Aorta Findings: +-----------+-------+----------+----------+--------+--------+--------+ Location   AP (cm)Trans (cm)PSV (cm/s)WaveformThrombusComments +-----------+-------+----------+----------+--------+--------+--------+ Proximal   2.52   2.44                                         +-----------+-------+----------+----------+--------+--------+--------+  Mid        4.02   3.79                                         +-----------+-------+----------+----------+--------+--------+--------+ Distal     4.67   4.72      24                Present          +-----------+-------+----------+----------+--------+--------+--------+ RT CIA Prox1.2    1.2       135                                +-----------+-------+----------+----------+--------+--------+--------+ LT CIA Prox0.9    0.9       146                                 +-----------+-------+----------+----------+--------+--------+--------+ Summary: Abdominal Aorta: There is evidence of abnormal dilitation of the Mid and Distal Abdominal aorta. The largest aortic diameter remains essentially unchanged compared to prior exam. Previous diameter measurement was 4.8 cm obtained on 10/10/2018.  Assessment and Plan: Stable asymptomatic 4.7 cm AAA.   Follow Up Instructions:   Follow up in 6 months with AAA.    I discussed the assessment and treatment plan with the patient. The patient was provided an opportunity to ask questions and all were answered. The patient agreed with the plan and demonstrated an understanding of the instructions.   The patient was advised to call back or seek an in-person evaluation if the symptoms worsen or if the condition fails to improve as anticipated.  I spent 12 minutes with the patient via telephone encounter.   Gabrielle Dare Nickel [facility] Office: [phone]  04/16/2019, 5:51 PM

## 2019-04-16 NOTE — Patient Instructions (Signed)
Before your next abdominal ultrasound:  Avoid gas forming foods and beverages the day before the test.   Take two Extra-Strength Gas-X capsules at bedtime the night before the test. Take another two Extra-Strength Gas-X capsules in the middle of the night if you get up to the restroom, if not, first thing in the morning with water.  Do not chew gum.     Abdominal Aortic Aneurysm  An aneurysm is a bulge in one of the blood vessels that carry blood away from the heart (artery). It happens when blood pushes up against a weak or damaged place in the wall of an artery. An abdominal aortic aneurysm happens in the main artery of the body (aorta). Some aneurysms may not cause problems. If it grows, it can burst or tear, causing bleeding inside the body. This is an emergency. It needs to be treated right away. What are the causes? The exact cause of this condition is not known. What increases the risk? The following may make you more likely to get this condition:  Being a male who is 60 years of age or older.  Being white (Caucasian).  Using tobacco.  Having a family history of aneurysms.  Having the following conditions: ? Hardening of the arteries (arteriosclerosis). ? Inflammation of the walls of an artery (arteritis). ? Certain genetic conditions. ? Being very overweight (obesity). ? An infection in the wall of the aorta (infectious aortitis). ? High cholesterol. ? High blood pressure (hypertension). What are the signs or symptoms? Symptoms depend on the size of the aneurysm and how fast it is growing. Most grow slowly and do not cause any symptoms. If symptoms do occur, they may include:  Pain in the belly (abdomen), side, or back.  Feeling full after eating only small amounts of food.  Feeling a throbbing lump in the belly. Symptoms that the aneurysm has burst (ruptured) include:  Sudden, very bad pain in the belly, side, or back.  Feeling sick to your stomach (nauseous).   Throwing up (vomiting).  Feeling light-headed or passing out. How is this treated? Treatment for this condition depends on:  The size of the aneurysm.  How fast it is growing.  Your age.  Your risk of having it burst. If your aneurysm is smaller than 2 inches (5 cm), your doctor may manage it by:  Checking it often to see if it is getting bigger. You may have an imaging test (ultrasound) to check it every 3-6 months, every year, or every few years.  Giving you medicines to: ? Control blood pressure. ? Treat pain. ? Fight infection. If your aneurysm is larger than 2 inches (5 cm), you may need surgery to fix it. Follow these instructions at home: Lifestyle  Do not use any products that have nicotine or tobacco in them. This includes cigarettes, e-cigarettes, and chewing tobacco. If you need help quitting, ask your doctor.  Get regular exercise. Ask your doctor what types of exercise are best for you. Eating and drinking  Eat a heart-healthy diet. This includes eating plenty of: ? Fresh fruits and vegetables. ? Whole grains. ? Low-fat (lean) protein. ? Low-fat dairy products.  Avoid foods that are high in saturated fat and cholesterol. These foods include red meat and some dairy products.  Do not drink alcohol if: ? Your doctor tells you not to drink. ? You are pregnant, may be pregnant, or are planning to become pregnant.  If you drink alcohol: ? Limit how much you use   to:  0-1 drink a day for women.  0-2 drinks a day for men. ? Be aware of how much alcohol is in your drink. In the [place] one drink equals any of these:  One typical bottle of beer (12 oz).  One-half glass of wine (5 oz).  One shot of hard liquor (1 oz). General instructions  Take over-the-counter and prescription medicines only as told by your doctor.  Keep your blood pressure within normal limits. Ask your doctor what your blood pressure should be.  Have your blood sugar (glucose) level  and cholesterol levels checked regularly. Keep your blood sugar level and cholesterol levels within normal limits.  Avoid heavy lifting and activities that take a lot of effort. Ask your doctor what activities are safe for you.  Keep all follow-up visits as told by your doctor. This is important. ? Talk to your doctor about regular screenings to see if the aneurysm is getting bigger. Contact a doctor if you:  Have pain in your belly, side, or back.  Have a throbbing feeling in your belly.  Have a family history of aneurysms. Get help right away if you:  Have sudden, bad pain in your belly, side, or back.  Feel sick to your stomach.  Throw up.  Have trouble pooping (constipation).  Have trouble peeing (urinating).  Feel light-headed.  Have a fast heart rate when you stand.  Have sweaty skin that is cold to the touch (clammy).  Have shortness of breath.  Have a fever. These symptoms may be an emergency. Do not wait to see if the symptoms will go away. Get medical help right away. Call your local emergency services (911 in the [place] Do not drive yourself to the hospital. Summary  An aneurysm is a bulge in one of the blood vessels that carry blood away from the heart (artery). Some aneurysms may not cause problems.  You may need to have yours checked often. If it grows, it can burst or tear. This causes bleeding inside the body. It needs to be treated right away.  Follow instructions from your doctor about healthy lifestyle changes.  Keep all follow-up visits as told by your doctor. This is important. This information is not intended to replace advice given to you by your health care provider. Make sure you discuss any questions you have with your health care provider. Document Released: 12/04/2012 Document Revised: 11/27/2018 Document Reviewed: 03/18/2018 Elsevier Patient Education  2020 [employer] Inc.  

## 2019-04-17 ENCOUNTER — Other Ambulatory Visit: Payer: Self-pay

## 2019-04-17 DIAGNOSIS — Z20822 Contact with and (suspected) exposure to covid-19: Secondary | ICD-10-CM

## 2019-04-18 LAB — NOVEL CORONAVIRUS, NAA: SARS-CoV-2, NAA: NOT DETECTED

## 2019-09-03 ENCOUNTER — Ambulatory Visit: Payer: Medicare Other

## 2019-09-09 ENCOUNTER — Ambulatory Visit: Payer: Medicare Other | Attending: Internal Medicine

## 2019-09-09 DIAGNOSIS — Z23 Encounter for immunization: Secondary | ICD-10-CM | POA: Insufficient documentation

## 2019-09-09 NOTE — Progress Notes (Signed)
   Covid-19 Vaccination Clinic  Name:  Thomas [name]    MRN: [id number] DOB: Jul 17, 1941  09/09/2019  Mr. Thomas [name] was observed post Covid-19 immunization for 30 minutes based on pre-vaccination screening without incidence. He was provided with Vaccine Information Sheet and instruction to access the V-Safe system.   Mr. Thomas [name] was instructed to call 911 with any severe reactions post vaccine: Marland Kitchen Difficulty breathing  . Swelling of your face and throat  . A fast heartbeat  . A bad rash all over your body  . Dizziness and weakness

## 2019-09-27 ENCOUNTER — Ambulatory Visit: Payer: Medicare Other | Attending: Internal Medicine

## 2019-09-27 DIAGNOSIS — Z23 Encounter for immunization: Secondary | ICD-10-CM | POA: Insufficient documentation

## 2019-09-27 NOTE — Progress Notes (Signed)
   Covid-19 Vaccination Clinic  Name:  BRENTEN JANNEY    MRN: [id number] DOB: [birth year]  09/27/2019  Mr. Maxcy was observed post Covid-19 immunization for 15 minutes without incidence. He was provided with Vaccine Information Sheet and instruction to access the V-Safe system.   Mr. Gowin was instructed to call 911 with any severe reactions post vaccine: Marland Kitchen Difficulty breathing  . Swelling of your face and throat  . A fast heartbeat  . A bad rash all over your body  . Dizziness and weakness    Immunizations Administered    Name Date Dose VIS Date Route   Pfizer COVID-19 Vaccine 09/27/2019 10:28 AM 0.3 mL 08/03/2019 Intramuscular   Manufacturer: [employer], [employer]   Lot: [id number]   NDC: [address]-4

## 2019-09-30 ENCOUNTER — Emergency Department (HOSPITAL_COMMUNITY): Payer: Medicare Other

## 2019-09-30 ENCOUNTER — Encounter (HOSPITAL_COMMUNITY): Payer: Self-pay

## 2019-09-30 ENCOUNTER — Inpatient Hospital Stay (HOSPITAL_COMMUNITY)
Admission: EM | Admit: 2019-09-30 | Discharge: 2019-10-03 | DRG: 482 | Disposition: A | Discharge status: Home Health | Payer: Medicare Other | Attending: Family Medicine | Admitting: Family Medicine

## 2019-09-30 DIAGNOSIS — Z880 Allergy status to penicillin: Secondary | ICD-10-CM

## 2019-09-30 DIAGNOSIS — Z820 Family history of epilepsy and other diseases of the nervous system: Secondary | ICD-10-CM

## 2019-09-30 DIAGNOSIS — Z801 Family history of malignant neoplasm of trachea, bronchus and lung: Secondary | ICD-10-CM

## 2019-09-30 DIAGNOSIS — I2510 Atherosclerotic heart disease of native coronary artery without angina pectoris: Secondary | ICD-10-CM | POA: Diagnosis present

## 2019-09-30 DIAGNOSIS — E785 Hyperlipidemia, unspecified: Secondary | ICD-10-CM | POA: Diagnosis present

## 2019-09-30 DIAGNOSIS — Y92010 Kitchen of single-family (private) house as the place of occurrence of the external cause: Secondary | ICD-10-CM

## 2019-09-30 DIAGNOSIS — Z87891 Personal history of nicotine dependence: Secondary | ICD-10-CM

## 2019-09-30 DIAGNOSIS — Z882 Allergy status to sulfonamides status: Secondary | ICD-10-CM

## 2019-09-30 DIAGNOSIS — Z951 Presence of aortocoronary bypass graft: Secondary | ICD-10-CM

## 2019-09-30 DIAGNOSIS — Z91040 Latex allergy status: Secondary | ICD-10-CM

## 2019-09-30 DIAGNOSIS — S72009A Fracture of unspecified part of neck of unspecified femur, initial encounter for closed fracture: Secondary | ICD-10-CM | POA: Diagnosis present

## 2019-09-30 DIAGNOSIS — Z888 Allergy status to other drugs, medicaments and biological substances status: Secondary | ICD-10-CM

## 2019-09-30 DIAGNOSIS — I714 Abdominal aortic aneurysm, without rupture: Secondary | ICD-10-CM | POA: Diagnosis present

## 2019-09-30 DIAGNOSIS — I119 Hypertensive heart disease without heart failure: Secondary | ICD-10-CM | POA: Diagnosis present

## 2019-09-30 DIAGNOSIS — Z885 Allergy status to narcotic agent status: Secondary | ICD-10-CM

## 2019-09-30 DIAGNOSIS — Z7982 Long term (current) use of aspirin: Secondary | ICD-10-CM

## 2019-09-30 DIAGNOSIS — K219 Gastro-esophageal reflux disease without esophagitis: Secondary | ICD-10-CM | POA: Diagnosis present

## 2019-09-30 DIAGNOSIS — Z886 Allergy status to analgesic agent status: Secondary | ICD-10-CM

## 2019-09-30 DIAGNOSIS — R52 Pain, unspecified: Secondary | ICD-10-CM

## 2019-09-30 DIAGNOSIS — S72002A Fracture of unspecified part of neck of left femur, initial encounter for closed fracture: Principal | ICD-10-CM | POA: Diagnosis present

## 2019-09-30 DIAGNOSIS — Z419 Encounter for procedure for purposes other than remedying health state, unspecified: Secondary | ICD-10-CM

## 2019-09-30 DIAGNOSIS — J449 Chronic obstructive pulmonary disease, unspecified: Secondary | ICD-10-CM | POA: Diagnosis present

## 2019-09-30 DIAGNOSIS — Z20822 Contact with and (suspected) exposure to covid-19: Secondary | ICD-10-CM | POA: Diagnosis present

## 2019-09-30 DIAGNOSIS — I739 Peripheral vascular disease, unspecified: Secondary | ICD-10-CM | POA: Diagnosis present

## 2019-09-30 DIAGNOSIS — W010XXA Fall on same level from slipping, tripping and stumbling without subsequent striking against object, initial encounter: Secondary | ICD-10-CM | POA: Diagnosis present

## 2019-09-30 DIAGNOSIS — T148XXA Other injury of unspecified body region, initial encounter: Secondary | ICD-10-CM

## 2019-09-30 DIAGNOSIS — Z79899 Other long term (current) drug therapy: Secondary | ICD-10-CM

## 2019-09-30 DIAGNOSIS — R339 Retention of urine, unspecified: Secondary | ICD-10-CM | POA: Diagnosis not present

## 2019-09-30 MED ORDER — FENTANYL CITRATE (PF) 100 MCG/2ML IJ SOLN
50.0000 ug | Freq: Once | INTRAMUSCULAR | Status: AC
  Administered 2019-09-30: 50 ug via INTRAVENOUS
  Filled 2019-09-30: qty 2

## 2019-09-30 NOTE — ED Provider Notes (Signed)
[facility] EMERGENCY DEPARTMENT Provider Note   CSN: [id number] Arrival date & time: 09/30/19  2210     History Chief Complaint  Patient presents with  . Knee Pain    Thomas [name] is a 79 y.o. 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  The patient reports that he slipped on some liquid on his kitchen floor at approximately 2100. Reports that he fell from standing onto his left leg. He was unable to stand or get out of the floor after the fall. He denies hitting his head, syncope, nausea or vomiting.    He is endorsing pain in his left knee, thigh, and left hip. Maximal pain is in the left knee. Pain is nonradiating, sharp and throbbing, and constant. He was given 10 mg of morphine in route with EMS with some improvement in his pain. Pain is currently 5 out of 10 at rest, worsening to 10 out of 10 with any attempt at movement. No chest, abdominal pain, neck, or back pain.No left ankle pain, numbness, or weakness.   EMS gave the patient 10 mg of morphine in route. He has a history of a left arthroscopy to the left knee. No other surgery to the left lower extremity. He takes an 81 mg aspirin daily. No blood thinners. Tdap updated in 2019.  He is scheduled to follow-up for his AAA with vascular surgery on 2/25 with Dr. Valentina Lucks.   He is followed by cardiology.  Per chart review from cardiology note on 5/12: CAD s/p CABG 3/06 with LIMA-LAD, SVG-ramus, SVG-OM, SVG-PDA.  EF 55-60% on cath at that time.  ETT-myoview (8/10) with EF 64%, no evidence for ischemia or infarction.   The history is provided by the patient. No language interpreter was used.       Past Medical History:  Diagnosis Date  . AAA (abdominal aortic aneurysm) (HCC)    4.2 cm by CTA 10/01/13- states "much smaller than first expected"Dr. Valentina Lucks follows-checks every 2 yrs  .  Allergic asthma   . Allergic rhinitis   . Arthritis    torn rotator cuff left shoulder  . Bruise    left chest area"recent fall"-no rib fracture identified.  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 Colitis   . GERD (gastroesophageal reflux disease)   . HLD (hyperlipidemia)   . HTN (hypertension)   . Obesity   . Polio    left leg  . Pupil irregularity, right    old injury"pupil will not contract to light"  . Shortness of breath    exertion, asthma attack    Patient Active Problem List   Diagnosis Date Noted  . Hip fracture (HCC) 10/01/2019  . Nocturnal leg cramps 09/27/2014  . S/P lumbar microdiscectomy 11/07/2013  . Lumbar disc disease 10/29/2013  . CAD (coronary artery disease)   . Obesity (BMI 30-39.9)   . Pupil diameter unequal   . Hyperlipidemia   . Hypertensive heart disease   . 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 Esophageal reflux 01/01/2008  . Seasonal and perennial allergic rhinitis     Past Surgical History:  Procedure Laterality Date  . CARDIAC CATHETERIZATION     2006- 1 week prior tp CABPG  . CATARACT EXTRACTION    . COLONOSCOPY WITH PROPOFOL N/A 04/27/2016   Procedure: COLONOSCOPY WITH PROPOFOL;  Surgeon: Charolett BumpersMartin K Johnson, MD;  Location: [facility];  Service: Endoscopy;  Laterality: N/A;  . CORONARY ARTERY BYPASS GRAFT  3/06   x4 vessel bypass [facility]-No problems since.  Marland Kitchen. EYE SURGERY     rebuild eye socket (R)  . KNEE ARTHROSCOPY Left    left knee scope  . LUMBAR DISC SURGERY  2011  . LUMBAR LAMINECTOMY/DECOMPRESSION MICRODISCECTOMY Left 11/07/2013   Procedure: LUMBAR LAMINECTOMY/DECOMPRESSION MICRODISCECTOMY 1 LEVEL lumbar two/three;  Surgeon: Tia Alertavid S Jones, MD;  Location: [facility];  Service: Neurosurgery;  Laterality: Left;  . NASAL SINUS SURGERY         Family History  Problem Relation Age of Onset  . Alzheimer's disease Mother   . Liver disease Sister   . Lung cancer Brother     Social History   Tobacco Use  . Smoking  status: Former Smoker    Packs/day: 1.00    Years: 37.00    Pack years: 37.00    Types: Cigarettes    Quit date: 08/23/1992    Years since quitting: 27.1  . Smokeless tobacco: Never Used  Substance Use Topics  . Alcohol use: Not Currently    Comment: socially   . Drug use: No    Home Medications Prior to Admission medications   Medication Sig Start Date End Date Taking? Authorizing Provider  aspirin EC 81 MG tablet Take 81 mg by mouth daily.   Yes [provider]  DULERA 200-5 MCG/ACT AERO Inhale 2 puffs into the lungs 2 (two) times daily. 05/29/19  Yes [provider]  fexofenadine (ALLEGRA) 180 MG tablet Take 180 mg by mouth daily as needed for allergies.    Yes [provider]  lisinopril (PRINIVIL,ZESTRIL) 10 MG tablet Take 10 mg by mouth daily.    Yes [provider]  PROAIR HFA 108 (90 Base) MCG/ACT inhaler Inhale 2 puffs into the lungs every 4 (four) hours as needed for wheezing.  09/19/19  Yes [provider]  albuterol (PROVENTIL) (2.5 MG/3ML) 0.083% nebulizer solution Take 3 mLs (2.5 mg total) by nebulization every 4 (four) hours as needed for wheezing or shortness of breath. Patient not taking: Reported on 10/01/2019 10/07/14   Waymon BudgeYoung, Clinton D, MD  Glycopyrrolate-Formoterol (BEVESPI AEROSPHERE) 9-4.8 MCG/ACT AERO Inhale 2 puffs into the lungs 2 (two) times daily. Patient not taking: Reported on 10/01/2019 11/03/16   Waymon BudgeYoung, Clinton D, MD  predniSONE (DELTASONE) 5 MG tablet Take 1 tablet (5 mg total) by mouth daily as needed (asthma). Patient not taking: Reported on 10/01/2019 12/10/14   Jetty DuhamelYoung, Clinton D, MD    Allergies    Aspirin, Codeine, Fluticasone-salmeterol, Montelukast sodium, Adhesive [tape], Cephalexin, Ibuprofen, Latex, Penicillins, Statins, and Sulfonamide derivatives  Review of Systems   Review of Systems  Constitutional: Negative for activity change, chills and fever.  Respiratory: Negative for shortness of breath.     Cardiovascular: Negative for chest pain and leg swelling.  Gastrointestinal: Negative for abdominal pain, diarrhea, nausea and vomiting.  Musculoskeletal: Positive for arthralgias, gait problem and myalgias. Negative for back pain, joint swelling, neck pain and neck stiffness.  Skin: Negative for color change, rash and wound.  Neurological: Negative for dizziness, syncope, weakness, numbness and headaches.    Physical Exam Updated Vital Signs BP (!) 135/97   Pulse 64   Temp 98.2 F (36.8 C) (Oral)   Resp 15   SpO2 95%   Physical Exam Vitals and nursing note reviewed.  Constitutional:      General: He  is not in acute distress.    Appearance: He is well-developed. He is not ill-appearing, toxic-appearing or diaphoretic.     Comments: Well-appearing. No acute distress in bed with his left knee elevated on a pillow.  HENT:     Head: Normocephalic.  Eyes:     Conjunctiva/sclera: Conjunctivae normal.  Cardiovascular:     Rate and Rhythm: Normal rate and regular rhythm.     Heart sounds: No murmur.  Pulmonary:     Effort: Pulmonary effort is normal. No respiratory distress.     Breath sounds: No stridor. No wheezing, rhonchi or rales.     Comments: Lungs are clear. No hypoxia or tachypnea.  No tenderness to palpation to the chest wall or ribs bilaterally. Chest:     Chest wall: No tenderness.  Abdominal:     General: There is no distension.     Palpations: Abdomen is soft. There is no mass.     Tenderness: There is no abdominal tenderness. There is no right CVA tenderness, left CVA tenderness, guarding or rebound.     Hernia: No hernia is present.     Comments: Abdomen is soft and nontender.  Musculoskeletal:        General: Tenderness present. No swelling or deformity.     Cervical back: Neck supple.     Right lower leg: No edema.     Left lower leg: No edema.     Comments: Tender to palpation over the left lateral tibial plateau and lateral joint line of the left knee. No  medial tibial plateau tenderness or medial joint line tenderness. No swelling or redness noted to the knee. No wounds. Patient is unable to tolerate any range of motion due to severe pain.   There is also tenderness to the mid thigh along the anterior and lateral aspect.  Point tenderness to the left lateral hip. Left leg is externally rotated. Questionable shortening?  Normal exam of the left ankle. No tenderness. DP and PT pulses are 2+ and symmetric. Sensation is intact and equal throughout the bilateral lower extremities. Good capillary refill of all digits. 4 out of 5 strength on the left compared to 5 out of 5 strength on the right with dorsiflexion plantarflexion secondary to pain.  No tenderness to the cervical, thoracic, or lumbar spinous processes or bilateral paraspinal muscles.  Skin:    General: Skin is warm and dry.     Comments: Superficial abrasion and contusion noted to the proximal left forearm.  There is no associated tenderness of the left wrist or elbow.  No bony tenderness to the left forearm.  No crepitus or step-offs.  Full active and passive range of motion of the left wrist, elbow, and shoulder.  Neurovascularly intact.  Neurological:     Mental Status: He is alert.     Comments: Alert and oriented x3.  Sensation is intact and equal throughout.  Cranial nerves II through XII are grossly intact.  Moves all 4 extremities spontaneously.  There is decreased motor strength on the left lower extremity secondary to pain, but otherwise no motor deficits.   Psychiatric:        Behavior: Behavior normal.     ED Results / Procedures / Treatments   Labs (all labs ordered are listed, but only abnormal results are displayed) Labs Reviewed  BASIC METABOLIC PANEL - Abnormal; Notable for the following components:      Result Value   Glucose, Bld 128 (*)    Calcium 8.8 (*)  All other components within normal limits  CBC WITH DIFFERENTIAL/PLATELET - Abnormal; Notable for the  following components:   RBC 3.92 (*)    Hemoglobin 12.4 (*)    HCT 37.9 (*)    All other components within normal limits  RESPIRATORY PANEL BY RT PCR (FLU A&B, COVID)  PROTIME-INR  TYPE AND SCREEN  ABO/RH    EKG EKG Interpretation  Date/Time:  Sunday September 30 2019 23:42:05 EST Ventricular Rate:  50 PR Interval:    QRS Duration: 91 QT Interval:  409 QTC Calculation: 373 R Axis:   81 Text Interpretation: Sinus rhythm Prolonged PR interval Borderline right axis deviation No significant change since last tracing Confirmed by Marily Memos [id number]) on 09/30/2019 11:46:05 PM   Radiology DG Chest 1 View  Result Date: 09/30/2019 CLINICAL DATA:  Fall and pain EXAM: CHEST  1 VIEW COMPARISON:  None. FINDINGS: The heart size and mediastinal contours are within normal limits. Overlying median sternotomy wires. Aortic knob calcifications. Both lungs are clear. The visualized skeletal structures are unremarkable. IMPRESSION: No active disease. Electronically Signed   By: Jonna Clark M.D.   On: 09/30/2019 23:44   DG Knee Complete 4 Views Left  Result Date: 09/30/2019 CLINICAL DATA:  Fall and pain EXAM: LEFT KNEE - COMPLETE 4+ VIEW COMPARISON:  None. FINDINGS: No evidence of fracture, dislocation, or joint effusion. 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 Dense vascular calcifications are seen. Surgical clips in the posterior knee. IMPRESSION: No acute osseous abnormality. Electronically Signed   By: Jonna Clark M.D.   On: 09/30/2019 23:44   DG Hip Unilat W or Wo Pelvis 2-3 Views Left  Result Date: 09/30/2019 CLINICAL DATA:  Fall and pain EXAM: DG HIP (WITH OR WITHOUT PELVIS) 2-3V LEFT COMPARISON:  None. FINDINGS: There is a nondisplaced slightly impacted obliquely oriented left femoral neck fracture. The femoral head still well seated within the acetabulum. 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 Diffuse osteopenia. Degenerative changes  in the lower lumbar spine. IMPRESSION: Nondisplaced obliquely oriented left femoral neck fracture. Electronically Signed   By: Jonna Clark M.D.   On: 09/30/2019 23:43   DG Femur Min 2 Views Left  Result Date: 09/30/2019 CLINICAL DATA:  Status post fall. EXAM: LEFT FEMUR 2 VIEWS COMPARISON:  None. FINDINGS: Acute nondisplaced fracture is seen extending through the neck of the proximal left femur. There is no evidence of an associated dislocation. Mild vascular calcification is noted. IMPRESSION: Acute fracture of the proximal left femur. Electronically Signed   By: Aram Candela M.D.   On: 09/30/2019 23:40    Procedures Procedures (including critical care time)  Medications Ordered in ED Medications  fentaNYL (SUBLIMAZE) injection 50 mcg (50 mcg Intravenous Given 09/30/19 2243)  ondansetron (ZOFRAN) injection 4 mg (4 mg Intravenous Given 10/01/19 0012)    ED Course  I have reviewed the triage vital signs and the nursing notes.  Pertinent labs & imaging results that were available during my care of the patient were reviewed by me and considered in my medical decision making (see chart for details).  Clinical Course as of Oct 01 111  Mon Oct 01, 2019  0045 Spoke with the patient's wife, Dois Davenport, to provide an update on his ER course.  She reports that the patient received his second Covid vaccine on 2/4.    [MM]  0059 Patient recheck.  He is satting at 88% with good waveform while he is sleeping in the room.  Oxygen  increases to 94% once he is awake and remains constant in the mid to high 90s.  He denies a history of OSA.  We will place the patient on 2 L nasal cannula while he is sleeping.  His nausea has resolved.  He continues to deny headache.  He has no complaints at this time.   [MM]    Clinical Course User Index [MM] Kylene Zamarron, Coral Else, PA-C   MDM Rules/Calculators/A&P                      79 year old male with a history of a AAA, HLD, HTN, lumbar microdiscectomy, CAD, and COPD  presenting by [facility] after a mechanical fall. The patient slipped in his kitchen on water in the floor and fell onto his left leg. He is endorsing maximal pain in the left knee, but additionally has pain on the left thigh and hip. He was given 10 mg of morphine in route with EMS. He has no somnolence and is not hypoxic, but on exam he can tolerate no range of motion of the left knee. Will obtain images of the left knee, femur, and hip. Although he has had a significant amount of morphine, I have concerns that the patient will not be able to tolerate imaging with his current pain level. Will order a 50 mg IV dose of fentanyl prior to imaging.  X-ray of the pelvis with a nondisplaced obliquely oriented left femoral neck fracture.  X-ray of the left femur also demonstrates an acute fracture of the proximal left femur, but on my review the fracture is referencing the previously seen femoral neck fracture.  No other fractures noted of the proximal left femur on my review.  X-ray of the left knee is unremarkable.  The patient was seen and independently evaluated by Dr. Clydia Llano, attending physician.  Spoke with Dr. Jena Gauss with orthopedic surgery who recommends medical admission given the patient's chronic medical conditions.  We will plan for OR repair either tomorrow or in 2 days.  Ortho surgery will follow the patient.  Consult appreciated.  On reevaluation, the patient is now endorsing nausea that began while he was in x-ray.  Suspect this is secondary to pain medication as he had no nausea on my initial evaluation.  He has no headache.  He has no neurologic complaints.  He was given Zofran and on reassessment nausea had improved.  Doubt intracranial injury given unremarkable neurologic exam and no complaints.  After obtaining the patient's wife, he was sleeping when I reentered the room with O2 sats of 88%.  This resolved immediately upon awakening.  Question undiagnosed OSA.  Will place the  patient on 2 L nasal cannula while he is sleeping.  He has no shortness of breath at this time.  Preoperative labs and imaging obtained.  Chest x-ray is unremarkable.  COVID-19 test is negative.  He is anemic with a hemoglobin of 12.4.  No recent labs available for comparison.  We will continue to monitor.  He is having no active bleeding.  He does also have a contusion to the left forearm and superficial abrasion.  His Tdap is up-to-date.  On evaluation of the left upper extremity, he has no focal tenderness and full range of motion of both the elbow, shoulder, and wrist are intact.  Will defer imaging at this time.  Consult to the hospitalist team and Dr. Toniann Fail will accept the patient for admission. The patient appears reasonably stabilized for admission considering  the current resources, flow, and capabilities available in the ED at this time, and I doubt any other [facility] requiring further screening and/or treatment in the ED prior to admission.  Final Clinical Impression(s) / ED Diagnoses Final diagnoses:  Closed fracture of neck of left femur, initial encounter [facility])    Rx / DC Orders ED Discharge Orders    None       Juan Kissoon A, PA-C 10/01/19 0113    Derwood Kaplan, MD 10/01/19 1647

## 2019-09-30 NOTE — ED Triage Notes (Signed)
Pt comes via [facility], had a fall in his kitchen after slipping on water, fell onto L knee, PTA received 10 mg of morphine, no deformity noted. Did not hit head, no LOC

## 2019-10-01 ENCOUNTER — Encounter (HOSPITAL_COMMUNITY): Payer: Self-pay | Admitting: Internal Medicine

## 2019-10-01 ENCOUNTER — Inpatient Hospital Stay (HOSPITAL_COMMUNITY): Payer: Medicare Other

## 2019-10-01 ENCOUNTER — Inpatient Hospital Stay (HOSPITAL_COMMUNITY): Payer: Medicare Other | Admitting: Anesthesiology

## 2019-10-01 ENCOUNTER — Encounter (HOSPITAL_COMMUNITY)
Admission: EM | Disposition: A | Discharge status: Home Health | Payer: Self-pay | Source: Home / Self Care | Attending: Family Medicine

## 2019-10-01 DIAGNOSIS — Y92010 Kitchen of single-family (private) house as the place of occurrence of the external cause: Secondary | ICD-10-CM | POA: Diagnosis not present

## 2019-10-01 DIAGNOSIS — Z20822 Contact with and (suspected) exposure to covid-19: Secondary | ICD-10-CM | POA: Diagnosis present

## 2019-10-01 DIAGNOSIS — Z801 Family history of malignant neoplasm of trachea, bronchus and lung: Secondary | ICD-10-CM | POA: Diagnosis not present

## 2019-10-01 DIAGNOSIS — I119 Hypertensive heart disease without heart failure: Secondary | ICD-10-CM | POA: Diagnosis present

## 2019-10-01 DIAGNOSIS — S72002A Fracture of unspecified part of neck of left femur, initial encounter for closed fracture: Secondary | ICD-10-CM | POA: Diagnosis present

## 2019-10-01 DIAGNOSIS — S72009A Fracture of unspecified part of neck of unspecified femur, initial encounter for closed fracture: Secondary | ICD-10-CM | POA: Diagnosis present

## 2019-10-01 DIAGNOSIS — Z951 Presence of aortocoronary bypass graft: Secondary | ICD-10-CM | POA: Diagnosis not present

## 2019-10-01 DIAGNOSIS — K219 Gastro-esophageal reflux disease without esophagitis: Secondary | ICD-10-CM | POA: Diagnosis present

## 2019-10-01 DIAGNOSIS — I739 Peripheral vascular disease, unspecified: Secondary | ICD-10-CM | POA: Diagnosis present

## 2019-10-01 DIAGNOSIS — J449 Chronic obstructive pulmonary disease, unspecified: Secondary | ICD-10-CM | POA: Diagnosis present

## 2019-10-01 DIAGNOSIS — Z880 Allergy status to penicillin: Secondary | ICD-10-CM | POA: Diagnosis not present

## 2019-10-01 DIAGNOSIS — Z87891 Personal history of nicotine dependence: Secondary | ICD-10-CM | POA: Diagnosis not present

## 2019-10-01 DIAGNOSIS — Z79899 Other long term (current) drug therapy: Secondary | ICD-10-CM | POA: Diagnosis not present

## 2019-10-01 DIAGNOSIS — W010XXA Fall on same level from slipping, tripping and stumbling without subsequent striking against object, initial encounter: Secondary | ICD-10-CM | POA: Diagnosis present

## 2019-10-01 DIAGNOSIS — Z7982 Long term (current) use of aspirin: Secondary | ICD-10-CM | POA: Diagnosis not present

## 2019-10-01 DIAGNOSIS — Z91040 Latex allergy status: Secondary | ICD-10-CM | POA: Diagnosis not present

## 2019-10-01 DIAGNOSIS — Z820 Family history of epilepsy and other diseases of the nervous system: Secondary | ICD-10-CM | POA: Diagnosis not present

## 2019-10-01 DIAGNOSIS — I714 Abdominal aortic aneurysm, without rupture: Secondary | ICD-10-CM | POA: Diagnosis present

## 2019-10-01 DIAGNOSIS — Z886 Allergy status to analgesic agent status: Secondary | ICD-10-CM | POA: Diagnosis not present

## 2019-10-01 DIAGNOSIS — R339 Retention of urine, unspecified: Secondary | ICD-10-CM | POA: Diagnosis not present

## 2019-10-01 DIAGNOSIS — Z888 Allergy status to other drugs, medicaments and biological substances status: Secondary | ICD-10-CM | POA: Diagnosis not present

## 2019-10-01 DIAGNOSIS — Z882 Allergy status to sulfonamides status: Secondary | ICD-10-CM | POA: Diagnosis not present

## 2019-10-01 DIAGNOSIS — Z885 Allergy status to narcotic agent status: Secondary | ICD-10-CM | POA: Diagnosis not present

## 2019-10-01 DIAGNOSIS — I2510 Atherosclerotic heart disease of native coronary artery without angina pectoris: Secondary | ICD-10-CM | POA: Diagnosis present

## 2019-10-01 DIAGNOSIS — E785 Hyperlipidemia, unspecified: Secondary | ICD-10-CM | POA: Diagnosis present

## 2019-10-01 HISTORY — PX: HIP PINNING,CANNULATED: SHX1758

## 2019-10-01 HISTORY — DX: Fracture of unspecified part of neck of left femur, initial encounter for closed fracture: S72.002A

## 2019-10-01 LAB — SURGICAL PCR SCREEN
MRSA, PCR: NEGATIVE
Staphylococcus aureus: POSITIVE — AB

## 2019-10-01 LAB — CBC WITH DIFFERENTIAL/PLATELET
Abs Immature Granulocytes: 0.03 10*3/uL (ref 0.00–0.07)
Abs Immature Granulocytes: 0.03 10*3/uL (ref 0.00–0.07)
Basophils Absolute: 0 10*3/uL (ref 0.0–0.1)
Basophils Absolute: 0 10*3/uL (ref 0.0–0.1)
Basophils Relative: 0 %
Basophils Relative: 1 %
Eosinophils Absolute: 0.1 10*3/uL (ref 0.0–0.5)
Eosinophils Absolute: 0.5 10*3/uL (ref 0.0–0.5)
Eosinophils Relative: 1 %
Eosinophils Relative: 6 %
HCT: 37.9 % — ABNORMAL LOW (ref 39.0–52.0)
HCT: 38.3 % — ABNORMAL LOW (ref 39.0–52.0)
Hemoglobin: 12.4 g/dL — ABNORMAL LOW (ref 13.0–17.0)
Hemoglobin: 12.7 g/dL — ABNORMAL LOW (ref 13.0–17.0)
Immature Granulocytes: 0 %
Immature Granulocytes: 0 %
Lymphocytes Relative: 11 %
Lymphocytes Relative: 19 %
Lymphs Abs: 1 10*3/uL (ref 0.7–4.0)
Lymphs Abs: 1.5 10*3/uL (ref 0.7–4.0)
MCH: 31.6 pg (ref 26.0–34.0)
MCH: 31.7 pg (ref 26.0–34.0)
MCHC: 32.7 g/dL (ref 30.0–36.0)
MCHC: 33.2 g/dL (ref 30.0–36.0)
MCV: 95.5 fL (ref 80.0–100.0)
MCV: 96.7 fL (ref 80.0–100.0)
Monocytes Absolute: 0.6 10*3/uL (ref 0.1–1.0)
Monocytes Absolute: 0.7 10*3/uL (ref 0.1–1.0)
Monocytes Relative: 8 %
Monocytes Relative: 8 %
Neutro Abs: 5.1 10*3/uL (ref 1.7–7.7)
Neutro Abs: 6.8 10*3/uL (ref 1.7–7.7)
Neutrophils Relative %: 66 %
Neutrophils Relative %: 80 %
Platelets: 184 10*3/uL (ref 150–400)
Platelets: 188 10*3/uL (ref 150–400)
RBC: 3.92 MIL/uL — ABNORMAL LOW (ref 4.22–5.81)
RBC: 4.01 MIL/uL — ABNORMAL LOW (ref 4.22–5.81)
RDW: 13.4 % (ref 11.5–15.5)
RDW: 13.5 % (ref 11.5–15.5)
WBC: 7.7 10*3/uL (ref 4.0–10.5)
WBC: 8.5 10*3/uL (ref 4.0–10.5)
nRBC: 0 % (ref 0.0–0.2)
nRBC: 0 % (ref 0.0–0.2)

## 2019-10-01 LAB — COMPREHENSIVE METABOLIC PANEL
ALT: 21 U/L (ref 0–44)
AST: 21 U/L (ref 15–41)
Albumin: 3.3 g/dL — ABNORMAL LOW (ref 3.5–5.0)
Alkaline Phosphatase: 62 U/L (ref 38–126)
Anion gap: 10 (ref 5–15)
BUN: 19 mg/dL (ref 8–23)
CO2: 25 mmol/L (ref 22–32)
Calcium: 8.8 mg/dL — ABNORMAL LOW (ref 8.9–10.3)
Chloride: 103 mmol/L (ref 98–111)
Creatinine, Ser: 0.84 mg/dL (ref 0.61–1.24)
GFR calc Af Amer: >60 mL/min (ref >60)
GFR calc non Af Amer: >60 mL/min (ref >60)
Glucose, Bld: 122 mg/dL — ABNORMAL HIGH (ref 70–99)
Potassium: 4.4 mmol/L (ref 3.5–5.1)
Sodium: 138 mmol/L (ref 135–145)
Total Bilirubin: 0.8 mg/dL (ref 0.3–1.2)
Total Protein: 6.8 g/dL (ref 6.5–8.1)

## 2019-10-01 LAB — BASIC METABOLIC PANEL
Anion gap: 11 (ref 5–15)
BUN: 20 mg/dL (ref 8–23)
CO2: 23 mmol/L (ref 22–32)
Calcium: 8.8 mg/dL — ABNORMAL LOW (ref 8.9–10.3)
Chloride: 104 mmol/L (ref 98–111)
Creatinine, Ser: 0.9 mg/dL (ref 0.61–1.24)
GFR calc Af Amer: >60 mL/min (ref >60)
GFR calc non Af Amer: >60 mL/min (ref >60)
Glucose, Bld: 128 mg/dL — ABNORMAL HIGH (ref 70–99)
Potassium: 4.4 mmol/L (ref 3.5–5.1)
Sodium: 138 mmol/L (ref 135–145)

## 2019-10-01 LAB — TYPE AND SCREEN
ABO/RH(D): O POS
Antibody Screen: NEGATIVE

## 2019-10-01 LAB — RESPIRATORY PANEL BY RT PCR (FLU A&B, COVID)
Influenza A by PCR: NEGATIVE
Influenza B by PCR: NEGATIVE
SARS Coronavirus 2 by RT PCR: NEGATIVE

## 2019-10-01 LAB — ABO/RH: ABO/RH(D): O POS

## 2019-10-01 LAB — PROTIME-INR
INR: 1 (ref 0.8–1.2)
Prothrombin Time: 13.5 seconds (ref 11.4–15.2)

## 2019-10-01 LAB — VITAMIN D 25 HYDROXY (VIT D DEFICIENCY, FRACTURES): Vit D, 25-Hydroxy: 22.74 ng/mL — ABNORMAL LOW (ref 30–100)

## 2019-10-01 LAB — TROPONIN I (HIGH SENSITIVITY): Troponin I (High Sensitivity): 7 ng/L (ref <18)

## 2019-10-01 LAB — TSH: TSH: 1.974 u[IU]/mL (ref 0.350–4.500)

## 2019-10-01 SURGERY — FIXATION, FEMUR, NECK, PERCUTANEOUS, USING SCREW
Anesthesia: General | Site: Hip | Laterality: Left

## 2019-10-01 MED ORDER — ALBUTEROL SULFATE (2.5 MG/3ML) 0.083% IN NEBU: 2.5000 mg | INHALATION_SOLUTION | RESPIRATORY_TRACT | Status: DC | PRN

## 2019-10-01 MED ORDER — FENTANYL CITRATE (PF) 100 MCG/2ML IJ SOLN
INTRAMUSCULAR | Status: DC | PRN
  Administered 2019-10-01: 100 ug via INTRAVENOUS

## 2019-10-01 MED ORDER — CHLORHEXIDINE GLUCONATE 4 % EX LIQD
60.0000 mL | Freq: Once | CUTANEOUS | Status: AC
  Administered 2019-10-01: 4 via TOPICAL
  Filled 2019-10-01: qty 15

## 2019-10-01 MED ORDER — ADULT MULTIVITAMIN W/MINERALS CH
1.0000 | ORAL_TABLET | Freq: Every day | ORAL | Status: DC
  Administered 2019-10-02 – 2019-10-03 (×2): 1 via ORAL
  Filled 2019-10-01 (×2): qty 1

## 2019-10-01 MED ORDER — ENOXAPARIN SODIUM 40 MG/0.4ML ~~LOC~~ SOLN
40.0000 mg | SUBCUTANEOUS | Status: DC
  Administered 2019-10-02 – 2019-10-03 (×2): 40 mg via SUBCUTANEOUS
  Filled 2019-10-01 (×2): qty 0.4

## 2019-10-01 MED ORDER — ONDANSETRON HCL 4 MG/2ML IJ SOLN
INTRAMUSCULAR | Status: DC | PRN
  Administered 2019-10-01: 4 mg via INTRAVENOUS

## 2019-10-01 MED ORDER — ONDANSETRON HCL 4 MG/2ML IJ SOLN
INTRAMUSCULAR | Status: AC
  Filled 2019-10-01: qty 6

## 2019-10-01 MED ORDER — CEFAZOLIN SODIUM-DEXTROSE 2-4 GM/100ML-% IV SOLN
2.0000 g | INTRAVENOUS | Status: AC
  Administered 2019-10-01: 12:00:00 2 g via INTRAVENOUS
  Filled 2019-10-01: qty 100

## 2019-10-01 MED ORDER — CEFAZOLIN SODIUM 1 G IJ SOLR
INTRAMUSCULAR | Status: AC
  Filled 2019-10-01: qty 20

## 2019-10-01 MED ORDER — LIDOCAINE 2% (20 MG/ML) 5 ML SYRINGE
INTRAMUSCULAR | Status: AC
  Filled 2019-10-01: qty 15

## 2019-10-01 MED ORDER — ENSURE PRE-SURGERY PO LIQD
296.0000 mL | Freq: Once | ORAL | Status: DC
  Filled 2019-10-01: qty 296

## 2019-10-01 MED ORDER — ALBUTEROL SULFATE (2.5 MG/3ML) 0.083% IN NEBU: 2.5000 mg | INHALATION_SOLUTION | Freq: Four times a day (QID) | RESPIRATORY_TRACT | Status: DC | PRN

## 2019-10-01 MED ORDER — ONDANSETRON HCL 4 MG/2ML IJ SOLN
4.0000 mg | Freq: Four times a day (QID) | INTRAMUSCULAR | Status: DC | PRN
  Administered 2019-10-01 (×2): 4 mg via INTRAVENOUS
  Filled 2019-10-01 (×3): qty 2

## 2019-10-01 MED ORDER — ROCURONIUM BROMIDE 10 MG/ML (PF) SYRINGE
PREFILLED_SYRINGE | INTRAVENOUS | Status: AC
  Filled 2019-10-01: qty 30

## 2019-10-01 MED ORDER — HYDROMORPHONE HCL 1 MG/ML IJ SOLN
INTRAMUSCULAR | Status: AC
  Filled 2019-10-01: qty 1

## 2019-10-01 MED ORDER — LABETALOL HCL 5 MG/ML IV SOLN: 5.0000 mg | INTRAVENOUS | Status: DC | PRN

## 2019-10-01 MED ORDER — ENSURE ENLIVE PO LIQD
237.0000 mL | Freq: Two times a day (BID) | ORAL | Status: DC
  Administered 2019-10-02 – 2019-10-03 (×4): 237 mL via ORAL

## 2019-10-01 MED ORDER — DEXAMETHASONE SODIUM PHOSPHATE 10 MG/ML IJ SOLN
INTRAMUSCULAR | Status: AC
  Filled 2019-10-01: qty 3

## 2019-10-01 MED ORDER — DEXAMETHASONE SODIUM PHOSPHATE 10 MG/ML IJ SOLN
INTRAMUSCULAR | Status: DC | PRN
  Administered 2019-10-01: 5 mg via INTRAVENOUS

## 2019-10-01 MED ORDER — ROCURONIUM BROMIDE 10 MG/ML (PF) SYRINGE
PREFILLED_SYRINGE | INTRAVENOUS | Status: DC | PRN
  Administered 2019-10-01: 6 mg via INTRAVENOUS

## 2019-10-01 MED ORDER — POTASSIUM CHLORIDE IN NACL 20-0.9 MEQ/L-% IV SOLN
INTRAVENOUS | Status: DC
  Filled 2019-10-01: qty 1000

## 2019-10-01 MED ORDER — 0.9 % SODIUM CHLORIDE (POUR BTL) OPTIME
TOPICAL | Status: DC | PRN
  Administered 2019-10-01: 1000 mL

## 2019-10-01 MED ORDER — LIDOCAINE 2% (20 MG/ML) 5 ML SYRINGE
INTRAMUSCULAR | Status: DC | PRN
  Administered 2019-10-01: 80 mg via INTRAVENOUS

## 2019-10-01 MED ORDER — FENTANYL CITRATE (PF) 250 MCG/5ML IJ SOLN
INTRAMUSCULAR | Status: AC
  Filled 2019-10-01: qty 5

## 2019-10-01 MED ORDER — ALBUTEROL SULFATE (2.5 MG/3ML) 0.083% IN NEBU
2.5000 mg | INHALATION_SOLUTION | Freq: Once | RESPIRATORY_TRACT | Status: AC
  Administered 2019-10-01: 14:00:00 2.5 mg via RESPIRATORY_TRACT

## 2019-10-01 MED ORDER — CEFAZOLIN SODIUM-DEXTROSE 2-4 GM/100ML-% IV SOLN
2.0000 g | Freq: Three times a day (TID) | INTRAVENOUS | Status: AC
  Administered 2019-10-01 – 2019-10-02 (×3): 2 g via INTRAVENOUS
  Filled 2019-10-01 (×4): qty 100

## 2019-10-01 MED ORDER — FENTANYL CITRATE (PF) 100 MCG/2ML IJ SOLN
25.0000 ug | INTRAMUSCULAR | Status: DC | PRN
  Administered 2019-10-01 (×3): 25 ug via INTRAVENOUS
  Filled 2019-10-01 (×3): qty 2

## 2019-10-01 MED ORDER — POVIDONE-IODINE 10 % EX SWAB: 2.0000 "application " | Freq: Once | CUTANEOUS | Status: DC

## 2019-10-01 MED ORDER — ACETAMINOPHEN 500 MG PO TABS
500.0000 mg | ORAL_TABLET | Freq: Two times a day (BID) | ORAL | Status: DC
  Administered 2019-10-01 – 2019-10-03 (×5): 500 mg via ORAL
  Filled 2019-10-01 (×5): qty 1

## 2019-10-01 MED ORDER — PROMETHAZINE HCL 25 MG/ML IJ SOLN: 6.2500 mg | INTRAMUSCULAR | Status: DC | PRN

## 2019-10-01 MED ORDER — ALBUTEROL SULFATE (2.5 MG/3ML) 0.083% IN NEBU
INHALATION_SOLUTION | RESPIRATORY_TRACT | Status: AC
  Filled 2019-10-01: qty 3

## 2019-10-01 MED ORDER — HYDROMORPHONE HCL 1 MG/ML IJ SOLN
0.2500 mg | INTRAMUSCULAR | Status: DC | PRN
  Administered 2019-10-01: 0.25 mg via INTRAVENOUS
  Administered 2019-10-01: 0.5 mg via INTRAVENOUS
  Administered 2019-10-01: 0.25 mg via INTRAVENOUS

## 2019-10-01 MED ORDER — OXYCODONE-ACETAMINOPHEN 5-325 MG PO TABS
1.0000 | ORAL_TABLET | ORAL | Status: DC | PRN
  Administered 2019-10-02 – 2019-10-03 (×5): 1 via ORAL
  Filled 2019-10-01 (×5): qty 1

## 2019-10-01 MED ORDER — MUPIROCIN 2 % EX OINT
TOPICAL_OINTMENT | CUTANEOUS | Status: AC
  Administered 2019-10-01: 1 via TOPICAL
  Filled 2019-10-01: qty 22

## 2019-10-01 MED ORDER — SUGAMMADEX SODIUM 200 MG/2ML IV SOLN
INTRAVENOUS | Status: DC | PRN
  Administered 2019-10-01: 200 mg via INTRAVENOUS

## 2019-10-01 MED ORDER — ALBUTEROL SULFATE HFA 108 (90 BASE) MCG/ACT IN AERS
INHALATION_SPRAY | RESPIRATORY_TRACT | Status: AC
  Filled 2019-10-01: qty 6.7

## 2019-10-01 MED ORDER — LISINOPRIL 10 MG PO TABS
10.0000 mg | ORAL_TABLET | Freq: Every day | ORAL | Status: DC
  Administered 2019-10-01 – 2019-10-03 (×3): 10 mg via ORAL
  Filled 2019-10-01 (×3): qty 1

## 2019-10-01 MED ORDER — CEFAZOLIN SODIUM-DEXTROSE 2-4 GM/100ML-% IV SOLN
2.0000 g | Freq: Three times a day (TID) | INTRAVENOUS | Status: DC
  Filled 2019-10-01 (×3): qty 100

## 2019-10-01 MED ORDER — MUPIROCIN 2 % EX OINT
1.0000 "application " | TOPICAL_OINTMENT | Freq: Two times a day (BID) | CUTANEOUS | Status: DC
  Administered 2019-10-01 – 2019-10-03 (×4): 1 via TOPICAL
  Filled 2019-10-01: qty 22

## 2019-10-01 MED ORDER — PROPOFOL 10 MG/ML IV BOLUS
INTRAVENOUS | Status: DC | PRN
  Administered 2019-10-01: 120 mg via INTRAVENOUS

## 2019-10-01 MED ORDER — VANCOMYCIN HCL 1000 MG IV SOLR
INTRAVENOUS | Status: DC | PRN
  Administered 2019-10-01: 1000 mg

## 2019-10-01 MED ORDER — VANCOMYCIN HCL 1000 MG IV SOLR
INTRAVENOUS | Status: AC
  Filled 2019-10-01: qty 1000

## 2019-10-01 MED ORDER — EPHEDRINE SULFATE 50 MG/ML IJ SOLN
INTRAMUSCULAR | Status: DC | PRN
  Administered 2019-10-01 (×2): 10 mg via INTRAVENOUS

## 2019-10-01 MED ORDER — ONDANSETRON HCL 4 MG/2ML IJ SOLN
4.0000 mg | Freq: Once | INTRAMUSCULAR | Status: AC
  Administered 2019-10-01: 4 mg via INTRAVENOUS
  Filled 2019-10-01: qty 2

## 2019-10-01 MED ORDER — EPHEDRINE 5 MG/ML INJ
INTRAVENOUS | Status: AC
  Filled 2019-10-01: qty 10

## 2019-10-01 MED ORDER — MOMETASONE FURO-FORMOTEROL FUM 200-5 MCG/ACT IN AERO
2.0000 | INHALATION_SPRAY | Freq: Two times a day (BID) | RESPIRATORY_TRACT | Status: DC
  Administered 2019-10-01 – 2019-10-03 (×5): 2 via RESPIRATORY_TRACT
  Filled 2019-10-01: qty 8.8

## 2019-10-01 MED ORDER — LACTATED RINGERS IV SOLN: INTRAVENOUS | Status: DC

## 2019-10-01 SURGICAL SUPPLY — 48 items
ADH SKN CLS APL DERMABOND .7 (GAUZE/BANDAGES/DRESSINGS) ×1
APL PRP STRL LF DISP 70% ISPRP (MISCELLANEOUS) ×1
BIT DRILL 5.0 QC 6.5 (BIT) ×1 IMPLANT
BNDG COHESIVE 6X5 TAN STRL LF (GAUZE/BANDAGES/DRESSINGS) ×3 IMPLANT
BRUSH SCRUB EZ PLAIN DRY (MISCELLANEOUS) ×2 IMPLANT
CHLORAPREP W/TINT 26 (MISCELLANEOUS) ×2 IMPLANT
COVER SURGICAL LIGHT HANDLE (MISCELLANEOUS) ×4 IMPLANT
COVER WAND RF STERILE (DRAPES) ×2 IMPLANT
DERMABOND ADVANCED (GAUZE/BANDAGES/DRESSINGS) ×1
DERMABOND ADVANCED .7 DNX12 (GAUZE/BANDAGES/DRESSINGS) ×1 IMPLANT
DRAPE C-ARMOR (DRAPES) ×2 IMPLANT
DRAPE HALF SHEET 40X57 (DRAPES) ×4 IMPLANT
DRAPE IMP U-DRAPE 54X76 (DRAPES) ×4 IMPLANT
DRAPE ORTHO SPLIT 77X108 STRL (DRAPES) ×4
DRAPE STERI IOBAN 125X83 (DRAPES) ×2 IMPLANT
DRAPE SURG 17X23 STRL (DRAPES) ×2 IMPLANT
DRAPE SURG ORHT 6 SPLT 77X108 (DRAPES) ×2 IMPLANT
DRAPE U-SHAPE 47X51 STRL (DRAPES) ×2 IMPLANT
DRSG MEPILEX BORDER 4X4 (GAUZE/BANDAGES/DRESSINGS) ×2 IMPLANT
ELECT REM PT RETURN 9FT ADLT (ELECTROSURGICAL) ×2
ELECTRODE REM PT RTRN 9FT ADLT (ELECTROSURGICAL) ×1 IMPLANT
GLOVE BIO SURGEON STRL SZ 6.5 (GLOVE) ×6 IMPLANT
GLOVE BIO SURGEON STRL SZ7.5 (GLOVE) ×8 IMPLANT
GLOVE BIOGEL PI IND STRL 6.5 (GLOVE) ×1 IMPLANT
GLOVE BIOGEL PI IND STRL 7.5 (GLOVE) ×1 IMPLANT
GLOVE BIOGEL PI INDICATOR 6.5 (GLOVE) ×1
GLOVE BIOGEL PI INDICATOR 7.5 (GLOVE) ×1
GOWN STRL REUS W/ TWL LRG LVL3 (GOWN DISPOSABLE) ×2 IMPLANT
GOWN STRL REUS W/TWL LRG LVL3 (GOWN DISPOSABLE) ×4
KIT BASIN OR (CUSTOM PROCEDURE TRAY) ×2 IMPLANT
KIT TURNOVER KIT B (KITS) ×2 IMPLANT
MANIFOLD NEPTUNE II (INSTRUMENTS) ×2 IMPLANT
NS IRRIG 1000ML POUR BTL (IV SOLUTION) ×2 IMPLANT
PACK GENERAL/GYN (CUSTOM PROCEDURE TRAY) ×2 IMPLANT
PAD ARMBOARD 7.5X6 YLW CONV (MISCELLANEOUS) ×4 IMPLANT
PENCIL SMOKE EVACUATOR (MISCELLANEOUS) ×1 IMPLANT
PIN GUIDE 3.2X300MM (PIN) ×3 IMPLANT
SCREW CANN SS FT 6.5X85 (Screw) ×1 IMPLANT
SCREW CANN SS FT 6.5X90 (Screw) ×2 IMPLANT
STAPLER VISISTAT 35W (STAPLE) ×2 IMPLANT
STOCKINETTE IMPERVIOUS LG (DRAPES) ×3 IMPLANT
SUT MNCRL AB 3-0 PS2 18 (SUTURE) ×3 IMPLANT
SUT VIC AB 2-0 CT1 27 (SUTURE) ×4
SUT VIC AB 2-0 CT1 TAPERPNT 27 (SUTURE) ×1 IMPLANT
TOWEL GREEN STERILE (TOWEL DISPOSABLE) ×4 IMPLANT
TOWEL GREEN STERILE FF (TOWEL DISPOSABLE) ×2 IMPLANT
WASHER CANN 12.7 NS (Orthopedic Implant) ×1 IMPLANT
WATER STERILE IRR 1000ML POUR (IV SOLUTION) ×2 IMPLANT

## 2019-10-01 NOTE — Consult Note (Addendum)
Orthopaedic Trauma Service (OTS) Consult   Patient ID: Thomas [name] MRN: [id number] DOB/AGE: 1940/09/05 79 y.o.  Reason for Consult: Left femoral neck fracture Referring Physician: Frederik Pear, PA-C Patrcia Dolly [place])  HPI: Thomas [name] is an 79 y.o. male with history of CAD status post CABG, hypertension, abdominal aortic aneurysm, COPD, polio being seen in consultation at the request of [facility] EDP for left hip fracture.  Patient was evaluated in [facility] emergency department yesterday afternoon after sustaining a fall at home.  States he was unable to bear weight on the extremity following the incident.  Imaging in the emergency department revealed a left femoral neck fracture.  Orthopedic trauma service consulted, recommending medical admission with plans for surgical fixation of the fracture today.  Patient seen this morning on 6N.  Pain is fairly well controlled at this point.  Notes some soreness in his left shoulder and left knee but denies any pain in the right lower extremity or bilateral upper extremities.  Imaging in the emergency department of the left knee was negative for any fractures or dislocations.  Patient denies hitting his head during the fall.  He denies any numbness or tingling in his extremity.  Patient [place] at home [place].  He has 5 steps to enter his home. Does not use any assistive devices on a regular basis when ambulating, but does have a cane he will occasionally use.  He has a history of a left arthroscopy to the left knee. No other surgery to the left lower extremity. He takes an 81 mg aspirin daily. Uses a long acting as well as a short acting inhaler for his asthma.  Past Medical History:  Diagnosis Date  . AAA (abdominal aortic aneurysm) (HCC)    4.2 cm by CTA 10/01/13- states "much smaller than first expected"Dr. Valentina Lucks follows-checks every 2 yrs  . Allergic asthma   . Allergic rhinitis   . Arthritis    torn  rotator cuff left shoulder  . Bruise    left chest area"recent fall"-no rib fracture identified.  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 Colitis   . GERD (gastroesophageal reflux disease)   . HLD (hyperlipidemia)   . HTN (hypertension)   . Obesity   . Polio    left leg  . Pupil irregularity, right    old injury"pupil will not contract to light"  . Shortness of breath    exertion, asthma attack    Past Surgical History:  Procedure Laterality Date  . CARDIAC CATHETERIZATION     2006- 1 week prior tp CABPG  . CATARACT EXTRACTION    . COLONOSCOPY WITH PROPOFOL N/A 04/27/2016   Procedure: COLONOSCOPY WITH PROPOFOL;  Surgeon: Charolett Bumpers, MD;  Location: [facility];  Service: Endoscopy;  Laterality: N/A;  . CORONARY ARTERY BYPASS GRAFT  3/06   x4 vessel bypass [facility]-No problems since.  Marland Kitchen EYE SURGERY     rebuild eye socket (R)  . KNEE ARTHROSCOPY Left    left knee scope  . LUMBAR DISC SURGERY  2011  . LUMBAR LAMINECTOMY/DECOMPRESSION MICRODISCECTOMY Left 11/07/2013   Procedure: LUMBAR LAMINECTOMY/DECOMPRESSION MICRODISCECTOMY 1 LEVEL lumbar two/three;  Surgeon: Tia Alert, MD;  Location: [facility];  Service: Neurosurgery;  Laterality: Left;  . NASAL SINUS SURGERY      Family History  Problem Relation Age of Onset  . Alzheimer's disease Mother   . Liver disease Sister   .  Lung cancer Brother     Social History:  reports that he quit smoking about 27 years ago. His smoking use included cigarettes. He has a 37.00 pack-year smoking history. He has never used smokeless tobacco. He reports previous alcohol use. He reports that he does not use drugs.  Allergies:  Allergies  Allergen Reactions  . Aspirin Hives  . Codeine Hives  . Fluticasone-Salmeterol Hives  . Montelukast Sodium Hives  . Adhesive [Tape] Rash  . Cephalexin Rash  . Ibuprofen Rash  . Latex Rash  . Penicillins Rash  . Statins Rash  . Sulfonamide Derivatives Rash    Medications: I have  reviewed the patient's current medications.  ROS: Constitutional: No fever or chills Vision: No changes in vision ENT: No difficulty swallowing CV: No chest pain Pulm: No SOB or wheezing GI: No nausea or vomiting GU: No urgency or inability to hold urine Skin: No poor wound healing Neurologic: No numbness or tingling Psychiatric: No depression or anxiety Heme: No bruising Allergic: No reaction to medications or food   Exam: Blood pressure 134/73, pulse (!) 59, temperature (!) 97.3 F (36.3 C), temperature source Oral, resp. rate 18, SpO2 97 %. General: Laying in bed comfortably, no acute distress Orientation: Alert and oriented x3 Mood and Affect: Mood and affect appropriate, pleasant and cooperative Gait: Not assessed due to known fracture Coordination and balance: Within normal limits  Left lower extremity: Minimal bruising noted to the left hip.  Otherwise skin without lesions.  Mild tenderness with palpation over the hip.  Soreness with palpation of the left knee.  No pain with palpation of the lower leg, ankle, or foot.  Ankle dorsiflexion and plantarflexion is intact.  Motor and sensory function intact.  Neurovascularly intact  Right lower extremity: Skin without lesions. No tenderness to palpation. Full painless ROM, full strength in each muscle group without evidence of instability.  Bilateral upper extremities: Superficial abrasion to the left forearm with clean dressing in place.  Otherwise skin without lesions.  No obvious bony deformities.  No tenderness with palpation of bilateral extremities.  Full painless range of motion in all directions, full strength in each muscle group without evidence of instability.  Motor and sensory function is intact.  2+ radial pulse bilaterally.   Medical Decision Making: Data: Imaging: AP pelvis with lateral views of the left hip/femur reveals nondisplaced femoral neck fracture.  Femoral head remains well-seated in the  acetabulum.  Labs:  Results for orders placed or performed during the hospital encounter of 09/30/19 (from the past 24 hour(s))  Type and screen [place]     Status: None   Collection Time: 10/01/19 12:03 AM  Result Value Ref Range   ABO/RH(D) O POS    Antibody Screen NEG    Sample Expiration      10/04/2019,2359 Performed at [facility], [place] [address] [place], [place] [address]   ABO/Rh     Status: None (Preliminary result)   Collection Time: 10/01/19 12:03 AM  Result Value Ref Range   ABO/RH(D)      O POS Performed at [place] [address] [place],  [address]   Basic metabolic panel     Status: Abnormal   Collection Time: 10/01/19 12:05 AM  Result Value Ref Range   Sodium 138 135 - 145 mmol/L   Potassium 4.4 3.5 - 5.1 mmol/L   Chloride 104 98 - 111 mmol/L   CO2 23 22 - 32 mmol/L  Glucose, Bld 128 (H) 70 - 99 mg/dL   BUN 20 8 - 23 mg/dL   Creatinine, Ser 4.40 0.61 - 1.24 mg/dL   Calcium 8.8 (L) 8.9 - 10.3 mg/dL   GFR calc non Af Amer >60 >60 mL/min   GFR calc Af Amer >60 >60 mL/min   Anion gap 11 5 - 15  CBC WITH DIFFERENTIAL     Status: Abnormal   Collection Time: 10/01/19 12:05 AM  Result Value Ref Range   WBC 7.7 4.0 - 10.5 K/uL   RBC 3.92 (L) 4.22 - 5.81 MIL/uL   Hemoglobin 12.4 (L) 13.0 - 17.0 g/dL   HCT 34.7 (L) 42.5 - 95.6 %   MCV 96.7 80.0 - 100.0 fL   MCH 31.6 26.0 - 34.0 pg   MCHC 32.7 30.0 - 36.0 g/dL   RDW 38.7 56.4 - 33.2 %   Platelets 188 150 - 400 K/uL   nRBC 0.0 0.0 - 0.2 %   Neutrophils Relative % 66 %   Neutro Abs 5.1 1.7 - 7.7 K/uL   Lymphocytes Relative 19 %   Lymphs Abs 1.5 0.7 - 4.0 K/uL   Monocytes Relative 8 %   Monocytes Absolute 0.6 0.1 - 1.0 K/uL   Eosinophils Relative 6 %   Eosinophils Absolute 0.5 0.0 - 0.5 K/uL   Basophils Relative 1 %   Basophils Absolute 0.0 0.0 - 0.1 K/uL   Immature Granulocytes 0 %   Abs Immature Granulocytes 0.03 0.00 - 0.07 K/uL  Protime-INR     Status:  None   Collection Time: 10/01/19 12:05 AM  Result Value Ref Range   Prothrombin Time 13.5 11.4 - 15.2 seconds   INR 1.0 0.8 - 1.2  Respiratory Panel by RT PCR (Flu A&B, Covid) - Nasopharyngeal Swab     Status: None   Collection Time: 10/01/19 12:07 AM   Specimen: Nasopharyngeal Swab  Result Value Ref Range   SARS Coronavirus 2 by RT PCR NEGATIVE NEGATIVE   Influenza A by PCR NEGATIVE NEGATIVE   Influenza B by PCR NEGATIVE NEGATIVE     Assessment/Plan: 79 year old male with history of CAD status post CABG, hypertension, abdominal aortic aneurysm, COPD status post fall which resulted in left femoral neck fracture.  Recommend proceeding with percutaneous fixation of the left hip today.  Risks and benefits of the procedure were discussed with the patient. Risks discussed included bleeding, infection, malunion, nonunion, damage to surrounding nerves and blood vessels, pain, hardware prominence or irritation, hardware failure, stiffness, post-traumatic arthritis, DVT/PE, compartment syndrome, and anesthesia complications.  Patient states his understanding of these risks and agrees to proceed with surgery.  All questions were answered, consent was obtained.    New problem w/ workup planned: High complexity diagnosis (Level 5) Surgery w/ risks or Emergency surgery: High complexity Risk (Level 5)  All others are Level 4 with comprehensive musculoskeletal exam.  Elodie Panameno A. Ladonna Snide [facility] [phone] (office) [email or web address]

## 2019-10-01 NOTE — ED Notes (Signed)
Attempted report 

## 2019-10-01 NOTE — Social Work (Signed)
CSW acknowledging consult for SNF placement/HH/DME. Will follow for therapy recommendations needed to best determine disposition/for insurance authorization.   [place], MSW, LCSW [place] [facility]    

## 2019-10-01 NOTE — Progress Notes (Signed)
Received patient from Ed, AOx4, VSS with elevated BP 164/83 due to left hip pain at 7/10 after bed transfer, oriented to room, bed controls, call light and plan of care.  Administered PRN pain medication Fentanyl 25 mcg and PRN nausea medication zofran 4mg  IV, put ice pack on left hip and elevate heels.  Pt now comfortably resting on bed with both eyes close.  Will monitor.

## 2019-10-01 NOTE — Progress Notes (Signed)
Initial Nutrition Assessment  DOCUMENTATION CODES:   Not applicable  INTERVENTION:   -Ensure Enlive po BID, each supplement provides 350 kcal and 20 grams of protein -MVI with minerals daily  NUTRITION DIAGNOSIS:   Increased nutrient needs related to post-op healing as evidenced by estimated needs.  GOAL:   Patient will meet greater than or equal to 90% of their needs  MONITOR:   PO intake, Supplement acceptance, Labs, Weight trends, Skin, I & O's  REASON FOR ASSESSMENT:   Consult Hip fracture protocol  ASSESSMENT:   Thomas [name] is a 79 y.o. male with history of CAD status post CABG, hypertension, abdominal aortic aneurysm, COPD presents to the ER the patient had a fall.  Patient states he tripped and fell over water on the floor.  Denies hitting his head denies any chest pain shortness of breath or palpitation.  Had pain in the left hip area.  Pt admitted with lt femur fx s/p fall.   Reviewed I/O's: 0 ml x 24 hours  Pt NPO for operative fixation today. Pt in OR at time of visit; unable to obtain further nutrition-related history at this time.   Reviewed wt hx; noted pt has experienced a 4.1% wt loss over the past 6 months, which is not significant for time frame.   Pt would greatly benefit from addition of oral nutrition supplements to meet post-operative nutritional needs.   Medications reviewed and include 0.9% NaCl with KCl 20 mEq/L infusion @ 100 ml/hr.   Labs reviewed.   Diet Order:   Diet Order            Diet Heart Room service appropriate? Yes; Fluid consistency: Thin  Diet effective now              EDUCATION NEEDS:   No education needs have been identified at this time  Skin:  Skin Assessment: Reviewed RN Assessment  Last BM:  09/30/19  Height:   Ht Readings from Last 1 Encounters:  10/01/19 5\' 8"  (1.727 m)    Weight:   Wt Readings from Last 1 Encounters:  10/01/19 83.5 kg    Ideal Body Weight:  70 kg  BMI:  Body mass index  is 27.98 kg/m.  Estimated Nutritional Needs:   Kcal:  1900-2100  Protein:  100-115 grams  Fluid:  > 1.9 L    11/29/19, RD, LDN, CDCES Registered Dietitian II Certified Diabetes Care and Education Specialist Please refer to [facility] for RD and/or RD on-call/weekend/after hours pager

## 2019-10-01 NOTE — Anesthesia Procedure Notes (Signed)
Procedure Name: Intubation Date/Time: 10/01/2019 12:13 PM Performed by: Elliot Dally, CRNA Pre-anesthesia Checklist: Patient identified, Emergency Drugs available, Suction available and Patient being monitored Patient Re-evaluated:Patient Re-evaluated prior to induction Oxygen Delivery Method: Circle System Utilized Preoxygenation: Pre-oxygenation with 100% oxygen Induction Type: IV induction Ventilation: Mask ventilation without difficulty and Oral airway inserted - appropriate to patient size Laryngoscope Size: Hyacinth Meeker and 2 Grade View: Grade I Tube type: Oral Tube size: 7.5 mm Number of attempts: 1 Airway Equipment and Method: Stylet and Oral airway Placement Confirmation: ETT inserted through vocal cords under direct vision,  positive ETCO2 and breath sounds checked- equal and bilateral Secured at: 22 cm Tube secured with: Tape Dental Injury: Teeth and Oropharynx as per pre-operative assessment

## 2019-10-01 NOTE — Anesthesia Preprocedure Evaluation (Signed)
Anesthesia Evaluation  Patient identified by MRN, date of birth, ID band Patient awake    Reviewed: Allergy & Precautions, NPO status , Patient's Chart, lab work & pertinent test results  Airway Mallampati: II  TM Distance: >3 FB Neck ROM: Full    Dental no notable dental hx.    Pulmonary COPD, former smoker,    Pulmonary exam normal breath sounds clear to auscultation       Cardiovascular hypertension, + CAD, + CABG and + Peripheral Vascular Disease  Normal cardiovascular exam Rhythm:Regular Rate:Normal     Neuro/Psych negative neurological ROS  negative psych ROS   GI/Hepatic negative GI ROS, Neg liver ROS,   Endo/Other  negative endocrine ROS  Renal/GU negative Renal ROS  negative genitourinary   Musculoskeletal negative musculoskeletal ROS (+)   Abdominal   Peds negative pediatric ROS (+)  Hematology negative hematology ROS (+)   Anesthesia Other Findings   Reproductive/Obstetrics negative OB ROS                             Anesthesia Physical Anesthesia Plan  ASA: III  Anesthesia Plan: General   Post-op Pain Management:    Induction: Intravenous  PONV Risk Score and Plan: 2 and Ondansetron, Dexamethasone and Treatment may vary due to age or medical condition  Airway Management Planned: Oral ETT  Additional Equipment:   Intra-op Plan:   Post-operative Plan: Extubation in OR  Informed Consent: I have reviewed the patients History and Physical, chart, labs and discussed the procedure including the risks, benefits and alternatives for the proposed anesthesia with the patient or authorized representative who has indicated his/her understanding and acceptance.     Dental advisory given  Plan Discussed with: CRNA and Surgeon  Anesthesia Plan Comments:         Anesthesia Quick Evaluation

## 2019-10-01 NOTE — Transfer of Care (Signed)
Immediate Anesthesia Transfer of Care Note  Patient: Thomas [name]  Procedure(s) Performed: CANNULATED HIP PINNING (Left Hip)  Patient Location: PACU  Anesthesia Type:General  Level of Consciousness: awake, alert  and oriented  Airway & Oxygen Therapy: Patient Spontanous Breathing and Patient connected to nasal cannula oxygen  Post-op Assessment: Report given to RN and Post -op Vital signs reviewed and stable  Post vital signs: Reviewed and stable  Last Vitals:  Vitals Value Taken Time  BP 142/72 10/01/19 1315  Temp    Pulse 71 10/01/19 1317  Resp 15 10/01/19 1317  SpO2 99 % 10/01/19 1317  Vitals shown include unvalidated device data.  Last Pain:  Vitals:   10/01/19 1030  TempSrc: Oral  PainSc:       Patients Stated Pain Goal: 3 (10/01/19 1009)  Complications: No apparent anesthesia complications

## 2019-10-01 NOTE — Op Note (Signed)
Orthopaedic Surgery Operative Note (CSN: [id number] ) Date of Surgery: 10/01/2019  Admit Date: 09/30/2019   Diagnoses: Pre-Op Diagnoses: Left femoral neck fracture   Post-Op Diagnosis: Same  Procedures: CPT 27235-Percutaneous fixation of left femoral neck fracture  Surgeons : Primary: Lacrecia Delval, Gillie Manners, MD  Assistant: Ulyses Southward, PA-C  Location: OR 3   Anesthesia:General  Antibiotics: Ancef 2g preop   Tourniquet time: None  Estimated Blood Loss:15 mL  Complications:None   Specimens:None   Implants: Implant Name Type Inv. Item Serial No. Manufacturer Lot No. LRB No. Used Action  WASHER 6.5 - [id number] Orthopedic Implant WASHER 6.5  [employer]  Left 1 Implanted  85X6.5 FT Cann Screw    [employer]  Left 1 Implanted  90 X 6.5 FT Cann Screw    [employer]  Left 2 Implanted     Indications for Surgery: 79 year old male with history of coronary artery disease, hypertension, aortic aneurysm and COPD who presents after a ground-level fall with a nondisplaced a left femoral neck fracture.  I recommended proceeding with percutaneous fixation of the left femoral neck.  Risks and benefits were discussed with the patient.  Risks include but not limited to bleeding, infection, nonunion, malunion, hardware failure, fixation failure, cut out of the screws, progression of hip arthritis, need for total hip arthroplasty, DVT, even the possibility anesthetic complications.  Patient agreed to proceed with surgery and consent was obtained.  Operative Findings: Percutaneous fixation of left femoral neck fracture using [employer] 6.5 mm fully threaded cannulated screws x3  Procedure: The patient was identified in the preoperative holding area. Consent was confirmed with the patient and their family and all questions were answered. The operative extremity was marked after confirmation with the patient they were then brought back to the  operating room by our anesthesia colleagues. They were placed under general anesthesia and carefully transferred to a radiolucent flat top table. A bump was placed under the operative hip and fluoroscopy was used to confirm that the fracture had not displaced. The operative extremity was then prepped and draped in usual sterile fashion. A preoperative timeout was performed to verify the patient, the procedure, and the extremity. Preoperative antibiotics were dosed.  Using fluoroscopy as a guide I marked out an incision. I carried this down through skin and the IT band. I used 2.75mm guidepins to direct up the femoral neck in an inverted triangle. I placed anterior superior guidepin, a posterior superior guidepin and an inferior guidepin.  I measured each of the pins and proceeded to drill the lateral cortex.  I then placed a fully threaded screws x3.  The inferior 1 was placed with a washer.  Excellent fixation was obtained.  Final fluoroscopic images were obtained confirming length on all screws. An approach withdrawal technique was used to make sure all screws were extra-articular. The incision was irrigated and closed with 2-0 vicryl, 3-0 monocryl and dermabond. A dressing was placed. The patient was awoken from anesthesia and taken to the PACU in stable condition.  Post Op Plan/Instructions: Patient may be weightbearing as tolerated to the left lower extremity.  He will receive postoperative Ancef.  He will receive Lovenox for DVT prophylaxis.  We will have him mobilized with physical and Occupational Therapy.  I was present and performed the entire surgery.  Ulyses Southward, PA-C did assist me throughout the case. An assistant was necessary given the difficulty in approach, maintenance of reduction and ability to instrument the  fracture.   Katha Hamming, MD [facility]

## 2019-10-01 NOTE — H&P (Signed)
History and Physical    Thomas [name]:[id number] DOB: 1940-11-19 DOA: 09/30/2019  PCP: Kirby Funk, MD  Patient coming from: Home.  Chief Complaint: Fall.  HPI: Thomas [name] is a 79 y.o. male with history of CAD status post CABG, hypertension, abdominal aortic aneurysm, COPD presents to the ER the patient had a fall.  Patient states he tripped and fell over water on the floor.  Denies hitting his head denies any chest pain shortness of breath or palpitation.  Had pain in the left hip area.  ED Course: X-rays revealed left hip fracture and on-call orthopedic surgeon Dr. Jena Gauss was consulted.  EKG shows sinus bradycardia initial EKG repeat was showing normal sinus rhythm with heart rate around 67 bpm..  Chest x-ray was unremarkable Covid test was negative.  Labs show hemoglobin 12.4 otherwise largely unremarkable.  Patient admitted for further management of left hip fracture.  Patient was mildly hypoxic after receiving pain relief medication in the ER but became normal saturation after 2 L oxygen.  On exam patient is not in distress.  Review of Systems: As per HPI, rest all negative.   Past Medical History:  Diagnosis Date   AAA (abdominal aortic aneurysm) (HCC)    4.2 cm by CTA 10/01/13- states "much smaller than first expected"Dr. Valentina Lucks follows-checks every 2 yrs   Allergic asthma    Allergic rhinitis    Arthritis    torn rotator cuff left shoulder   Bruise    left chest area"recent fall"-no rib fracture identified.   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   Colitis    GERD (gastroesophageal reflux disease)    HLD (hyperlipidemia)    HTN (hypertension)    Obesity    Polio    left leg   Pupil irregularity, right    old injury"pupil will not contract to light"   Shortness of breath    exertion, asthma attack    Past Surgical History:  Procedure Laterality Date   CARDIAC CATHETERIZATION     2006- 1 week prior tp CABPG   CATARACT  EXTRACTION     COLONOSCOPY WITH PROPOFOL N/A 04/27/2016   Procedure: COLONOSCOPY WITH PROPOFOL;  Surgeon: Charolett Bumpers, MD;  Location: [facility];  Service: Endoscopy;  Laterality: N/A;   CORONARY ARTERY BYPASS GRAFT  3/06   x4 vessel bypass [facility]-No problems since.   EYE SURGERY     rebuild eye socket (R)   KNEE ARTHROSCOPY Left    left knee scope   LUMBAR DISC SURGERY  2011   LUMBAR LAMINECTOMY/DECOMPRESSION MICRODISCECTOMY Left 11/07/2013   Procedure: LUMBAR LAMINECTOMY/DECOMPRESSION MICRODISCECTOMY 1 LEVEL lumbar two/three;  Surgeon: Tia Alert, MD;  Location: [facility];  Service: Neurosurgery;  Laterality: Left;   NASAL SINUS SURGERY       reports that he quit smoking about 27 years ago. His smoking use included cigarettes. He has a 37.00 pack-year smoking history. He has never used smokeless tobacco. He reports previous alcohol use. He reports that he does not use drugs.  Allergies  Allergen Reactions   Aspirin Hives   Codeine Hives   Fluticasone-Salmeterol Hives   Montelukast Sodium Hives   Adhesive [Tape] Rash   Cephalexin Rash   Ibuprofen Rash   Latex Rash   Penicillins Rash   Statins Rash   Sulfonamide Derivatives Rash    Family History  Problem Relation Age of Onset   Alzheimer's disease Mother    Liver disease Sister  Lung cancer Brother     Prior to Admission medications   Medication Sig Start Date End Date Taking? Authorizing Provider  aspirin EC 81 MG tablet Take 81 mg by mouth daily.   Yes [provider]  DULERA 200-5 MCG/ACT AERO Inhale 2 puffs into the lungs 2 (two) times daily. 05/29/19  Yes [provider]  fexofenadine (ALLEGRA) 180 MG tablet Take 180 mg by mouth daily as needed for allergies.    Yes [provider]  lisinopril (PRINIVIL,ZESTRIL) 10 MG tablet Take 10 mg by mouth daily.    Yes [provider]  PROAIR HFA 108 (90 Base) MCG/ACT inhaler Inhale 2 puffs into the lungs every  4 (four) hours as needed for wheezing.  09/19/19  Yes [provider]  albuterol (PROVENTIL) (2.5 MG/3ML) 0.083% nebulizer solution Take 3 mLs (2.5 mg total) by nebulization every 4 (four) hours as needed for wheezing or shortness of breath. Patient not taking: Reported on 10/01/2019 10/07/14   Deneise Lever, MD  Glycopyrrolate-Formoterol (BEVESPI AEROSPHERE) 9-4.8 MCG/ACT AERO Inhale 2 puffs into the lungs 2 (two) times daily. Patient not taking: Reported on 10/01/2019 11/03/16   Deneise Lever, MD  predniSONE (DELTASONE) 5 MG tablet Take 1 tablet (5 mg total) by mouth daily as needed (asthma). Patient not taking: Reported on 10/01/2019 12/10/14   Deneise Lever, MD    Physical Exam: Constitutional: Moderately built and nourished. Vitals:   09/30/19 2300 09/30/19 2330 10/01/19 0015 10/01/19 0100  BP: (!) 158/91 (!) 135/97    Pulse: 72 72 64 (!) 59  Resp:   15 13  Temp:      TempSrc:      SpO2: 95% 96% 95% 96%   Eyes: Anicteric no pallor. ENMT: No discharge from the ears eyes nose or mouth. Neck: No JVD appreciated no mass felt. Respiratory: No rhonchi or crepitations. Cardiovascular: S1-S2 heard. Abdomen: Soft nontender bowel sound present. Musculoskeletal: Pain on moving left hip. Skin: No rash. Neurologic: Alert awake oriented to time place and person.  Moves all extremities. Psychiatric: Appears normal per normal affect.   Labs on Admission: I have personally reviewed following labs and imaging studies  CBC: Recent Labs  Lab 10/01/19 0005  WBC 7.7  NEUTROABS 5.1  HGB 12.4*  HCT 37.9*  MCV 96.7  PLT 053   Basic Metabolic Panel: Recent Labs  Lab 10/01/19 0005  NA 138  K 4.4  CL 104  CO2 23  GLUCOSE 128*  BUN 20  CREATININE 0.90  CALCIUM 8.8*   GFR: CrCl cannot be calculated (Unknown ideal weight.). Liver Function Tests: No results for input(s): AST, ALT, ALKPHOS, BILITOT, PROT, ALBUMIN in the last 168 hours. No results for input(s): LIPASE,  AMYLASE in the last 168 hours. No results for input(s): AMMONIA in the last 168 hours. Coagulation Profile: Recent Labs  Lab 10/01/19 0005  INR 1.0   Cardiac Enzymes: No results for input(s): CKTOTAL, CKMB, CKMBINDEX, TROPONINI in the last 168 hours. BNP (last 3 results) No results for input(s): PROBNP in the last 8760 hours. HbA1C: No results for input(s): HGBA1C in the last 72 hours. CBG: No results for input(s): GLUCAP in the last 168 hours. Lipid Profile: No results for input(s): CHOL, HDL, LDLCALC, TRIG, CHOLHDL, LDLDIRECT in the last 72 hours. Thyroid Function Tests: No results for input(s): TSH, T4TOTAL, FREET4, T3FREE, THYROIDAB in the last 72 hours. Anemia Panel: No results for input(s): VITAMINB12, FOLATE, FERRITIN, TIBC, IRON, RETICCTPCT in the last 72 hours.  Urine analysis:    Component Value Date/Time   COLORURINE YELLOW 03/01/2010 0757   APPEARANCEUR CLEAR 03/01/2010 0757   LABSPEC 1.020 03/01/2010 0757   PHURINE 5.0 03/01/2010 0757   GLUCOSEU NEGATIVE 03/01/2010 0757   HGBUR NEGATIVE 03/01/2010 0757   BILIRUBINUR NEGATIVE 03/01/2010 0757   KETONESUR NEGATIVE 03/01/2010 0757   PROTEINUR NEGATIVE 03/01/2010 0757   UROBILINOGEN 0.2 03/01/2010 0757   NITRITE NEGATIVE 03/01/2010 0757   LEUKOCYTESUR  03/01/2010 0757    NEGATIVE MICROSCOPIC NOT DONE ON URINES WITH NEGATIVE PROTEIN, BLOOD, LEUKOCYTES, NITRITE, OR GLUCOSE <1000 mg/dL.   Sepsis Labs: @LABRCNTIP (procalcitonin:4,lacticidven:4) ) Recent Results (from the past 240 hour(s))  Respiratory Panel by RT PCR (Flu A&B, Covid) - Nasopharyngeal Swab     Status: None   Collection Time: 10/01/19 12:07 AM   Specimen: Nasopharyngeal Swab  Result Value Ref Range Status   SARS Coronavirus 2 by RT PCR NEGATIVE NEGATIVE Final    Comment: (NOTE) SARS-CoV-2 target nucleic acids are NOT DETECTED. The SARS-CoV-2 RNA is generally detectable in upper respiratoy specimens during the acute phase of infection. The  lowest concentration of SARS-CoV-2 viral copies this assay can detect is 131 copies/mL. A negative result does not preclude SARS-Cov-2 infection and should not be used as the sole basis for treatment or other patient management decisions. A negative result may occur with  improper specimen collection/handling, submission of specimen other than nasopharyngeal swab, presence of viral mutation(s) within the areas targeted by this assay, and inadequate number of viral copies (<131 copies/mL). A negative result must be combined with clinical observations, patient history, and epidemiological information. The expected result is Negative. Fact Sheet for Patients:  11/29/19 Fact Sheet for Healthcare Providers:  https://www.moore.com/ This test is not yet ap proved or cleared by the https://www.young.biz/ FDA and  has been authorized for detection and/or diagnosis of SARS-CoV-2 by FDA under an Emergency Use Authorization (EUA). This EUA will remain  in effect (meaning this test can be used) for the duration of the COVID-19 declaration under Section 564(b)(1) of the Act, 21 U.S.C. section 360bbb-3(b)(1), unless the authorization is terminated or revoked sooner.    Influenza A by PCR NEGATIVE NEGATIVE Final   Influenza B by PCR NEGATIVE NEGATIVE Final    Comment: (NOTE) The Xpert Xpress SARS-CoV-2/FLU/RSV assay is intended as an aid in  the diagnosis of influenza from Nasopharyngeal swab specimens and  should not be used as a sole basis for treatment. Nasal washings and  aspirates are unacceptable for Xpert Xpress SARS-CoV-2/FLU/RSV  testing. Fact Sheet for Patients: [place] Fact Sheet for Healthcare Providers: https://www.moore.com/ This test is not yet approved or cleared by the https://www.young.biz/ FDA and  has been authorized for detection and/or diagnosis of SARS-CoV-2 by  FDA under an Emergency  Use Authorization (EUA). This EUA will remain  in effect (meaning this test can be used) for the duration of the  Covid-19 declaration under Section 564(b)(1) of the Act, 21  U.S.C. section 360bbb-3(b)(1), unless the authorization is  terminated or revoked. Performed at [facility], [address], [place], [place]      Radiological Exams on Admission: DG Chest 1 View  Result Date: 09/30/2019 CLINICAL DATA:  Fall and pain EXAM: CHEST  1 VIEW COMPARISON:  None. FINDINGS: The heart size and mediastinal contours are within normal limits. Overlying median sternotomy wires. Aortic knob calcifications. Both lungs are clear. The visualized skeletal structures are unremarkable. IMPRESSION: No active disease. Electronically Signed   By:  Jonna Clark M.D.   On: 09/30/2019 23:44   DG Knee Complete 4 Views Left  Result Date: 09/30/2019 CLINICAL DATA:  Fall and pain EXAM: LEFT KNEE - COMPLETE 4+ VIEW COMPARISON:  None. FINDINGS: No evidence of fracture, dislocation, or joint effusion. 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 Dense vascular calcifications are seen. Surgical clips in the posterior knee. IMPRESSION: No acute osseous abnormality. Electronically Signed   By: Jonna Clark M.D.   On: 09/30/2019 23:44   DG Hip Unilat W or Wo Pelvis 2-3 Views Left  Result Date: 09/30/2019 CLINICAL DATA:  Fall and pain EXAM: DG HIP (WITH OR WITHOUT PELVIS) 2-3V LEFT COMPARISON:  None. FINDINGS: There is a nondisplaced slightly impacted obliquely oriented left femoral neck fracture. The femoral head still well seated within the acetabulum. 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 Diffuse osteopenia. Degenerative changes in the lower lumbar spine. IMPRESSION: Nondisplaced obliquely oriented left femoral neck fracture. Electronically Signed   By: Jonna Clark M.D.   On: 09/30/2019 23:43   DG Femur Min 2 Views Left  Result Date:  09/30/2019 CLINICAL DATA:  Status post fall. EXAM: LEFT FEMUR 2 VIEWS COMPARISON:  None. FINDINGS: Acute nondisplaced fracture is seen extending through the neck of the proximal left femur. There is no evidence of an associated dislocation. Mild vascular calcification is noted. IMPRESSION: Acute fracture of the proximal left femur. Electronically Signed   By: Aram Candela M.D.   On: 09/30/2019 23:40    EKG: Independently reviewed.  Sinus bradycardia EKG repeat 1 was showing normal sinus rhythm.  Assessment/Plan Principal Problem:   Closed left hip fracture, initial encounter (HCC) Active Problems:   Hyperlipidemia   COPD mixed type (HCC)   CAD (coronary artery disease)   Hip fracture (HCC)    1. Left femur fracture status post mechanical fall -on-call orthopedic surgeon Dr. Jena Gauss has been consulted we will keep patient n.p.o. in anticipation of surgery.  Pain relief medication. 2. COPD not actively wheezing continue inhalers as needed and Dulera. 3. CAD denies any chest pain. 4. Hypertension on lisinopril. 5. Abdominal aortic aneurysm denies any abdominal pain. 6. Anemia could be from blood loss follow CBC.  Given the acute hip fracture will need close monitoring for any worsening in inpatient status.   DVT prophylaxis: SCDs for now until surgery. Code Status: Full code. Family Communication: Discussed with patient. Disposition Plan: Home. Consults called: Orthopedics. Admission status: Inpatient.   Eduard Clos MD [facility] Pager [phone].  If 7PM-7AM, please contact night-coverage [email or web address] Password [facility]  10/01/2019, 3:19 AM

## 2019-10-01 NOTE — Progress Notes (Addendum)
Pt has tried several times to void, bladder scan was done earlier and pt had 564cc.  Pt still unable to void any, message sent to Dr Carmelia Roller.  Pt states he has had the problem before due to his prostate, last time once he was cathed he was then able to void on his own. Wait for MD orders.

## 2019-10-01 NOTE — Progress Notes (Signed)
  PROGRESS NOTE  Patient admitted earlier this morning. See H&P.   Reports pain is currently well controlled.  He is to go to the OR today with the Ortho team.  No complaints.  Sharlene Dory, DO [facility] 10/01/2019, 11:58 AM  Available via Epic secure chat 7am-7pm After these hours, please refer to coverage provider listed on [email or web address]

## 2019-10-02 LAB — CBC
HCT: 37.5 % — ABNORMAL LOW (ref 39.0–52.0)
Hemoglobin: 12.6 g/dL — ABNORMAL LOW (ref 13.0–17.0)
MCH: 31.7 pg (ref 26.0–34.0)
MCHC: 33.6 g/dL (ref 30.0–36.0)
MCV: 94.2 fL (ref 80.0–100.0)
Platelets: 183 10*3/uL (ref 150–400)
RBC: 3.98 MIL/uL — ABNORMAL LOW (ref 4.22–5.81)
RDW: 13.2 % (ref 11.5–15.5)
WBC: 6.9 10*3/uL (ref 4.0–10.5)
nRBC: 0 % (ref 0.0–0.2)

## 2019-10-02 LAB — BASIC METABOLIC PANEL
Anion gap: 12 (ref 5–15)
BUN: 17 mg/dL (ref 8–23)
CO2: 23 mmol/L (ref 22–32)
Calcium: 8.8 mg/dL — ABNORMAL LOW (ref 8.9–10.3)
Chloride: 101 mmol/L (ref 98–111)
Creatinine, Ser: 0.94 mg/dL (ref 0.61–1.24)
GFR calc Af Amer: >60 mL/min (ref >60)
GFR calc non Af Amer: >60 mL/min (ref >60)
Glucose, Bld: 134 mg/dL — ABNORMAL HIGH (ref 70–99)
Potassium: 4 mmol/L (ref 3.5–5.1)
Sodium: 136 mmol/L (ref 135–145)

## 2019-10-02 MED ORDER — ENOXAPARIN SODIUM 40 MG/0.4ML ~~LOC~~ SOLN: 40.0000 mg | SUBCUTANEOUS | 0 refills | Status: DC

## 2019-10-02 MED ORDER — OXYCODONE-ACETAMINOPHEN 5-325 MG PO TABS: 1.0000 | ORAL_TABLET | ORAL | 0 refills | Status: DC | PRN

## 2019-10-02 MED ORDER — VITAMIN D 25 MCG (1000 UNIT) PO TABS
2000.0000 [IU] | ORAL_TABLET | Freq: Two times a day (BID) | ORAL | Status: DC
  Administered 2019-10-02 – 2019-10-03 (×3): 2000 [IU] via ORAL
  Filled 2019-10-02 (×3): qty 2

## 2019-10-02 NOTE — Anesthesia Postprocedure Evaluation (Signed)
Anesthesia Post Note  Patient: Thomas [name]  Procedure(s) Performed: CANNULATED HIP PINNING (Left Hip)     Patient location during evaluation: PACU Anesthesia Type: General Level of consciousness: awake and alert Pain management: pain level controlled Vital Signs Assessment: post-procedure vital signs reviewed and stable Respiratory status: spontaneous breathing, nonlabored ventilation, respiratory function stable and patient connected to nasal cannula oxygen Cardiovascular status: blood pressure returned to baseline and stable Postop Assessment: no apparent nausea or vomiting Anesthetic complications: no    Last Vitals:  Vitals:   10/02/19 0508 10/02/19 0740  BP: 128/61 (!) 142/76  Pulse: 65 66  Resp: 18 18  Temp: 36.4 C 36.7 C  SpO2: 94% 95%    Last Pain:  Vitals:   10/02/19 0740  TempSrc: Oral  PainSc:                  Savana Spina S

## 2019-10-02 NOTE — Progress Notes (Signed)
Patient was able to void with no problems and had 400 cc of output this morning.

## 2019-10-02 NOTE — Discharge Instructions (Addendum)
Orthopaedic Trauma Service Discharge Instructions   General Discharge Instructions  WEIGHT BEARING STATUS: Weightbearing as tolerated on left leg  RANGE OF MOTION/ACTIVITY: Okay for unrestricted hip and knee motion  Wound Care: Incisions can be left open to air if there is no drainage. If incision continues to have drainage, follow wound care instructions below. Okay to shower if no drainage from incisions.  DVT/PE prophylaxis: Lovenox x 30 days  Diet: as you were eating previously.  Can use over the counter stool softeners and bowel preparations, such as Miralax, to help with bowel movements.  Narcotics can be constipating.  Be sure to drink plenty of fluids  PAIN MEDICATION USE AND EXPECTATIONS  You have likely been given narcotic medications to help control your pain.  After a traumatic event that results in an fracture (broken bone) with or without surgery, it is ok to use narcotic pain medications to help control one's pain.  We understand that everyone responds to pain differently and each individual patient will be evaluated on a regular basis for the continued need for narcotic medications. Ideally, narcotic medication use should last no more than 6-8 weeks (coinciding with fracture healing).   As a patient it is your responsibility as well to monitor narcotic medication use and report the amount and frequency you use these medications when you come to your office visit.   We would also advise that if you are using narcotic medications, you should take a dose prior to therapy to maximize you participation.  IF YOU ARE ON NARCOTIC MEDICATIONS IT IS NOT PERMISSIBLE TO OPERATE A MOTOR VEHICLE (MOTORCYCLE/CAR/TRUCK/MOPED) OR HEAVY MACHINERY DO NOT MIX NARCOTICS WITH OTHER CNS (CENTRAL NERVOUS SYSTEM) DEPRESSANTS SUCH AS ALCOHOL   STOP SMOKING OR USING NICOTINE PRODUCTS!!!!  As discussed nicotine severely impairs your body's ability to heal surgical and traumatic wounds but also  impairs bone healing.  Wounds and bone heal by forming microscopic blood vessels (angiogenesis) and nicotine is a vasoconstrictor (essentially, shrinks blood vessels).  Therefore, if vasoconstriction occurs to these microscopic blood vessels they essentially disappear and are unable to deliver necessary nutrients to the healing tissue.  This is one modifiable factor that you can do to dramatically increase your chances of healing your injury.    (This means no smoking, no nicotine gum, patches, etc)  DO NOT USE NONSTEROIDAL ANTI-INFLAMMATORY DRUGS (NSAID'S)  Using products such as Advil (ibuprofen), Aleve (naproxen), Motrin (ibuprofen) for additional pain control during fracture healing can delay and/or prevent the healing response.  If you would like to take over the counter (OTC) medication, Tylenol (acetaminophen) is ok.  However, some narcotic medications that are given for pain control contain acetaminophen as well. Therefore, you should not exceed more than 4000 mg of tylenol in a day if you do not have liver disease.  Also note that there are may OTC medicines, such as cold medicines and allergy medicines that my contain tylenol as well.  If you have any questions about medications and/or interactions please ask your doctor/PA or your pharmacist.      ICE AND ELEVATE INJURED/OPERATIVE EXTREMITY  Using ice and elevating the injured extremity above your heart can help with swelling and pain control.  Icing in a pulsatile fashion, such as 20 minutes on and 20 minutes off, can be followed.    Do not place ice directly on skin. Make sure there is a barrier between to skin and the ice pack.    Using frozen items such as frozen  peas works well as the conform nicely to the are that needs to be iced.  USE AN ACE WRAP OR TED HOSE FOR SWELLING CONTROL  In addition to icing and elevation, Ace wraps or TED hose are used to help limit and resolve swelling.  It is recommended to use Ace wraps or TED hose until  you are informed to stop.    When using Ace Wraps start the wrapping distally (farthest away from the body) and wrap proximally (closer to the body)   Example: If you had surgery on your leg or thing and you do not have a splint on, start the ace wrap at the toes and work your way up to the thigh        If you had surgery on your upper extremity and do not have a splint on, start the ace wrap at your fingers and work your way up to the upper arm   CALL THE OFFICE WITH ANY QUESTIONS OR CONCERNS: [phone]   VISIT OUR WEBSITE FOR ADDITIONAL INFORMATION: [email or web address]   Discharge Wound Care Instructions  Do NOT apply any ointments, solutions or lotions to pin sites or surgical wounds.  These prevent needed drainage and even though solutions like hydrogen peroxide kill bacteria, they also damage cells lining the pin sites that help fight infection.  Applying lotions or ointments can keep the wounds moist and can cause them to breakdown and open up as well. This can increase the risk for infection. When in doubt call the office.  Surgical incisions should be dressed daily.  If any drainage is noted, use one layer of adaptic, then gauze, Kerlix, and an ace wrap.  Once the incision is completely dry and without drainage, it may be left open to air out.  Showering may begin 36-48 hours later.  Cleaning gently with soap and water.  Traumatic wounds should be dressed daily as well.    One layer of adaptic, gauze, Kerlix, then ace wrap.  The adaptic can be discontinued once the draining has ceased    If you have a wet to dry dressing: wet the gauze with saline the squeeze as much saline out so the gauze is moist (not soaking wet), place moistened gauze over wound, then place a dry gauze over the moist one, followed by Kerlix wrap, then ace wrap.  You were cared for by a hospitalist during your hospital stay. If you have any questions about your discharge medications or the care you received  while you were in the hospital after you are discharged, you can call the unit and ask to speak with the hospitalist on call if the hospitalist that took care of you is not available. Once you are discharged, your primary care physician will handle any further medical issues. Please note that NO REFILLS for any discharge medications will be authorized once you are discharged, as it is imperative that you return to your primary care physician (or establish a relationship with a primary care physician if you do not have one) for your aftercare needs so that they can reassess your need for medications and monitor your lab values.

## 2019-10-02 NOTE — Evaluation (Signed)
Occupational Therapy Evaluation Patient Details Name: Thomas [name] MRN: [id number] DOB: 17-May-1941 Today's Date: 10/02/2019    History of Present Illness 79 yo admitted s/p fall with left femur fx s/p percutaneous fixation. PMHx: CAD, HTN, COPD, AAA, polio   Clinical Impression   Patient presenting with decreased I in self care, balance, functional mobility/transfers, endurance, and safety awareness. Patient reports being independent PTA. Patient currently functioning at min - mod A for self care tasks, min guard for ambulation and min A for functional transfers with RW. Patient will benefit from acute OT to increase overall independence in the areas of ADLs, functional mobility, and safety awareness in order to safely discharge home with caregiver.    Follow Up Recommendations  Home health OT;Supervision/Assistance - 24 hour    Equipment Recommendations  3 in 1 bedside commode    Recommendations for Other Services       Precautions / Restrictions Precautions Precautions: Fall Restrictions Weight Bearing Restrictions: Yes LLE Weight Bearing: Weight bearing as tolerated      Mobility Bed Mobility Overal bed mobility: Needs Assistance Bed Mobility: Supine to Sit     Supine to sit: Min guard;HOB elevated     General bed mobility comments: HOB 25 degrees with use of rail and increased time  Transfers Overall transfer level: Needs assistance   Transfers: Sit to/from Stand Sit to Stand: Min guard         General transfer comment: cues for hand placement and safety    Balance Overall balance assessment: Needs assistance   Sitting balance-Leahy Scale: Good       Standing balance-Leahy Scale: Fair Standing balance comment: pt able to stand at sink without UE support, bil UE support for gait          ADL either performed or assessed with clinical judgement   ADL Overall ADL's : Needs assistance/impaired     Grooming: Wash/dry hands;Wash/dry face;Oral  care;Sitting;Set up   Upper Body Bathing: Set up;Sitting   Lower Body Bathing: Minimal assistance;Sit to/from stand   Upper Body Dressing : Set up;Sitting   Lower Body Dressing: Moderate assistance;Sit to/from stand   Toilet Transfer: Minimal assistance;Regular Toilet;RW             General ADL Comments: Pt does report having sock aid and LH reacher at home that he uses to increase I with self care tasks PTA     Vision Baseline Vision/History: Wears glasses Wears Glasses: At all times Patient Visual Report: No change from baseline              Pertinent Vitals/Pain Pain Assessment: 0-10 Pain Score: 2  Pain Location: left hip Pain Descriptors / Indicators: Aching;Guarding Pain Intervention(s): Limited activity within patient's tolerance;Monitored during session;Repositioned     Hand Dominance Right   Extremity/Trunk Assessment Upper Extremity Assessment Upper Extremity Assessment: Overall WFL for tasks assessed   Lower Extremity Assessment Lower Extremity Assessment: LLE deficits/detail LLE Deficits / Details: decreased strength, ROM due to pain   Cervical / Trunk Assessment Cervical / Trunk Assessment: Normal   Communication Communication Communication: No difficulties   Cognition Arousal/Alertness: Awake/alert Behavior During Therapy: WFL for tasks assessed/performed Overall Cognitive Status: Within Functional Limits for tasks assessed             Exercises General Exercises - Lower Extremity Long Arc Quad: AROM;Left;Seated;10 reps Hip Flexion/Marching: AROM;Left;Seated;10 reps   Shoulder Instructions      Home Living Family/patient expects to be discharged to:: Private residence Living Arrangements:  Spouse/significant other Available Help at Discharge: Family;Available 24 hours/day Type of Home: House Home Access: Stairs to enter [employer] of Steps: 5   Home Layout: One level     Bathroom Shower/Tub: Tub/shower unit          Home Equipment: Environmental consultant - 2 wheels;Cane - single point;Shower seat          Prior Functioning/Environment Level of Independence: Independent                 OT Problem List: Decreased strength;Pain;Decreased activity tolerance;Decreased safety awareness;Impaired balance (sitting and/or standing);Decreased knowledge of use of DME or AE      OT Treatment/Interventions: Self-care/ADL training;Therapeutic exercise;Therapeutic activities;Energy conservation;DME and/or AE instruction;Patient/family education;Manual therapy;Balance training    OT Goals(Current goals can be found in the care plan section) Acute Rehab OT Goals Patient Stated Goal: return home OT Goal Formulation: With patient Time For Goal Achievement: 10/16/19 Potential to Achieve Goals: Good ADL Goals Pt Will Perform Grooming: with modified independence Pt Will Perform Upper Body Bathing: with modified independence Pt Will Perform Lower Body Bathing: with supervision Pt Will Perform Upper Body Dressing: with modified independence Pt Will Perform Lower Body Dressing: with supervision Pt Will Transfer to Toilet: with supervision Pt Will Perform Toileting - Clothing Manipulation and hygiene: with supervision Pt Will Perform Tub/Shower Transfer: with supervision  OT Frequency: Min 2X/week   Barriers to D/C: (none known at this time)             AM-PAC OT "6 Clicks" Daily Activity     Outcome Measure Help from another person eating meals?: None Help from another person taking care of personal grooming?: A Little Help from another person toileting, which includes using toliet, bedpan, or urinal?: A Little Help from another person bathing (including washing, rinsing, drying)?: A Little Help from another person to put on and taking off regular upper body clothing?: A Little Help from another person to put on and taking off regular lower body clothing?: A Lot 6 Click Score: 18   End of Session Equipment  Utilized During Treatment: Rolling walker;Gait belt Nurse Communication: Mobility status  Activity Tolerance: Patient tolerated treatment well Patient left: in bed;with call bell/phone within reach;with bed alarm set  OT Visit Diagnosis: Unsteadiness on feet (R26.81);Muscle weakness (generalized) (M62.81);History of falling (Z91.81);Pain Pain - Right/Left: Left Pain - part of body: Hip                Time: 1610-9604 OT Time Calculation (min): 17 min Charges:  OT General Charges $OT Visit: 1 Visit OT Evaluation $OT Eval Low Complexity: 1 Low  Lonney Revak P MS, OTR/L 10/02/2019, 3:15 PM

## 2019-10-02 NOTE — Progress Notes (Addendum)
PROGRESS NOTE    Thomas [name]  NTZ:[id number] DOB: 08-14-1941 DOA: 09/30/2019 PCP: Kirby Funk, MD     Brief Narrative:  Patient is a 79 year old male with a past medical history of CAD, hypertension, AAA, and COPD who presented to the ED on 2/7 after a fall.  He did not lose consciousness or hit his head.  He had pain over the left hip area, x-ray revealed a left hip fracture.  Ortho consulted and brought him to the OR on 2/8.   New events last 24 hours / Subjective: Reports pain is relatively well controlled.  He is eager to work with therapy, would rather go home as opposed to SNF.  No nausea or vomiting.  Appetite is returning.  Had some urinary retention last night, is urinating back to baseline today.  Assessment & Plan:   Principal Problem:   Closed left hip fracture, initial encounter [facility])  S/p repair on 2/8  Appreciate Ortho  Percocet 5-325 mg every 4 hours as needed for pain  PT  Patient would prefer home health  Active Problems:   Hyperlipidemia   COPD mixed type (HCC)  Albuterol nebulization every 4 hours as needed  Dulera 200-5 mcg, 2 puffs twice daily    CAD (coronary artery disease)    Hypertension  Continue lisinopril 10 mg daily  Nutrition Problem: Increased nutrient needs Etiology: post-op healing   DVT prophylaxis: Lovenox Code Status: Full Family Communication: Self Coming From: Home Disposition Plan: Home with home health versus SNF Barriers to Discharge: Failure to progress/uncontrollable pain  Consultants:   Orthopedic surgery  Procedures:   Left hip ORIF on 10/01/19  Antimicrobials:  Anti-infectives (From admission, onward)   Start     Dose/Rate Route Frequency Ordered Stop   10/01/19 2030  ceFAZolin (ANCEF) IVPB 2g/100 mL premix     2 g 200 mL/hr over 30 Minutes Intravenous Every 8 hours 10/01/19 1725 10/02/19 2159   10/01/19 1515  ceFAZolin (ANCEF) IVPB 2g/100 mL premix  Status:  Discontinued     2 g 200 mL/hr over 30  Minutes Intravenous Every 8 hours 10/01/19 1512 10/01/19 1725   10/01/19 1257  vancomycin (VANCOCIN) powder  Status:  Discontinued       As needed 10/01/19 1257 10/01/19 1310   10/01/19 1200  ceFAZolin (ANCEF) IVPB 2g/100 mL premix     2 g 200 mL/hr over 30 Minutes Intravenous On call to O.R. 10/01/19 0818 10/01/19 1217      Objective: Vitals:   10/02/19 0508 10/02/19 0740 10/02/19 0834 10/02/19 0941  BP: 128/61 (!) 142/76  130/67  Pulse: 65 66    Resp: 18 18    Temp: 97.6 F (36.4 C) 98 F (36.7 C)    TempSrc: Oral Oral    SpO2: 94% 95% 93%   Weight:      Height:        Intake/Output Summary (Last 24 hours) at 10/02/2019 1011 Last data filed at 10/02/2019 0700 Gross per 24 hour  Intake 620 ml  Output 2715 ml  Net -2095 ml   Filed Weights   10/01/19 1059  Weight: 83.5 kg    Examination:  General exam: Appears calm and comfortable  Respiratory system: Clear to auscultation. Respiratory effort normal. No respiratory distress. No conversational dyspnea.  Cardiovascular system: S1 & S2 heard, RRR. No murmurs. No pedal edema. Gastrointestinal system: Abdomen is nondistended, soft and nontender. Normal bowel sounds heard. Central nervous system: Alert and oriented. No focal neurological deficits. Speech  clear.  Extremities: Symmetric in appearance  Skin: No rashes, lesions or ulcers on exposed skin  Psychiatry: Judgement and insight appear normal. Mood & affect appropriate.   Data Reviewed: I have personally reviewed following labs and imaging studies  CBC: Recent Labs  Lab 10/01/19 0005 10/01/19 0642 10/02/19 0204  WBC 7.7 8.5 6.9  NEUTROABS 5.1 6.8  --   HGB 12.4* 12.7* 12.6*  HCT 37.9* 38.3* 37.5*  MCV 96.7 95.5 94.2  PLT 188 184 183   Basic Metabolic Panel: Recent Labs  Lab 10/01/19 0005 10/01/19 0642 10/02/19 0204  NA 138 138 136  K 4.4 4.4 4.0  CL 104 103 101  CO2 23 25 23   GLUCOSE 128* 122* 134*  BUN 20 19 17   CREATININE 0.90 0.84 0.94  CALCIUM  8.8* 8.8* 8.8*   GFR: Estimated Creatinine Clearance: 67.1 mL/min (by C-G formula based on SCr of 0.94 mg/dL). Liver Function Tests: Recent Labs  Lab 10/01/19 0642  AST 21  ALT 21  ALKPHOS 62  BILITOT 0.8  PROT 6.8  ALBUMIN 3.3*   Coagulation Profile: Recent Labs  Lab 10/01/19 0005  INR 1.0   Thyroid Function Tests: Recent Labs    10/01/19 0642  TSH 1.974   Recent Results (from the past 240 hour(s))  Respiratory Panel by RT PCR (Flu A&B, Covid) - Nasopharyngeal Swab     Status: None   Collection Time: 10/01/19 12:07 AM   Specimen: Nasopharyngeal Swab  Result Value Ref Range Status   SARS Coronavirus 2 by RT PCR NEGATIVE NEGATIVE Final    Comment: (NOTE) SARS-CoV-2 target nucleic acids are NOT DETECTED. The SARS-CoV-2 RNA is generally detectable in upper respiratoy specimens during the acute phase of infection. The lowest concentration of SARS-CoV-2 viral copies this assay can detect is 131 copies/mL. A negative result does not preclude SARS-Cov-2 infection and should not be used as the sole basis for treatment or other patient management decisions. A negative result may occur with  improper specimen collection/handling, submission of specimen other than nasopharyngeal swab, presence of viral mutation(s) within the areas targeted by this assay, and inadequate number of viral copies (<131 copies/mL). A negative result must be combined with clinical observations, patient history, and epidemiological information. The expected result is Negative. Fact Sheet for Patients:  11/29/19 Fact Sheet for Healthcare Providers:  11/29/19 This test is not yet ap proved or cleared by the https://www.moore.com/ FDA and  has been authorized for detection and/or diagnosis of SARS-CoV-2 by FDA under an Emergency Use Authorization (EUA). This EUA will remain  in effect (meaning this test can be used) for the duration of  the COVID-19 declaration under Section 564(b)(1) of the Act, 21 U.S.C. section 360bbb-3(b)(1), unless the authorization is terminated or revoked sooner.    Influenza A by PCR NEGATIVE NEGATIVE Final   Influenza B by PCR NEGATIVE NEGATIVE Final    Comment: (NOTE) The Xpert Xpress SARS-CoV-2/FLU/RSV assay is intended as an aid in  the diagnosis of influenza from Nasopharyngeal swab specimens and  should not be used as a sole basis for treatment. Nasal washings and  aspirates are unacceptable for Xpert Xpress SARS-CoV-2/FLU/RSV  testing. Fact Sheet for Patients: https://www.young.biz/ Fact Sheet for Healthcare Providers: [place] This test is not yet approved or cleared by the https://www.moore.com/ FDA and  has been authorized for detection and/or diagnosis of SARS-CoV-2 by  FDA under an Emergency Use Authorization (EUA). This EUA will remain  in effect (meaning this test can be used)  for the duration of the  Covid-19 declaration under Section 564(b)(1) of the Act, 21  U.S.C. section 360bbb-3(b)(1), unless the authorization is  terminated or revoked. Performed at [facility], [place] [address], [place], [place] [address]   Surgical pcr screen     Status: Abnormal   Collection Time: 10/01/19  8:09 AM   Specimen: Nasal Mucosa; Nasal Swab  Result Value Ref Range Status   MRSA, PCR NEGATIVE NEGATIVE Final   Staphylococcus aureus POSITIVE (A) NEGATIVE Final    Comment: (NOTE) The Xpert SA Assay (FDA approved for NASAL specimens in patients 62 years of age and older), is one component of a comprehensive surveillance program. It is not intended to diagnose infection nor to guide or monitor treatment. Performed at [facility], [place] [address], [place], [place] [address]       Radiology Studies: DG Chest 1 View  Result Date: 09/30/2019 CLINICAL DATA:  Fall and pain EXAM: CHEST  1 VIEW COMPARISON:  None. FINDINGS: The heart size  and mediastinal contours are within normal limits. Overlying median sternotomy wires. Aortic knob calcifications. Both lungs are clear. The visualized skeletal structures are unremarkable. IMPRESSION: No active disease. Electronically Signed   By: Prudencio Pair M.D.   On: 09/30/2019 23:44   DG Knee Complete 4 Views Left  Result Date: 09/30/2019 CLINICAL DATA:  Fall and pain EXAM: LEFT KNEE - COMPLETE 4+ VIEW COMPARISON:  None. FINDINGS: No evidence of fracture, dislocation, or joint effusion. 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 Dense vascular calcifications are seen. Surgical clips in the posterior knee. IMPRESSION: No acute osseous abnormality. Electronically Signed   By: Prudencio Pair M.D.   On: 09/30/2019 23:44   DG C-Arm 1-60 Min  Result Date: 10/01/2019 CLINICAL DATA:  Known left hip fracture EXAM: OPERATIVE LEFT HIP WITH PELVIS; DG C-ARM 1-60 MIN COMPARISON:  Films from the previous day. FLUOROSCOPY TIME:  Radiation Exposure Index (as provided by the fluoroscopic device): Not available If the device does not provide the exposure index: Fluoroscopy Time:  1 minutes 17 seconds Number of Acquired Images:  9 FINDINGS: Initial images again demonstrate the left femoral neck fracture. Three fixation screws are noted traversing the femoral neck. Fracture fragments are in near anatomic alignment. IMPRESSION: ORIF of left femoral fracture. Electronically Signed   By: Inez Catalina M.D.   On: 10/01/2019 13:46   DG HIP PORT UNILAT W OR W/O PELVIS 1V LEFT  Result Date: 10/01/2019 CLINICAL DATA:  Status post ORIF of left hip fracture EXAM: DG HIP (WITH OR WITHOUT PELVIS) 1V PORT LEFT COMPARISON:  Film from the previous day. FINDINGS: Previously seen femoral neck fracture is again identified with 3 fixation screws traversing the fracture site. No other focal abnormality is noted. IMPRESSION: Status post ORIF of proximal left femoral fracture. Electronically  Signed   By: Inez Catalina M.D.   On: 10/01/2019 14:16   DG HIP OPERATIVE UNILAT W OR W/O PELVIS LEFT  Result Date: 10/01/2019 CLINICAL DATA:  Known left hip fracture EXAM: OPERATIVE LEFT HIP WITH PELVIS; DG C-ARM 1-60 MIN COMPARISON:  Films from the previous day. FLUOROSCOPY TIME:  Radiation Exposure Index (as provided by the fluoroscopic device): Not available If the device does not provide the exposure index: Fluoroscopy Time:  1 minutes 17 seconds Number of Acquired Images:  9 FINDINGS: Initial images again demonstrate the left femoral neck fracture. Three fixation screws are noted traversing the femoral neck. Fracture  fragments are in near anatomic alignment. IMPRESSION: ORIF of left femoral fracture. Electronically Signed   By: Alcide Clever M.D.   On: 10/01/2019 13:46   DG Hip Unilat W or Wo Pelvis 2-3 Views Left  Result Date: 09/30/2019 CLINICAL DATA:  Fall and pain EXAM: DG HIP (WITH OR WITHOUT PELVIS) 2-3V LEFT COMPARISON:  None. FINDINGS: There is a nondisplaced slightly impacted obliquely oriented left femoral neck fracture. The femoral head still well seated within the acetabulum. 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 Diffuse osteopenia. Degenerative changes in the lower lumbar spine. IMPRESSION: Nondisplaced obliquely oriented left femoral neck fracture. Electronically Signed   By: Jonna Clark M.D.   On: 09/30/2019 23:43   DG Femur Min 2 Views Left  Result Date: 09/30/2019 CLINICAL DATA:  Status post fall. EXAM: LEFT FEMUR 2 VIEWS COMPARISON:  None. FINDINGS: Acute nondisplaced fracture is seen extending through the neck of the proximal left femur. There is no evidence of an associated dislocation. Mild vascular calcification is noted. IMPRESSION: Acute fracture of the proximal left femur. Electronically Signed   By: Aram Candela M.D.   On: 09/30/2019 23:40     Scheduled Meds: . acetaminophen  500 mg Oral BID  . cholecalciferol  2,000 Units Oral BID  . enoxaparin (LOVENOX)  injection  40 mg Subcutaneous Q24H  . feeding supplement (ENSURE ENLIVE)  237 mL Oral BID BM  . lisinopril  10 mg Oral Daily  . mometasone-formoterol  2 puff Inhalation BID  . multivitamin with minerals  1 tablet Oral Daily  . mupirocin ointment  1 application Topical BID   Continuous Infusions: .  ceFAZolin (ANCEF) IV 2 g (10/02/19 0504)     LOS: 1 day    Time spent: 20 minutes   Sharlene Dory, DO [facility] 10/02/2019, 10:11 AM   Available via Epic secure chat 7am-7pm After these hours, please refer to coverage provider listed on [email or web address]

## 2019-10-02 NOTE — Progress Notes (Signed)
Orthopaedic Trauma Progress Note  S: Doing well this morning.  Minimal to no pain.  Knee pain that he was experiencing yesterday has resolved.  Has not been up out of bed yet but is motivated to work with therapy.  Is hopeful to return home in the next few days.  O:  Vitals:   10/02/19 0508 10/02/19 0740  BP: 128/61 (!) 142/76  Pulse: 65 66  Resp: 18 18  Temp: 97.6 F (36.4 C) 98 F (36.7 C)  SpO2: 94% 95%    General -s Stting up in bed, no acute distress Respiratory -  No increased work of breathing.  Left lower extremity - Dressing in place over hip is clean, dry, intact.  No tenderness with palpation over hip, thigh, knee.  Able to actively flex the knee some without significant discomfort.  Ankle dorsiflexion and plantarflexion is intact.+EHL.+ FHL.  Compartments soft and compressible.  Neurovascularly intact  Imaging: Stable post op imaging.   Labs:  Results for orders placed or performed during the hospital encounter of 09/30/19 (from the past 24 hour(s))  Surgical pcr screen     Status: Abnormal   Collection Time: 10/01/19  8:09 AM   Specimen: Nasal Mucosa; Nasal Swab  Result Value Ref Range   MRSA, PCR NEGATIVE NEGATIVE   Staphylococcus aureus POSITIVE (A) NEGATIVE  Basic metabolic panel     Status: Abnormal   Collection Time: 10/02/19  2:04 AM  Result Value Ref Range   Sodium 136 135 - 145 mmol/L   Potassium 4.0 3.5 - 5.1 mmol/L   Chloride 101 98 - 111 mmol/L   CO2 23 22 - 32 mmol/L   Glucose, Bld 134 (H) 70 - 99 mg/dL   BUN 17 8 - 23 mg/dL   Creatinine, Ser 6.94 0.61 - 1.24 mg/dL   Calcium 8.8 (L) 8.9 - 10.3 mg/dL   GFR calc non Af Amer >60 >60 mL/min   GFR calc Af Amer >60 >60 mL/min   Anion gap 12 5 - 15  CBC     Status: Abnormal   Collection Time: 10/02/19  2:04 AM  Result Value Ref Range   WBC 6.9 4.0 - 10.5 K/uL   RBC 3.98 (L) 4.22 - 5.81 MIL/uL   Hemoglobin 12.6 (L) 13.0 - 17.0 g/dL   HCT 85.4 (L) 62.7 - 03.5 %   MCV 94.2 80.0 - 100.0 fL   MCH 31.7  26.0 - 34.0 pg   MCHC 33.6 30.0 - 36.0 g/dL   RDW 00.9 38.1 - 82.9 %   Platelets 183 150 - 400 K/uL   nRBC 0.0 0.0 - 0.2 %    Assessment: 79 year old male status post fall, 1 Day Post-Op   Injuries: Left femoral neck fracture s/p percutaneous fixation   Weightbearing: WBAT LLE  Insicional and dressing care: OK to remove dressings 10/03/2019 and leave open to air with dry gauze PRN   Showering: Okay to begin showering 10/04/2019  Orthopedic device(s): None   CV/Blood loss: Hgb 12.6 this morning. Hemodynamically stable  Pain management:  1. Tylenol 500 mg twice daily 2. Percocet 5-3 25 q 4 hours PRN 3. Fentanyl 25 mcg q 2 hours PRN  VTE prophylaxis: Lovenox starting today.  Should continue this for 30 days at discharge SCDs: In place bilateral lower extremities  ID:  Ancef 2gm post op  Foley/Lines:  No foley, KVO IVFs  Medical co-morbidities: coronary artery disease, hypertension, aortic aneurysm, COPD  Impediments to Fracture Healing: Vitamin D level 22.  Will start on vitamin D3 supplementation  Dispo: PT/OT eval today, dispo pending.  Hopefully home in the next 48 to 72 hours if therapy is going well.  Okay for discharge from ortho standpoint once cleared by medicine team and therapies  Follow - up plan: 2 weeks  Contact information:  Katha Hamming MD, Patrecia Pace PA-C   Kaylon Hitz A[facility] [phone] (office) [email or web address]

## 2019-10-02 NOTE — TOC Progression Note (Addendum)
Transition of Care [facility]) - Progression Note    Patient Details  Name: GERAL COKER MRN: [id number] Date of Birth: [birth year]  Transition of Care [facility]) CM/SW Contact  Doy Hutching, [place] Phone Number: 10/02/2019, 1:36 PM  Clinical Narrative:    1:36pm- CSW spoke with pt at bedside. Provided pt with CMS list and encouraged him to pick a top three, he states his wife will be here around 2ish and they'll discuss. Pt aware dc likely tomorrow if everything remains stable and he works with therapy.   1:42pm- CSW ordered a 3n1 from [employer], this will be delivered to room.   Expected Discharge Plan: Home w Home Health Services Barriers to Discharge: Continued Medical Work up  Expected Discharge Plan and Services Expected Discharge Plan: Home w Home Health Services In-house Referral: Clinical Social Work Discharge Planning Services: CM Consult Post Acute Care Choice: Durable Medical Equipment, Home Health Living arrangements for the past 2 months: Single Family Home  Readmission Risk Interventions Readmission Risk Prevention Plan 10/02/2019  Post Dischage Appt Not Complete  Appt Comments disposition pending  Medication Screening Complete  Transportation Screening Complete  Some recent data might be hidden

## 2019-10-02 NOTE — TOC Initial Note (Signed)
Transition of Care [facility]) - Initial/Assessment Note    Patient Details  Name: Thomas [name] MRN: [id number] Date of Birth: 24-Feb-1941  Transition of Care [facility]) CM/SW Contact:    Alexander Mt, LCSW Phone Number: 10/02/2019, 10:33 AM  Clinical Narrative:                 CSW met with pt at bedside. Introduced self, role, reason for visit. Pt from home with his spouse; they have an adult son that will bring his wife to hospital later today. Confirmed home address and PCP; pt usually ambulates without any assistance but uses a cane as needed for community ambulation. Pt also has a walker and shower bench at home. Pt is feeling better today; he notes that he slipped on water. We discussed that PT/OT will evaluate pt and make a recommendation for continued level of services. We discussed that often pts are recommended for [facility] or SNF placement. Pt declines SNF placement; he has gotten both doses of the Pfizer vaccine but still feels that rehab poses a threat for contracting COVID. We discussed risk is everywhere but pt still desires return home. He will have 24/7 assistance from his wife and their son can check in as needed.   Pt wife has had [facility] services in the past. Pt cannot remember which agency it was. CSW will stop by later after PT/OT recommendations have been placed.  Expected Discharge Plan: [place] Barriers to Discharge: Continued Medical Work up   Patient Goals and CMS Choice Patient states their goals for this hospitalization and ongoing recovery are:: to return home with assistance from wife/HH services CMS Medicare.gov Compare Post Acute Care list provided to:: Patient Choice offered to / list presented to : Patient  Expected Discharge Plan and Services Expected Discharge Plan: [place] In-house Referral: Clinical Social Work Discharge Planning Services: CM Consult Post Acute Care Choice: Durable Medical Equipment, Home Health Living  arrangements for the past 2 months: Single Family Home                                      Prior Living Arrangements/Services Living arrangements for the past 2 months: Single Family Home Lives with:: Spouse Patient language and need for interpreter reviewed:: Yes(no needs) Do you feel safe going back to the place where you live?: Yes      Need for Family Participation in Patient Care: Yes (Comment)(assistance with daily cares) Care giver support system in place?: Yes (comment)(pt spouse; adult son) Current home services: DME Criminal Activity/Legal Involvement Pertinent to Current Situation/Hospitalization: No - Comment as needed  Activities of Daily Living      Permission Sought/Granted Permission sought to share information with : Facility Sport and exercise psychologist, Family Supports Permission granted to share information with : Yes, Verbal Permission Granted  Share Information with NAME: Thomas [name]  Permission granted to share info w AGENCY: [facility] agencies  Permission granted to share info w Relationship: spouse  Permission granted to share info w Contact Information: [phone]  Emotional Assessment Appearance:: Appears stated age Attitude/Demeanor/Rapport: Engaged, Gracious Affect (typically observed): Accepting, Adaptable, Pleasant, Appropriate Orientation: : Oriented to Self, Oriented to Place, Oriented to  Time, Oriented to Situation Alcohol / Substance Use: Not Applicable Psych Involvement: No (comment)  Admission diagnosis:  Hip fracture (HCC) [S72.009A] Pain [R52] Closed fracture of neck of left femur, initial encounter ([place]) [  S72.002A] Patient Active Problem List   Diagnosis Date Noted  . Closed left hip fracture, initial encounter ([place]) 10/01/2019  . Hip fracture ([place]) 10/01/2019  . Nocturnal leg cramps 09/27/2014  . S/P lumbar microdiscectomy 11/07/2013  . Lumbar disc disease 10/29/2013  . CAD (coronary artery disease)   . Obesity (BMI 30-39.9)    . Pupil diameter unequal   . Hyperlipidemia   . Hypertensive heart disease   . 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 Esophageal reflux 01/01/2008  . Seasonal and perennial allergic rhinitis    PCP:  Lavone Orn, MD Pharmacy:   [facility] [phone] [place], [place] - [place] [address] [place] [address] Phone: [phone] Fax: [phone]  [facility] [phone] - [place], [place] AT [place] [address] [place] [address] Phone: [phone] Fax: [phone]     Social Determinants of Health ([place]) Interventions    Readmission Risk Interventions Readmission Risk Prevention Plan 10/02/2019  Post Dischage Appt Not Complete  Appt Comments disposition pending  Medication Screening Complete  Transportation Screening Complete  Some recent data might be hidden

## 2019-10-02 NOTE — Progress Notes (Addendum)
In and out cath performed with Arlys John, NT3 in the room. Output collected was 1000 ml, patient tolerated well. Will continue to monitor with remainder of shift.

## 2019-10-02 NOTE — Evaluation (Signed)
Physical Therapy Evaluation Patient Details Name: Thomas [name] MRN: [id number] DOB: 06-12-1941 Today's Date: 10/02/2019   History of Present Illness  79 yo admitted s/p fall with left femur fx s/p percutaneous fixation. PMHx: CAD, HTN, COPD, AAA, polio  Clinical Impression  Pt very pleasant and eager to get up to toilet. Pt with good mobility with limited pain at 3/10 in left hip. Pt able to perform all basic transfers and gait with minguard assist and has wife able to assist at home. Pt with decreased strength, ROM, transfers and gait who will benefit from acute therapy to maximize mobility and function. Pt will need to perform stairs prior to D/C.     Follow Up Recommendations Home health PT    Equipment Recommendations  3in1 (PT)    Recommendations for Other Services OT consult     Precautions / Restrictions Precautions Precautions: Fall Restrictions Weight Bearing Restrictions: Yes LLE Weight Bearing: Weight bearing as tolerated      Mobility  Bed Mobility Overal bed mobility: Needs Assistance Bed Mobility: Supine to Sit     Supine to sit: Min guard;HOB elevated     General bed mobility comments: HOB 25 degrees with use of rail and increased time  Transfers Overall transfer level: Needs assistance   Transfers: Sit to/from Stand Sit to Stand: Min guard         General transfer comment: cues for hand placement and safety  Ambulation/Gait Ambulation/Gait assistance: Min guard Gait Distance (Feet): 115 Feet Assistive device: Rolling walker (2 wheeled) Gait Pattern/deviations: Step-to pattern;Decreased stride length;Trunk flexed   Gait velocity interpretation: >2.62 ft/sec, indicative of community ambulatory General Gait Details: cues for posture, looking up and position in RW. Pt walked 15' then 115'  Stairs            Wheelchair Mobility    Modified Rankin (Stroke Patients Only)       Balance Overall balance assessment: Needs assistance    Sitting balance-Leahy Scale: Good       Standing balance-Leahy Scale: Fair Standing balance comment: pt able to stand at sink without UE support, bil UE support for gait                             Pertinent Vitals/Pain Pain Assessment: 0-10 Pain Score: 3  Pain Location: left hip Pain Descriptors / Indicators: Aching;Guarding Pain Intervention(s): Limited activity within patient's tolerance;Monitored during session;Premedicated before session;Repositioned;Patient requesting pain meds-RN notified    Home Living Family/patient expects to be discharged to:: Private residence Living Arrangements: Spouse/significant other Available Help at Discharge: Family;Available 24 hours/day Type of Home: House Home Access: Stairs to enter   [employer] of Steps: 5 Home Layout: One level Home Equipment: Walker - 2 wheels;Cane - single point      Prior Function Level of Independence: Independent               Hand Dominance        Extremity/Trunk Assessment   Upper Extremity Assessment Upper Extremity Assessment: Overall WFL for tasks assessed    Lower Extremity Assessment Lower Extremity Assessment: LLE deficits/detail LLE Deficits / Details: decreased strength, ROM due to pain    Cervical / Trunk Assessment Cervical / Trunk Assessment: Normal  Communication   Communication: No difficulties  Cognition Arousal/Alertness: Awake/alert Behavior During Therapy: WFL for tasks assessed/performed Overall Cognitive Status: Within Functional Limits for tasks assessed  General Comments      Exercises General Exercises - Lower Extremity Long Arc Quad: AROM;Left;Seated;10 reps Hip Flexion/Marching: AROM;Left;Seated;10 reps   Assessment/Plan    PT Assessment Patient needs continued PT services  PT Problem List Decreased strength;Decreased mobility;Decreased activity tolerance;Decreased  balance;Decreased knowledge of use of DME;Pain       PT Treatment Interventions DME instruction;Therapeutic exercise;Gait training;Stair training;Functional mobility training;Therapeutic activities;Patient/family education    PT Goals (Current goals can be found in the Care Plan section)  Acute Rehab PT Goals Patient Stated Goal: return home PT Goal Formulation: With patient Time For Goal Achievement: 10/16/19 Potential to Achieve Goals: Good    Frequency Min 5X/week   Barriers to discharge        Co-evaluation               AM-PAC PT "6 Clicks" Mobility  Outcome Measure Help needed turning from your back to your side while in a flat bed without using bedrails?: A Little Help needed moving from lying on your back to sitting on the side of a flat bed without using bedrails?: A Little Help needed moving to and from a bed to a chair (including a wheelchair)?: A Little Help needed standing up from a chair using your arms (e.g., wheelchair or bedside chair)?: A Little Help needed to walk in hospital room?: A Little Help needed climbing 3-5 steps with a railing? : A Little 6 Click Score: 18    End of Session Equipment Utilized During Treatment: Gait belt Activity Tolerance: Patient tolerated treatment well Patient left: in chair;with call bell/phone within reach;with chair alarm set Nurse Communication: Mobility status PT Visit Diagnosis: Other abnormalities of gait and mobility (R26.89);Difficulty in walking, not elsewhere classified (R26.2);Pain Pain - Right/Left: Left Pain - part of body: Hip    Time: 1017-1040 PT Time Calculation (min) (ACUTE ONLY): 23 min   Charges:   PT Evaluation $PT Eval Moderate Complexity: 1 Mod PT Treatments $Gait Training: 8-22 mins        Becky Colan P, PT Acute Rehabilitation Services Pager: [phone] Office: [phone]   Clarkson Rosselli B Gregorey Nabor 10/02/2019, 12:10 PM

## 2019-10-03 ENCOUNTER — Other Ambulatory Visit: Payer: Self-pay

## 2019-10-03 ENCOUNTER — Encounter (HOSPITAL_COMMUNITY): Payer: Self-pay | Admitting: Internal Medicine

## 2019-10-03 LAB — CBC
HCT: 34.7 % — ABNORMAL LOW (ref 39.0–52.0)
Hemoglobin: 11.8 g/dL — ABNORMAL LOW (ref 13.0–17.0)
MCH: 32 pg (ref 26.0–34.0)
MCHC: 34 g/dL (ref 30.0–36.0)
MCV: 94 fL (ref 80.0–100.0)
Platelets: 174 10*3/uL (ref 150–400)
RBC: 3.69 MIL/uL — ABNORMAL LOW (ref 4.22–5.81)
RDW: 13.5 % (ref 11.5–15.5)
WBC: 6.9 10*3/uL (ref 4.0–10.5)
nRBC: 0 % (ref 0.0–0.2)

## 2019-10-03 LAB — BASIC METABOLIC PANEL
Anion gap: 8 (ref 5–15)
BUN: 26 mg/dL — ABNORMAL HIGH (ref 8–23)
CO2: 25 mmol/L (ref 22–32)
Calcium: 8.4 mg/dL — ABNORMAL LOW (ref 8.9–10.3)
Chloride: 104 mmol/L (ref 98–111)
Creatinine, Ser: 0.99 mg/dL (ref 0.61–1.24)
GFR calc Af Amer: >60 mL/min (ref >60)
GFR calc non Af Amer: >60 mL/min (ref >60)
Glucose, Bld: 127 mg/dL — ABNORMAL HIGH (ref 70–99)
Potassium: 3.9 mmol/L (ref 3.5–5.1)
Sodium: 137 mmol/L (ref 135–145)

## 2019-10-03 MED ORDER — DIPHENHYDRAMINE HCL 50 MG/ML IJ SOLN: 25.0000 mg | Freq: Once | INTRAMUSCULAR | Status: DC | PRN

## 2019-10-03 MED ORDER — MENTHOL 3 MG MT LOZG: 1.0000 | LOZENGE | OROMUCOSAL | Status: DC | PRN

## 2019-10-03 MED ORDER — EPINEPHRINE 0.3 MG/0.3ML IJ SOAJ
0.3000 mg | Freq: Once | INTRAMUSCULAR | Status: DC | PRN
  Filled 2019-10-03: qty 0.6

## 2019-10-03 MED ORDER — VITAMIN D-3 125 MCG (5000 UT) PO TABS: 5000.0000 [IU] | ORAL_TABLET | Freq: Every day | ORAL | 1 refills | Status: DC

## 2019-10-03 MED ORDER — AMOXICILLIN 500 MG PO CAPS
500.0000 mg | ORAL_CAPSULE | Freq: Once | ORAL | Status: AC
  Administered 2019-10-03: 500 mg via ORAL
  Filled 2019-10-03: qty 1

## 2019-10-03 NOTE — Discharge Summary (Signed)
Physician Discharge Summary  EITHAN BEAGLE GQQ:[id number] DOB: 22-Oct-1940 DOA: 09/30/2019  PCP: Kirby Funk, MD  Admit date: 09/30/2019 Discharge date: 10/03/2019  Admitted From: Home Disposition:  Home  Recommendations for Outpatient Follow-up:  1. Follow up with PCP in 1 week 2. Follow up with orthopedic surgery in 2 weeks 3. Please obtain BMP/CBC in 1 week  Home Health: PT/OT w [place] Equipment/Devices: 3 in 1  Discharge Condition: Fair CODE STATUS: Full Diet recommendation: Heart healthy  Brief/Interim Summary: Patient is a 79 year old male with a past medical history of CAD, hypertension, AAA, and COPD who presented to the ED on 2/7 after a fall.  He did not lose consciousness or hit his head.  He had pain over the left hip area, x-ray revealed a left hip fracture.  Ortho consulted and brought him to the OR on 2/8.  He progressed well, recommended home health by physical therapy.  Subjective on day of discharge: Patient reports improving pain.  Ambulation is not back to baseline but he is eager to work with physical therapy.  Eating well.  Discharge Diagnoses:  Principal Problem:   Closed left hip fracture, initial encounter [facility]) Active Problems:   Hyperlipidemia   Hypertensive heart disease   COPD mixed type (HCC)   CAD (coronary artery disease)   Hip fracture (HCC)   Nutrition Problem: Increased nutrient needs Etiology: post-op healing  Discharge Instructions  Discharge Instructions    Call MD for:  difficulty breathing, headache or visual disturbances   Complete by: As directed    Call MD for:  difficulty breathing, headache or visual disturbances   Complete by: As directed    Call MD for:  redness, tenderness, or signs of infection (pain, swelling, redness, odor or green/yellow discharge around incision site)   Complete by: As directed    Call MD for:  redness, tenderness, or signs of infection (pain, swelling, redness, odor or green/yellow discharge  around incision site)   Complete by: As directed    Call MD for:  severe uncontrolled pain   Complete by: As directed    Call MD for:  severe uncontrolled pain   Complete by: As directed    Call MD for:  temperature >100.4   Complete by: As directed    Call MD for:  temperature >100.4   Complete by: As directed    Diet - low sodium heart healthy   Complete by: As directed    Increase activity slowly   Complete by: As directed    Increase activity slowly   Complete by: As directed      Allergies as of 10/03/2019      Reactions   Aspirin Hives   Codeine Hives   Fluticasone-salmeterol Hives   Montelukast Sodium Hives   Adhesive [tape] Rash   Cephalexin Rash   Ibuprofen Rash   Latex Rash   Penicillins Rash   Statins Rash   Sulfonamide Derivatives Rash      Medication List    STOP taking these medications   Glycopyrrolate-Formoterol 9-4.8 MCG/ACT Aero Commonly known as: Bevespi Aerosphere     TAKE these medications   aspirin EC 81 MG tablet Take 81 mg by mouth daily.   Dulera 200-5 MCG/ACT Aero Generic drug: mometasone-formoterol Inhale 2 puffs into the lungs 2 (two) times daily.   enoxaparin 40 MG/0.4ML injection Commonly known as: LOVENOX Inject 0.4 mLs (40 mg total) into the skin daily.   fexofenadine 180 MG tablet Commonly known as: ALLEGRA Take 180  mg by mouth daily as needed for allergies.   lisinopril 10 MG tablet Commonly known as: ZESTRIL Take 10 mg by mouth daily.   oxyCODONE-acetaminophen 5-325 MG tablet Commonly known as: PERCOCET/ROXICET Take 1 tablet by mouth every 4 (four) hours as needed for severe pain.   predniSONE 5 MG tablet Commonly known as: DELTASONE Take 1 tablet (5 mg total) by mouth daily as needed (asthma).   ProAir HFA 108 (90 Base) MCG/ACT inhaler Generic drug: albuterol Inhale 2 puffs into the lungs every 4 (four) hours as needed for wheezing. What changed: Another medication with the same name was removed. Continue taking  this medication, and follow the directions you see here.   Vitamin D-3 125 MCG (5000 UT) Tabs Take 5,000 Units by mouth daily.            Durable Medical Equipment  (From admission, onward)         Start     Ordered   10/02/19 1311  For home use only DME 3 n 1  Once     10/02/19 1310         Follow-up Information    Haddix, Gillie MannersKevin P, MD. Schedule an appointment as soon as possible for a visit in 2 weeks.   Specialty: Orthopedic Surgery Why: For wound re-check, For repeat x-rays Contact information: [address] [place] [address] [phone]          Allergies  Allergen Reactions  . Aspirin Hives  . Codeine Hives  . Fluticasone-Salmeterol Hives  . Montelukast Sodium Hives  . Adhesive [Tape] Rash  . Cephalexin Rash  . Ibuprofen Rash  . Latex Rash  . Penicillins Rash  . Statins Rash  . Sulfonamide Derivatives Rash    Consultations:  Orthopedic surgery   Procedures/Studies: DG Chest 1 View  Result Date: 09/30/2019 CLINICAL DATA:  Fall and pain EXAM: CHEST  1 VIEW COMPARISON:  None. FINDINGS: The heart size and mediastinal contours are within normal limits. Overlying median sternotomy wires. Aortic knob calcifications. Both lungs are clear. The visualized skeletal structures are unremarkable. IMPRESSION: No active disease. Electronically Signed   By: Jonna ClarkBindu  Avutu M.D.   On: 09/30/2019 23:44   DG Knee Complete 4 Views Left  Result Date: 09/30/2019 CLINICAL DATA:  Fall and pain EXAM: LEFT KNEE - COMPLETE 4+ VIEW COMPARISON:  None. FINDINGS: No evidence of fracture, dislocation, or joint effusion. 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 Dense vascular calcifications are seen. Surgical clips in the posterior knee. IMPRESSION: No acute osseous abnormality. Electronically Signed   By: Jonna ClarkBindu  Avutu M.D.   On: 09/30/2019 23:44   DG C-Arm 1-60 Min  Result Date: 10/01/2019 CLINICAL DATA:  Known left hip  fracture EXAM: OPERATIVE LEFT HIP WITH PELVIS; DG C-ARM 1-60 MIN COMPARISON:  Films from the previous day. FLUOROSCOPY TIME:  Radiation Exposure Index (as provided by the fluoroscopic device): Not available If the device does not provide the exposure index: Fluoroscopy Time:  1 minutes 17 seconds Number of Acquired Images:  9 FINDINGS: Initial images again demonstrate the left femoral neck fracture. Three fixation screws are noted traversing the femoral neck. Fracture fragments are in near anatomic alignment. IMPRESSION: ORIF of left femoral fracture. Electronically Signed   By: Alcide CleverMark  Lukens M.D.   On: 10/01/2019 13:46   DG HIP PORT UNILAT W OR W/O PELVIS 1V LEFT  Result Date: 10/01/2019 CLINICAL DATA:  Status post ORIF of left hip fracture EXAM: DG  HIP (WITH OR WITHOUT PELVIS) 1V PORT LEFT COMPARISON:  Film from the previous day. FINDINGS: Previously seen femoral neck fracture is again identified with 3 fixation screws traversing the fracture site. No other focal abnormality is noted. IMPRESSION: Status post ORIF of proximal left femoral fracture. Electronically Signed   By: Alcide Clever M.D.   On: 10/01/2019 14:16   DG HIP OPERATIVE UNILAT W OR W/O PELVIS LEFT  Result Date: 10/01/2019 CLINICAL DATA:  Known left hip fracture EXAM: OPERATIVE LEFT HIP WITH PELVIS; DG C-ARM 1-60 MIN COMPARISON:  Films from the previous day. FLUOROSCOPY TIME:  Radiation Exposure Index (as provided by the fluoroscopic device): Not available If the device does not provide the exposure index: Fluoroscopy Time:  1 minutes 17 seconds Number of Acquired Images:  9 FINDINGS: Initial images again demonstrate the left femoral neck fracture. Three fixation screws are noted traversing the femoral neck. Fracture fragments are in near anatomic alignment. IMPRESSION: ORIF of left femoral fracture. Electronically Signed   By: Alcide Clever M.D.   On: 10/01/2019 13:46   DG Hip Unilat W or Wo Pelvis 2-3 Views Left  Result Date:  09/30/2019 CLINICAL DATA:  Fall and pain EXAM: DG HIP (WITH OR WITHOUT PELVIS) 2-3V LEFT COMPARISON:  None. FINDINGS: There is a nondisplaced slightly impacted obliquely oriented left femoral neck fracture. The femoral head still well seated within the acetabulum. 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 Diffuse osteopenia. Degenerative changes in the lower lumbar spine. IMPRESSION: Nondisplaced obliquely oriented left femoral neck fracture. Electronically Signed   By: Jonna Clark M.D.   On: 09/30/2019 23:43   DG Femur Min 2 Views Left  Result Date: 09/30/2019 CLINICAL DATA:  Status post fall. EXAM: LEFT FEMUR 2 VIEWS COMPARISON:  None. FINDINGS: Acute nondisplaced fracture is seen extending through the neck of the proximal left femur. There is no evidence of an associated dislocation. Mild vascular calcification is noted. IMPRESSION: Acute fracture of the proximal left femur. Electronically Signed   By: Aram Candela M.D.   On: 09/30/2019 23:40     Discharge Exam: Vitals:   10/03/19 1504 10/03/19 1532  BP: 132/68 (!) 141/79  Pulse: (!) 51 63  Resp:  20  Temp:  98 F (36.7 C)  SpO2: 95% 96%    Exam General: Pt is alert, awake, not in acute distress Cardiovascular: RRR, S1/S2 +, no edema Respiratory: CTA bilaterally, no wheezing, no rhonchi, no respiratory distress, no conversational dyspnea  Abdominal: Soft, NT, ND, bowel sounds + Extremities: no edema, no cyanosis Psych: Normal mood and affect, stable judgement and insight     The results of significant diagnostics from this hospitalization (including imaging, microbiology, ancillary and laboratory) are listed below for reference.     Microbiology: Recent Results (from the past 240 hour(s))  Respiratory Panel by RT PCR (Flu A&B, Covid) - Nasopharyngeal Swab     Status: None   Collection Time: 10/01/19 12:07 AM   Specimen: Nasopharyngeal Swab  Result Value Ref Range Status   SARS Coronavirus 2 by RT PCR NEGATIVE  NEGATIVE Final    Comment: (NOTE) SARS-CoV-2 target nucleic acids are NOT DETECTED. The SARS-CoV-2 RNA is generally detectable in upper respiratoy specimens during the acute phase of infection. The lowest concentration of SARS-CoV-2 viral copies this assay can detect is 131 copies/mL. A negative result does not preclude SARS-Cov-2 infection and should not be used as the sole basis for treatment or other patient management decisions. A negative result may occur with  improper specimen collection/handling, submission of specimen other than nasopharyngeal swab, presence of viral mutation(s) within the areas targeted by this assay, and inadequate number of viral copies (<131 copies/mL). A negative result must be combined with clinical observations, patient history, and epidemiological information. The expected result is Negative. Fact Sheet for Patients:  PinkCheek.be Fact Sheet for Healthcare Providers:  GravelBags.it This test is not yet ap proved or cleared by the [place] FDA and  has been authorized for detection and/or diagnosis of SARS-CoV-2 by FDA under an Emergency Use Authorization (EUA). This EUA will remain  in effect (meaning this test can be used) for the duration of the COVID-19 declaration under Section 564(b)(1) of the Act, 21 U.S.C. section 360bbb-3(b)(1), unless the authorization is terminated or revoked sooner.    Influenza A by PCR NEGATIVE NEGATIVE Final   Influenza B by PCR NEGATIVE NEGATIVE Final    Comment: (NOTE) The Xpert Xpress SARS-CoV-2/FLU/RSV assay is intended as an aid in  the diagnosis of influenza from Nasopharyngeal swab specimens and  should not be used as a sole basis for treatment. Nasal washings and  aspirates are unacceptable for Xpert Xpress SARS-CoV-2/FLU/RSV  testing. Fact Sheet for Patients: PinkCheek.be Fact Sheet for Healthcare  Providers: GravelBags.it This test is not yet approved or cleared by the [place] FDA and  has been authorized for detection and/or diagnosis of SARS-CoV-2 by  FDA under an Emergency Use Authorization (EUA). This EUA will remain  in effect (meaning this test can be used) for the duration of the  Covid-19 declaration under Section 564(b)(1) of the Act, 21  U.S.C. section 360bbb-3(b)(1), unless the authorization is  terminated or revoked. Performed at [facility], [place] [address] [place], [place] [address]   Surgical pcr screen     Status: Abnormal   Collection Time: 10/01/19  8:09 AM   Specimen: Nasal Mucosa; Nasal Swab  Result Value Ref Range Status   MRSA, PCR NEGATIVE NEGATIVE Final   Staphylococcus aureus POSITIVE (A) NEGATIVE Final    Comment: (NOTE) The Xpert SA Assay (FDA approved for NASAL specimens in patients 29 years of age and older), is one component of a comprehensive surveillance program. It is not intended to diagnose infection nor to guide or monitor treatment. Performed at [facility], [place] [address] [place], [place] [address]      Labs: Basic Metabolic Panel: Recent Labs  Lab 10/01/19 0005 10/01/19 0642 10/02/19 0204 10/03/19 0153  NA 138 138 136 137  K 4.4 4.4 4.0 3.9  CL 104 103 101 104  CO2 23 25 23 25   GLUCOSE 128* 122* 134* 127*  BUN 20 19 17  26*  CREATININE 0.90 0.84 0.94 0.99  CALCIUM 8.8* 8.8* 8.8* 8.4*   Liver Function Tests: Recent Labs  Lab 10/01/19 0642  AST 21  ALT 21  ALKPHOS 62  BILITOT 0.8  PROT 6.8  ALBUMIN 3.3*   CBC: Recent Labs  Lab 10/01/19 0005 10/01/19 0642 10/02/19 0204 10/03/19 0153  WBC 7.7 8.5 6.9 6.9  NEUTROABS 5.1 6.8  --   --   HGB 12.4* 12.7* 12.6* 11.8*  HCT 37.9* 38.3* 37.5* 34.7*  MCV 96.7 95.5 94.2 94.0  PLT 188 184 183 174   Thyroid function studies Recent Labs    10/01/19 0642  TSH 1.974   Urinalysis    Component Value Date/Time    COLORURINE YELLOW 03/01/2010 [place] 03/01/2010 0757   LABSPEC 1.020 03/01/2010 0757  PHURINE 5.0 03/01/2010 0757   GLUCOSEU NEGATIVE 03/01/2010 0757   HGBUR NEGATIVE 03/01/2010 0757   BILIRUBINUR NEGATIVE 03/01/2010 0757   KETONESUR NEGATIVE 03/01/2010 0757   PROTEINUR NEGATIVE 03/01/2010 0757   UROBILINOGEN 0.2 03/01/2010 0757   NITRITE NEGATIVE 03/01/2010 0757   LEUKOCYTESUR  03/01/2010 0757    NEGATIVE MICROSCOPIC NOT DONE ON URINES WITH NEGATIVE PROTEIN, BLOOD, LEUKOCYTES, NITRITE, OR GLUCOSE <1000 mg/dL.   Microbiology Recent Results (from the past 240 hour(s))  Respiratory Panel by RT PCR (Flu A&B, Covid) - Nasopharyngeal Swab     Status: None   Collection Time: 10/01/19 12:07 AM   Specimen: Nasopharyngeal Swab  Result Value Ref Range Status   SARS Coronavirus 2 by RT PCR NEGATIVE NEGATIVE Final    Comment: (NOTE) SARS-CoV-2 target nucleic acids are NOT DETECTED. The SARS-CoV-2 RNA is generally detectable in upper respiratoy specimens during the acute phase of infection. The lowest concentration of SARS-CoV-2 viral copies this assay can detect is 131 copies/mL. A negative result does not preclude SARS-Cov-2 infection and should not be used as the sole basis for treatment or other patient management decisions. A negative result may occur with  improper specimen collection/handling, submission of specimen other than nasopharyngeal swab, presence of viral mutation(s) within the areas targeted by this assay, and inadequate number of viral copies (<131 copies/mL). A negative result must be combined with clinical observations, patient history, and epidemiological information. The expected result is Negative. Fact Sheet for Patients:  https://www.moore.com/ Fact Sheet for Healthcare Providers:  https://www.young.biz/ This test is not yet ap proved or cleared by the [place] FDA and  has been authorized for detection  and/or diagnosis of SARS-CoV-2 by FDA under an Emergency Use Authorization (EUA). This EUA will remain  in effect (meaning this test can be used) for the duration of the COVID-19 declaration under Section 564(b)(1) of the Act, 21 U.S.C. section 360bbb-3(b)(1), unless the authorization is terminated or revoked sooner.    Influenza A by PCR NEGATIVE NEGATIVE Final   Influenza B by PCR NEGATIVE NEGATIVE Final    Comment: (NOTE) The Xpert Xpress SARS-CoV-2/FLU/RSV assay is intended as an aid in  the diagnosis of influenza from Nasopharyngeal swab specimens and  should not be used as a sole basis for treatment. Nasal washings and  aspirates are unacceptable for Xpert Xpress SARS-CoV-2/FLU/RSV  testing. Fact Sheet for Patients: https://www.moore.com/ Fact Sheet for Healthcare Providers: https://www.young.biz/ This test is not yet approved or cleared by the [place] FDA and  has been authorized for detection and/or diagnosis of SARS-CoV-2 by  FDA under an Emergency Use Authorization (EUA). This EUA will remain  in effect (meaning this test can be used) for the duration of the  Covid-19 declaration under Section 564(b)(1) of the Act, 21  U.S.C. section 360bbb-3(b)(1), unless the authorization is  terminated or revoked. Performed at [facility], [address], [place], [place] [address]   Surgical pcr screen     Status: Abnormal   Collection Time: 10/01/19  8:09 AM   Specimen: Nasal Mucosa; Nasal Swab  Result Value Ref Range Status   MRSA, PCR NEGATIVE NEGATIVE Final   Staphylococcus aureus POSITIVE (A) NEGATIVE Final    Comment: (NOTE) The Xpert SA Assay (FDA approved for NASAL specimens in patients 65 years of age and older), is one component of a comprehensive surveillance program. It is not intended to diagnose infection nor to guide or monitor treatment. Performed at [facility], [address]  [place],  [place] [address]      Patient was seen and examined on the day of discharge and was found to be in stable condition. Time coordinating discharge: 35 minutes including assessment and coordination of care, as well as examination of the patient.   SIGNED:  Sharlene Dory, DO [facility] 10/03/2019, 3:42 PM

## 2019-10-03 NOTE — Progress Notes (Addendum)
Orthopaedic Trauma Progress Note  S: Doing well this morning.  Minimal to no pain. Worked well with therapies yesterday. Is hopeful to go home soon.  O:  Vitals:   10/03/19 0500 10/03/19 0826  BP: 135/69 (!) 108/54  Pulse: (!) 54 (!) 54  Resp: 16 18  Temp: (!) 97.5 F (36.4 C) 98.2 F (36.8 C)  SpO2: 94% 93%    General - Stting up in bedside chair, no acute distress Respiratory -  No increased work of breathing.  Left lower extremity - Dressing removed, incision clean, dry, intact.  No tenderness with palpation over hip, thigh, knee.  Able to actively flex the knee some without significant discomfort.  Ankle dorsiflexion and plantarflexion is intact.+EHL.+ FHL.  Compartments soft and compressible.  Neurovascularly intact  Imaging: Stable post op imaging.   Labs:  Results for orders placed or performed during the hospital encounter of 09/30/19 (from the past 24 hour(s))  Basic metabolic panel     Status: Abnormal   Collection Time: 10/03/19  1:53 AM  Result Value Ref Range   Sodium 137 135 - 145 mmol/L   Potassium 3.9 3.5 - 5.1 mmol/L   Chloride 104 98 - 111 mmol/L   CO2 25 22 - 32 mmol/L   Glucose, Bld 127 (H) 70 - 99 mg/dL   BUN 26 (H) 8 - 23 mg/dL   Creatinine, Ser 8.84 0.61 - 1.24 mg/dL   Calcium 8.4 (L) 8.9 - 10.3 mg/dL   GFR calc non Af Amer >60 >60 mL/min   GFR calc Af Amer >60 >60 mL/min   Anion gap 8 5 - 15  CBC     Status: Abnormal   Collection Time: 10/03/19  1:53 AM  Result Value Ref Range   WBC 6.9 4.0 - 10.5 K/uL   RBC 3.69 (L) 4.22 - 5.81 MIL/uL   Hemoglobin 11.8 (L) 13.0 - 17.0 g/dL   HCT 16.6 (L) 06.3 - 01.6 %   MCV 94.0 80.0 - 100.0 fL   MCH 32.0 26.0 - 34.0 pg   MCHC 34.0 30.0 - 36.0 g/dL   RDW 01.0 93.2 - 35.5 %   Platelets 174 150 - 400 K/uL   nRBC 0.0 0.0 - 0.2 %    Assessment: 79 year old male status post fall, 2 Days Post-Op   Injuries: Left femoral neck fracture s/p percutaneous fixation   Weightbearing: WBAT LLE  Insicional and  dressing care: Okay to leave open to air  Showering: Okay to begin showering 10/04/2019  Orthopedic device(s): None   CV/Blood loss: Hgb 11.8 this morning. Hemodynamically stable  Pain management:  1. Tylenol 500 mg twice daily 2. Percocet 5-3 25 q 4 hours PRN 3. Fentanyl 25 mcg q 2 hours PRN  VTE prophylaxis: Lovenox.  Should continue this for 30 days at discharge SCDs: Not currently in place, will be re-applied once patient returns to bed from chair  ID:  Ancef 2gm post op completed  Foley/Lines:  No foley, KVO IVFs  Medical co-morbidities: coronary artery disease, hypertension, aortic aneurysm, COPD  Impediments to Fracture Healing: Vitamin D level 22.  Continue Vit D3 supplementation.   Dispo: Up with therapies as tolerated. PT/OT recommend Home Health.  Hopefully home in the next 24-48 hours if therapy is going well.  Okay for discharge from ortho standpoint once cleared by medicine team and therapies D/C rx for pain meds, DVT prophylaxis, Vit D3 sent to pharmacy  Follow - up plan: 2 weeks  Contact information:  Caryn Bee  Haddix MD, Patrecia Pace PA-C   [facility] Mcelwain [facility] [phone] (office) [email or web address]

## 2019-10-03 NOTE — Progress Notes (Signed)
Penicillin Allergy Assessment   Penicillin Allergy History I spoke to Thomas [name] about his penicillin allergy of "rash" to find out more information about what happened when he took penicillin. He told me he did break out in a rash after taking penicillin and the rash was localized to his chest and arms. He thinks he was in his mid-40s when this happened. He did say he went to the emergency room for this reaction but this was only because he was nervous about the reaction. He did not have any shortness of breath, swelling of the face/tongue, or peeling/blistering of his skin. He said the reaction was resolved in the emergency department with some IV benadryl. He reports never taking penicillin or a penicillin antibiotic since this reaction to his knowledge.   Given his reaction and that it happened about 40 years ago it is likely he has lost his allergy to penicillin. Since he is low risk, we will challenge him with one dose of amoxicillin by mouth today to determine if he is still penicillin allergic. If he tolerates with no adverse reactions I will remove his allergy and contact his PCP and pharmacy to inform them he is no longer penicillin allergic. I also spoke with him about what this test means and if he tolerates it he can feel comfortable taking penicillin antibiotics in the future if he needs them. His nurse has agreed to monitor him every 15 minutes for the hour after.   Plan - Amoxicillin 500 mg x1 dose  - Vitals every 15 mins for the hour after  - Remove allergy if tolerated  Jettie Pagan, PharmD PGY2 Infectious Disease Pharmacy Resident

## 2019-10-03 NOTE — Progress Notes (Signed)
Physical Therapy Treatment Patient Details Name: Thomas [name] MRN: [id number] DOB: June 13, 1941 Today's Date: 10/03/2019    History of Present Illness 79 yo admitted s/p fall with left femur fx s/p percutaneous fixation. PMHx: CAD, HTN, COPD, AAA, polio    PT Comments    Pt in bed upon PT arrival, agreeable to PT session with focus on mobility progression and stair training prior to d/c. The pt continues to present with limitations in functional mobility, endurance, and dynamic stability compared to his prior level of function and independence due to above dx. However, the pt was able to demo good tolerance for ambulation and stairs during session, and will be safe to return home with supervision and HHPT. The pt will continue to benefit from skilled PT to maximize return to independence with mobility.     Follow Up Recommendations  Home health PT;Supervision for mobility/OOB     Equipment Recommendations  3in1 (PT)    Recommendations for Other Services       Precautions / Restrictions Precautions Precautions: Fall Restrictions Weight Bearing Restrictions: Yes LLE Weight Bearing: Weight bearing as tolerated    Mobility  Bed Mobility Overal bed mobility: Needs Assistance Bed Mobility: Supine to Sit     Supine to sit: Supervision     General bed mobility comments: no assist needed gettinginto or out of bed  Transfers Overall transfer level: Needs assistance Equipment used: Rolling walker (2 wheeled) Transfers: Sit to/from Stand Sit to Stand: Supervision         General transfer comment: supervision for safety  Ambulation/Gait Ambulation/Gait assistance: Min guard Gait Distance (Feet): 300 Feet Assistive device: Rolling walker (2 wheeled) Gait Pattern/deviations: Step-to pattern;Decreased stride length;Trunk flexed Gait velocity: 0.25 m/s Gait velocity interpretation: 1.31 - 2.62 ft/sec, indicative of limited community ambulator General Gait Details: cues  for posture, looking up and position in RW. Pt walked 150 ft to stairs, then 150 ft back   Stairs Stairs: Yes Stairs assistance: Min guard Stair Management: One rail Right;Step to pattern;Forwards Number of Stairs: 12 General stair comments: min guard for safety, no LOB   Wheelchair Mobility    Modified Rankin (Stroke Patients Only)       Balance Overall balance assessment: Needs assistance Sitting-balance support: Feet supported;No upper extremity supported Sitting balance-Leahy Scale: Good     Standing balance support: Bilateral upper extremity supported Standing balance-Leahy Scale: Fair Standing balance comment: able to static stand and reach witout UE support, reliant on BUE support for amb                            Cognition Arousal/Alertness: Awake/alert Behavior During Therapy: WFL for tasks assessed/performed Overall Cognitive Status: Within Functional Limits for tasks assessed                                        Exercises      General Comments        Pertinent Vitals/Pain Pain Assessment: Faces Faces Pain Scale: Hurts a little bit Pain Location: LLE with WB'ing initially Pain Descriptors / Indicators: Sore;Tightness Pain Intervention(s): Limited activity within patient's tolerance;Monitored during session;Repositioned    Home Living                      Prior Function            PT  Goals (current goals can now be found in the care plan section) Acute Rehab PT Goals Patient Stated Goal: return home PT Goal Formulation: With patient Time For Goal Achievement: 10/16/19 Potential to Achieve Goals: Good Progress towards PT goals: Progressing toward goals    Frequency    Min 5X/week      PT Plan Current plan remains appropriate    Co-evaluation              AM-PAC PT "6 Clicks" Mobility   Outcome Measure  Help needed turning from your back to your side while in a flat bed without using  bedrails?: A Little Help needed moving from lying on your back to sitting on the side of a flat bed without using bedrails?: A Little Help needed moving to and from a bed to a chair (including a wheelchair)?: A Little Help needed standing up from a chair using your arms (e.g., wheelchair or bedside chair)?: A Little Help needed to walk in hospital room?: A Little Help needed climbing 3-5 steps with a railing? : A Little 6 Click Score: 18    End of Session Equipment Utilized During Treatment: Gait belt Activity Tolerance: Patient tolerated treatment well Patient left: with call bell/phone within reach;in bed Nurse Communication: Mobility status Pain - Right/Left: Left Pain - part of body: Hip     Time: 7902-4097 PT Time Calculation (min) (ACUTE ONLY): 24 min  Charges:  $Gait Training: 23-37 mins                     Karma Ganja, PT, DPT   Acute Rehabilitation Department Pager #: [phone]   Otho Bellows 10/03/2019, 4:56 PM

## 2019-10-03 NOTE — TOC Transition Note (Signed)
Transition of Care [facility]) - CM/SW Discharge Note   Patient Details  Name: Thomas [name] MRN: [id number] Date of Birth: [birth year]  [facility] CM/SW Contact:  Doy Hutching, LCSW Phone Number: 10/03/2019, 1:57 PM   Clinical Narrative:    CSW spoke with pt at bedside accompanied by Tamera Punt, CSW shadowing this Clinical research associate. Reintroduced self, role, reason for visit. Pt preference is for [facility] which his wife had post her hip surgery. Pt did not pick another agency as back up but agreeable to this writer making other referrals should [facility] not be able to accept. CSW confirmed delivery of 3n1 at bedside. Pt wife and son will pick him up when discharged.   CSW called and made referral to [facility] with [facility], await whether or not [facility] agency can offer at this time.    Final next level of care: Home w Home Health Services Barriers to Discharge: Barriers Resolved   Patient Goals and CMS Choice Patient states their goals for this hospitalization and ongoing recovery are:: to return home with assistance from wife/HH services CMS Medicare.gov Compare Post Acute Care list provided to:: Patient Choice offered to / list presented to : Patient  Discharge Placement  Patient chooses bed at: (home) Patient to be transferred to facility by: personal vehicle Name of family member notified: pt will update his wife and son Patient and family notified of of transfer: 10/03/19  Discharge Plan and Services In-house Referral: Clinical Social Work Discharge Planning Services: [employer] Consult Post Acute Care Choice: Durable Medical Equipment, Home Health          DME Arranged: 3-N-1 DME Agency: [employer] Date DME Agency Contacted: 10/02/19   Representative spoke with at DME Agency: [facility] HH Arranged: PT, OT HH Agency: [facility] Date [facility] Agency Contacted: 10/03/19 Time HH Agency Contacted: [id number] Representative spoke with at [facility] Agency: Angela Adam  Readmission  Risk Interventions Readmission Risk Prevention Plan 10/02/2019  Post Dischage Appt Not Complete  Appt Comments disposition pending  Medication Screening Complete  Transportation Screening Complete  Some recent data might be hidden

## 2019-10-03 NOTE — Progress Notes (Signed)
Discharge instructions reviewed with pt and instructed on where to pick up prescriptions.  Pt verbalized understanding and had no questions.  Pt discharged in stable condition via wheelchair with his son.  Hector Shade [place]

## 2019-10-03 NOTE — Progress Notes (Signed)
Occupational Therapy Treatment Patient Details Name: Thomas [name] MRN: [id number] DOB: Jan 26, 1941 Today's Date: 10/03/2019    History of present illness 79 yo admitted s/p fall with left femur fx s/p percutaneous fixation. PMHx: CAD, HTN, COPD, AAA, polio   OT comments  Pt making good progress towards OT goals this session. Session focus on functional mobility and LB dressing techniques. Overall, pt requires min guard for functional mobility with RW and MIN A for sit<>stand from EOB needing cues for hand placement. Pt completed LB dressing from EOB needing MIN A to thread RLE into pants. Pt reports using reacher and sockaid at home and reports walkin shower at home with shower seat. Pt likely to DC home later today, will continue to follow acutely per POC.    Follow Up Recommendations  Home health OT;Supervision/Assistance - 24 hour    Equipment Recommendations  3 in 1 bedside commode    Recommendations for Other Services      Precautions / Restrictions Precautions Precautions: Fall Restrictions Weight Bearing Restrictions: Yes LLE Weight Bearing: Weight bearing as tolerated       Mobility Bed Mobility Overal bed mobility: Needs Assistance Bed Mobility: Supine to Sit     Supine to sit: Min guard;HOB elevated     General bed mobility comments: MIN guard for safety, use of bed rails and elevated HOB  Transfers Overall transfer level: Needs assistance Equipment used: Rolling walker (2 wheeled) Transfers: Sit to/from Stand Sit to Stand: Min guard         General transfer comment: min guard for safety, cues for hand placement    Balance Overall balance assessment: Needs assistance Sitting-balance support: Feet supported;No upper extremity supported Sitting balance-Leahy Scale: Fair Sitting balance - Comments: able to reach to feet to don pants with no LOB and supervision- min guard for safety   Standing balance support: Bilateral upper extremity  supported Standing balance-Leahy Scale: Poor Standing balance comment: reliant on BUE support                           ADL either performed or assessed with clinical judgement   ADL Overall ADL's : Needs assistance/impaired                     Lower Body Dressing: Minimal assistance;Sit to/from stand;Cueing for compensatory techniques Lower Body Dressing Details (indicate cue type and reason): MIN A to thread RLE into boxers, MIN A for cues to sequence compensatory method Toilet Transfer: Min guard;RW;Ambulation;Cueing for Chief of Staff Details (indicate cue type and reason): simulated via functional mobility with RW; MIN Guard for safety. cues for safety to push up from sitting surface vs RW       Tub/Shower Transfer Details (indicate cue type and reason): pt reports walkin shower at home with seat Functional mobility during ADLs: Min guard;Minimal assistance;Rolling walker General ADL Comments: session focus on LB dressing and functional mobility with RW     Vision       Perception     Praxis      Cognition Arousal/Alertness: Awake/alert Behavior During Therapy: WFL for tasks assessed/performed Overall Cognitive Status: Within Functional Limits for tasks assessed                                          Exercises     Shoulder Instructions  General Comments      Pertinent Vitals/ Pain       Pain Assessment: Faces Faces Pain Scale: Hurts a little bit Pain Location: LLE with WB'ing initially Pain Descriptors / Indicators: Sore;Tightness Pain Intervention(s): Monitored during session;Repositioned  Home Living Family/patient expects to be discharged to:: Private residence Living Arrangements: Spouse/significant other                                      Prior Functioning/Environment              Frequency  Min 2X/week        Progress Toward Goals  OT Goals(current goals can now be  found in the care plan section)  Progress towards OT goals: Progressing toward goals  Acute Rehab OT Goals Patient Stated Goal: return home OT Goal Formulation: With patient Time For Goal Achievement: 10/16/19 Potential to Achieve Goals: Good  Plan Discharge plan remains appropriate    Co-evaluation                 AM-PAC OT "6 Clicks" Daily Activity     Outcome Measure   Help from another person eating meals?: None Help from another person taking care of personal grooming?: A Little Help from another person toileting, which includes using toliet, bedpan, or urinal?: A Little Help from another person bathing (including washing, rinsing, drying)?: A Little Help from another person to put on and taking off regular upper body clothing?: A Little Help from another person to put on and taking off regular lower body clothing?: A Little 6 Click Score: 19    End of Session Equipment Utilized During Treatment: Rolling walker  OT Visit Diagnosis: Unsteadiness on feet (R26.81);Muscle weakness (generalized) (M62.81);History of falling (Z91.81);Pain Pain - Right/Left: Left   Activity Tolerance Patient tolerated treatment well   Patient Left in bed;with call bell/phone within reach   Nurse Communication          Time: 4580-9983 OT Time Calculation (min): 16 min  Charges: OT General Charges $OT Visit: 1 Visit OT Treatments $Self Care/Home Management : 8-22 mins  Lanier Clam., COTA/L Acute Rehabilitation Services [phone] [place] 10/03/2019, 11:21 AM

## 2019-10-04 ENCOUNTER — Encounter: Payer: Self-pay | Admitting: *Deleted

## 2019-10-07 ENCOUNTER — Emergency Department (HOSPITAL_COMMUNITY)
Admission: EM | Admit: 2019-10-07 | Discharge: 2019-10-07 | Disposition: A | Discharge status: Home / Self Care | Payer: Medicare Other | Attending: Emergency Medicine | Admitting: Emergency Medicine

## 2019-10-07 ENCOUNTER — Emergency Department (HOSPITAL_COMMUNITY): Payer: Medicare Other

## 2019-10-07 ENCOUNTER — Encounter (HOSPITAL_COMMUNITY): Payer: Self-pay | Admitting: *Deleted

## 2019-10-07 ENCOUNTER — Other Ambulatory Visit: Payer: Self-pay

## 2019-10-07 DIAGNOSIS — Z91040 Latex allergy status: Secondary | ICD-10-CM | POA: Insufficient documentation

## 2019-10-07 DIAGNOSIS — W0110XA Fall on same level from slipping, tripping and stumbling with subsequent striking against unspecified object, initial encounter: Secondary | ICD-10-CM | POA: Insufficient documentation

## 2019-10-07 DIAGNOSIS — Z7982 Long term (current) use of aspirin: Secondary | ICD-10-CM | POA: Insufficient documentation

## 2019-10-07 DIAGNOSIS — Z7901 Long term (current) use of anticoagulants: Secondary | ICD-10-CM | POA: Diagnosis not present

## 2019-10-07 DIAGNOSIS — Z87891 Personal history of nicotine dependence: Secondary | ICD-10-CM | POA: Diagnosis not present

## 2019-10-07 DIAGNOSIS — S51012A Laceration without foreign body of left elbow, initial encounter: Secondary | ICD-10-CM

## 2019-10-07 DIAGNOSIS — I2510 Atherosclerotic heart disease of native coronary artery without angina pectoris: Secondary | ICD-10-CM | POA: Insufficient documentation

## 2019-10-07 DIAGNOSIS — Y999 Unspecified external cause status: Secondary | ICD-10-CM | POA: Diagnosis not present

## 2019-10-07 DIAGNOSIS — M79602 Pain in left arm: Secondary | ICD-10-CM

## 2019-10-07 DIAGNOSIS — I10 Essential (primary) hypertension: Secondary | ICD-10-CM | POA: Diagnosis not present

## 2019-10-07 DIAGNOSIS — W19XXXA Unspecified fall, initial encounter: Secondary | ICD-10-CM

## 2019-10-07 DIAGNOSIS — Y9301 Activity, walking, marching and hiking: Secondary | ICD-10-CM | POA: Diagnosis not present

## 2019-10-07 DIAGNOSIS — M79622 Pain in left upper arm: Secondary | ICD-10-CM | POA: Insufficient documentation

## 2019-10-07 DIAGNOSIS — S59902A Unspecified injury of left elbow, initial encounter: Secondary | ICD-10-CM | POA: Diagnosis present

## 2019-10-07 DIAGNOSIS — Y92009 Unspecified place in unspecified non-institutional (private) residence as the place of occurrence of the external cause: Secondary | ICD-10-CM | POA: Diagnosis not present

## 2019-10-07 HISTORY — DX: Personal history of other infectious and parasitic diseases: Z86.19

## 2019-10-07 NOTE — ED Triage Notes (Signed)
Pt c/o fall today while using his walker. Pt reports he fell to the left and injured his elbow. Pt denies hip pain but does report he just had screws put in his hip 3 days ago. Denies hitting his head. Pt currently using Lovenox after his recent surgery.

## 2019-10-07 NOTE — ED Notes (Signed)
Transported to xray 

## 2019-10-07 NOTE — ED Provider Notes (Signed)
[facility] EMERGENCY DEPARTMENT Provider Note   CSN: [id number] Arrival date & time: 10/07/19  1248     History Chief Complaint  Patient presents with  . Fall    Thomas [name] is a 79 y.o. male with a history of CAD, hypertension, hyperlipidemia, AAA, & COPD who presents to the ED s/p fall shortly PTA with complaints of L elbow pain.  Per chart review & confirmed via patient history patient had recent fall which lead to L hip fx s/p OR 10/01/19, discharged home with physical therapy home health. Has been doing well post op. Today was ambulating with his walker when he, miss-stepped, and fell onto his left elbow. Denies head injury or LOC. Was able to get up without assistance and has been ambulatory post fall. States only injury is to L elbow, associated skin tear, no alleviating/aggravating factors. Last tetanus was 2019. Denies fever, chills, numbness, weakness, headache, neck pain, or back pain. Denies pre fall or current chest pain, dyspnea, dizziness, or lightheadedness.  Patient is on Lovenox postop.  HPI     Past Medical History:  Diagnosis Date  . AAA (abdominal aortic aneurysm) (HCC)    4.2 cm by CTA 10/01/13- states "much smaller than first expected"Dr. Laurann Montana follows-checks every 2 yrs  . Allergic asthma   . Allergic rhinitis   . Arthritis    torn rotator cuff left shoulder  . Bruise    left chest area"recent fall"-no rib fracture identified.  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 Colitis   . GERD (gastroesophageal reflux disease)   . History of measles   . History of mumps   . HLD (hyperlipidemia)   . HTN (hypertension)   . Obesity   . Polio    left leg  . Pupil irregularity, right    old injury"pupil will not contract to light"  . Shortness of breath    exertion, asthma attack    Patient Active Problem List   Diagnosis Date Noted  . Closed left hip fracture, initial encounter ([place]) 10/01/2019  . Hip fracture ([place]) 10/01/2019  .  Nocturnal leg cramps 09/27/2014  . S/P lumbar microdiscectomy 11/07/2013  . Lumbar disc disease 10/29/2013  . CAD (coronary artery disease)   . Obesity (BMI 30-39.9)   . Pupil diameter unequal   . Hyperlipidemia   . Hypertensive heart disease   . COPD mixed type ([place]) 01/01/2008  . Esophageal reflux 01/01/2008  . Seasonal and perennial allergic rhinitis     Past Surgical History:  Procedure Laterality Date  . CARDIAC CATHETERIZATION     2006- 1 week prior tp CABPG  . CATARACT EXTRACTION    . COLONOSCOPY WITH PROPOFOL N/A 04/27/2016   Procedure: COLONOSCOPY WITH PROPOFOL;  Surgeon: Garlan Fair, MD;  Location: [facility];  Service: Endoscopy;  Laterality: N/A;  . CORONARY ARTERY BYPASS GRAFT  3/06   x4 vessel bypass [facility]-No problems since.  Marland Kitchen EYE SURGERY     rebuild eye socket (R)  . HIP PINNING,CANNULATED Left 10/01/2019  . HIP PINNING,CANNULATED Left 10/01/2019   Procedure: CANNULATED HIP PINNING;  Surgeon: Shona Needles, MD;  Location: [place];  Service: Orthopedics;  Laterality: Left;  . KNEE ARTHROSCOPY Left    left knee scope  . [place] SURGERY  2011  . LUMBAR LAMINECTOMY/DECOMPRESSION MICRODISCECTOMY Left 11/07/2013   Procedure: LUMBAR LAMINECTOMY/DECOMPRESSION MICRODISCECTOMY 1 LEVEL lumbar two/three;  Surgeon: Eustace Moore, MD;  Location: [facility];  Service:  Neurosurgery;  Laterality: Left;  . NASAL SINUS SURGERY         Family History  Problem Relation Age of Onset  . Alzheimer's disease Mother   . Liver disease Sister   . Lung cancer Brother     Social History   Tobacco Use  . Smoking status: Former Smoker    Packs/day: 1.00    Years: 37.00    Pack years: 37.00    Types: Cigarettes    Quit date: 08/23/1992    Years since quitting: 27.1  . Smokeless tobacco: Never Used  Substance Use Topics  . Alcohol use: Not Currently    Comment: socially   . Drug use: No    Home Medications Prior to Admission medications   Medication Sig Start Date  End Date Taking? Authorizing Provider  aspirin EC 81 MG tablet Take 81 mg by mouth daily.    [provider]  Cholecalciferol (VITAMIN D-3) 125 MCG (5000 UT) TABS Take 5,000 Units by mouth daily. 10/03/19   Despina Hidden, PA-C  DULERA 200-5 MCG/ACT AERO Inhale 2 puffs into the lungs 2 (two) times daily. 05/29/19   [provider]  enoxaparin (LOVENOX) 40 MG/0.4ML injection Inject 0.4 mLs (40 mg total) into the skin daily. 10/03/19 11/02/19  Despina Hidden, PA-C  fexofenadine (ALLEGRA) 180 MG tablet Take 180 mg by mouth daily as needed for allergies.     [provider]  lisinopril (PRINIVIL,ZESTRIL) 10 MG tablet Take 10 mg by mouth daily.     [provider]  oxyCODONE-acetaminophen (PERCOCET/ROXICET) 5-325 MG tablet Take 1 tablet by mouth every 4 (four) hours as needed for severe pain. 10/02/19   Despina Hidden, PA-C  predniSONE (DELTASONE) 5 MG tablet Take 1 tablet (5 mg total) by mouth daily as needed (asthma). Patient not taking: Reported on 10/01/2019 12/10/14   Waymon Budge, MD  [facility] HFA 108 [id number] Base) MCG/ACT inhaler Inhale 2 puffs into the lungs every 4 (four) hours as needed for wheezing.  09/19/19   [provider]    Allergies    Aspirin, Codeine, Fluticasone-salmeterol, Montelukast sodium, Adhesive [tape], Cephalexin, Ibuprofen, Latex, Statins, and Sulfonamide derivatives  Review of Systems   Review of Systems  Constitutional: Negative for fever.  Respiratory: Negative for shortness of breath.   Cardiovascular: Negative for chest pain.  Gastrointestinal: Negative for abdominal pain.  Musculoskeletal: Positive for arthralgias. Negative for back pain and neck pain.  Skin: Positive for wound.  Neurological: Negative for dizziness, weakness, light-headedness, numbness and headaches.  All other systems reviewed and are negative.   Physical Exam Updated Vital Signs BP 127/82 (BP Location: Left Arm)   Pulse 73   Temp (!) 97.5 F (36.4  C) (Oral)   Resp 18   Ht 5\' 8"  (1.727 m)   Wt 83.5 kg   SpO2 97%   BMI 27.98 kg/m   Physical Exam Vitals and nursing note reviewed.  Constitutional:      General: He is not in acute distress.    Appearance: He is well-developed. He is not toxic-appearing.  HENT:     Head: Normocephalic and atraumatic.     Comments: No raccoon eyes or battle sign. Eyes:     General:        Right eye: No discharge.        Left eye: No discharge.     Extraocular Movements: Extraocular movements intact.     Conjunctiva/sclera: Conjunctivae normal.     Comments: Pupils  are asymmetric right somewhat dilated compared to left.  Patient states this is baseline from an injury several years ago.  Cardiovascular:     Rate and Rhythm: Normal rate and regular rhythm.     Comments: 2+ metric radial pulses and PT pulses. Pulmonary:     Effort: Pulmonary effort is normal. No respiratory distress.     Breath sounds: Normal breath sounds. No wheezing, rhonchi or rales.  Chest:     Chest wall: No tenderness.  Abdominal:     General: There is no distension.     Palpations: Abdomen is soft.     Tenderness: There is no abdominal tenderness. There is no guarding or rebound.  Musculoskeletal:     Cervical back: Neck supple.     Comments: Upper extremity: Patient has a 5 cm skin tear to the dorsal ulnar portion of the left elbow.  No active bleeding or appreciable foreign bodies.  Patient does have some bruising to the elbow as well.  Patient has intact active range of motion throughout the upper extremities.  Patient is tender palpation to the left medial condyle, and proximal one third of the forearm to the left upper extremity.  Upper extremities are otherwise nontender. Back: No midline tenderness or palpable step-off Lower extremities: Appropriately healing left hip incision site without erythema, warmth, or drainage.  Patient has some old appearing ecchymosis to the left hip/lateral thigh area.  Patient has  intact active range of motion throughout the lower extremities.  No focal bony tenderness throughout the lower extremities including none to the left hip  Skin:    General: Skin is warm and dry.     Findings: No rash.  Neurological:     Mental Status: He is alert.     Comments: Clear speech.  CN III through XII grossly intact with the exception of pupil asymmetry as mentioned above.  Sensation grossly intact bilateral upper and lower extremities.  5 out of 5 symmetric grip strength.  5 out of 5 strength with plantar dorsiflexion bilaterally.  Patient is ambulatory.  Psychiatric:        Behavior: Behavior normal.     ED Results / Procedures / Treatments   Labs (all labs ordered are listed, but only abnormal results are displayed) Labs Reviewed - No data to display  EKG None  Radiology DG Elbow Complete Left  Result Date: 10/07/2019 CLINICAL DATA:  Status post fall with left arm pain. EXAM: LEFT ELBOW - COMPLETE 3+ VIEW COMPARISON:  None. FINDINGS: There is no evidence of fracture, dislocation, or joint effusion. There is no evidence of arthropathy or other focal bone abnormality. Soft tissues are unremarkable. IMPRESSION: Negative. Electronically Signed   By: Sherian Rein M.D.   On: 10/07/2019 14:08   DG Forearm Left  Result Date: 10/07/2019 CLINICAL DATA:  Status post fall with left arm pain. EXAM: LEFT FOREARM - 2 VIEW COMPARISON:  None. FINDINGS: There is no evidence of fracture or other focal bone lesions. Soft tissues are unremarkable. IMPRESSION: Negative. Electronically Signed   By: Sherian Rein M.D.   On: 10/07/2019 14:07    Procedures Procedures (including critical care time)  Medications Ordered in ED Medications - No data to display  ED Course  I have reviewed the triage vital signs and the nursing notes.  Pertinent labs & imaging results that were available during my care of the patient were reviewed by me and considered in my medical decision making (see chart for  details).  MDM Rules/Calculators/A&P                      Patient presents status post mechanical fall with left elbow injury. No signs of serious head, neck, or back injury.  No reports of head injury or loss of consciousness, no focal neurologic deficits, no midline spinal tenderness to palpation.  Do not suspect head bleed or spinal fracture/subluxation.  Chest/abdomen are also nontender therefore do not suspect acute intrathoracic/abdominal injury.  Seems to have isolated injury to the left elbow.  Skin tear wound care was performed with bandage application, does not appear amenable to repair with sutures or skin adhesive.  Tetanus is up-to-date.  X-ray without fracture dislocation.  Patient is neurovascularly intact distally.  Left hip appears appropriate postop, no signs of infection to incision site, good range of motion and nontender do not suspect acute injury.  Overall patient appears appropriate for discharge home. I discussed results, treatment plan, need for follow-up, and return precautions with the patient. Provided opportunity for questions, patient confirmed understanding and is in agreement with plan.   Findings and plan of care discussed with supervising physician Dr. Jacqulyn Bath who has evaluated patient & is in agreement.    Final Clinical Impression(s) / ED Diagnoses Final diagnoses:  Fall, initial encounter  Left arm pain  Skin tear of left elbow without complication, initial encounter    Rx / DC Orders ED Discharge Orders    None       Desmond Lope 10/07/19 1503    Maia Plan, MD 10/10/19 2104

## 2019-10-07 NOTE — Discharge Instructions (Signed)
You were seen in the emergency room today after a fall.  The x-rays of your arm and elbow showed no fracture.  You have a skin tear to the left elbow which has been bandaged.  You can change the dressing every 24 hours.  I have applied a nonstick dressing for today but after today you can simply wrapped gauze loosely on the wound.  Change the dressing if it gets wet or you notice bleeding.  The underlying skin will remain in place but will likely not reattach.  It will eventually come off on its own but removing it can increase chances of infection.  Follow-up with your primary care doctor regarding your falling and return to the emergency department any new or suddenly worsening symptoms.

## 2019-10-17 ENCOUNTER — Other Ambulatory Visit: Payer: Self-pay

## 2019-10-17 DIAGNOSIS — I714 Abdominal aortic aneurysm, without rupture, unspecified: Secondary | ICD-10-CM

## 2019-10-18 ENCOUNTER — Ambulatory Visit (HOSPITAL_COMMUNITY)
Admission: RE | Admit: 2019-10-18 | Discharge: 2019-10-18 | Disposition: A | Discharge status: Home / Self Care | Payer: Medicare Other | Source: Ambulatory Visit | Attending: Surgery | Admitting: Surgery

## 2019-10-18 ENCOUNTER — Ambulatory Visit (INDEPENDENT_AMBULATORY_CARE_PROVIDER_SITE_OTHER): Payer: Medicare Other | Admitting: Physician Assistant

## 2019-10-18 ENCOUNTER — Other Ambulatory Visit: Payer: Self-pay

## 2019-10-18 DIAGNOSIS — I714 Abdominal aortic aneurysm, without rupture, unspecified: Secondary | ICD-10-CM

## 2019-10-18 NOTE — Progress Notes (Signed)
    Established Abdominal Aortic Aneurysm   History of Present Illness   Thomas [name] is a 79 y.o. (1941/08/10) male who presents with chief complaint: follow up for AAA.  Previous studies demonstrate an AAA, measuring 4.7 cm.  The patient denies new or changing back or abdominal pain.  He also denies any claudication, rest pain, or toe discoloration of bilateral lower extremities.  2 weeks ago he sustained a fall and underwent surgery for fractured left hip.  Patient states he is doing well with home health physical therapy.  The patient's PMH, PSH, SH, and FamHx were reviewed and are unchanged from prior visit.  Current Outpatient Medications  Medication Sig Dispense Refill  . aspirin EC 81 MG tablet Take 81 mg by mouth daily.    . Cholecalciferol (VITAMIN D-3) 125 MCG (5000 UT) TABS Take 5,000 Units by mouth daily. 90 tablet 1  . DULERA 200-5 MCG/ACT AERO Inhale 2 puffs into the lungs 2 (two) times daily.    . fexofenadine (ALLEGRA) 180 MG tablet Take 180 mg by mouth daily as needed for allergies.     Marland Kitchen lisinopril (PRINIVIL,ZESTRIL) 10 MG tablet Take 10 mg by mouth daily.     Marland Kitchen oxyCODONE-acetaminophen (PERCOCET/ROXICET) 5-325 MG tablet Take 1 tablet by mouth every 4 (four) hours as needed for severe pain. 42 tablet 0  . predniSONE (DELTASONE) 5 MG tablet Take 1 tablet (5 mg total) by mouth daily as needed (asthma). 30 tablet 5  . PROAIR HFA 108 (90 Base) MCG/ACT inhaler Inhale 2 puffs into the lungs every 4 (four) hours as needed for wheezing.     . enoxaparin (LOVENOX) 40 MG/0.4ML injection Inject 0.4 mLs (40 mg total) into the skin daily. 12 mL 0   No current facility-administered medications for this visit.    On ROS today: 10 system negative unless otherwise noted   Physical Examination   Vitals:   10/18/19 0953  BP: 128/84  Pulse: (!) 58  Resp: 20  Temp: (!) 97.3 F (36.3 C)  TempSrc: Temporal  SpO2: 97%  Weight: 188 lb (85.3 kg)  Height: 5\' 8"  (1.727 m)   Body  mass index is 28.59 kg/m.  General Alert, O x 3, WD, NAD  Pulmonary Sym exp, good B air movt,  Cardiac RRR, Nl S1, S2  Gastro- intestinal soft, non-distended, non-tender to palpation,   Musculo- skeletal M/S 5/5 throughout  , Extremities without ischemic changes  , No edema present, No visible varicosities , No Lipodermatosclerosis present; feet symmetrically warm to touch with good capillary refill  Neurologic A&O; CN grossly intact     Non-Invasive Vascular Imaging   AAA Duplex   Current size: 4.8 cm  Previous size: 4.7 cm   R CIA: 1.4 cm  L CIA: 1.2 cm   Medical Decision Making   Thomas [name] is a 79 y.o. (07-21-1941) male who presents for surveillance of asymptomatic AAA   AAA essentially unchanged over the past 6 months now measuring 4.8cm at largest diameter  Recheck duplex in 6 months  Continue aspirin daily   12/22/1940 PA-C [facility] Office: [phone]  Clinic MD: Dr. [phone]

## 2019-10-19 ENCOUNTER — Other Ambulatory Visit: Payer: Self-pay | Admitting: *Deleted

## 2019-10-19 DIAGNOSIS — I714 Abdominal aortic aneurysm, without rupture, unspecified: Secondary | ICD-10-CM

## 2019-12-18 ENCOUNTER — Ambulatory Visit (HOSPITAL_COMMUNITY): Payer: Medicare Other | Attending: Student | Admitting: Physical Therapy

## 2019-12-18 ENCOUNTER — Encounter (HOSPITAL_COMMUNITY): Payer: Self-pay | Admitting: Physical Therapy

## 2019-12-18 ENCOUNTER — Other Ambulatory Visit: Payer: Self-pay

## 2019-12-18 DIAGNOSIS — M25552 Pain in left hip: Secondary | ICD-10-CM | POA: Insufficient documentation

## 2019-12-18 DIAGNOSIS — R2689 Other abnormalities of gait and mobility: Secondary | ICD-10-CM | POA: Diagnosis present

## 2019-12-18 NOTE — Therapy (Signed)
[facility] [address] [place], [place], [address] Phone: [phone]   Fax:  [phone]  Physical Therapy Evaluation  Patient Details  Name: Thomas [name] MRN: [id number] Date of Birth: 03-Sep-1940 Referring Provider (PT): Ulyses Southward PA-C   Encounter Date: 12/18/2019  PT End of Session - 12/18/19 1725    Visit Number  1    Number of Visits  12    Date for PT Re-Evaluation  02/01/20    Authorization Type  UHC Medicare    Progress Note Due on Visit  10    PT Start Time  1645    PT Stop Time  1730    PT Time Calculation (min)  45 min    Equipment Utilized During Treatment  Gait belt    Activity Tolerance  Patient tolerated treatment well    Behavior During Therapy  [facility] for tasks assessed/performed       Past Medical History:  Diagnosis Date  . AAA (abdominal aortic aneurysm) (HCC)    4.2 cm by CTA 10/01/13- states "much smaller than first expected"Dr. Valentina Lucks follows-checks every 2 yrs  . Allergic asthma   . Allergic rhinitis   . Arthritis    torn rotator cuff left shoulder  . Bruise    left chest area"recent fall"-no rib fracture identified.  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 Colitis   . GERD (gastroesophageal reflux disease)   . History of measles   . History of mumps   . HLD (hyperlipidemia)   . HTN (hypertension)   . Obesity   . Polio    left leg  . Pupil irregularity, right    old injury"pupil will not contract to light"  . Shortness of breath    exertion, asthma attack    Past Surgical History:  Procedure Laterality Date  . CARDIAC CATHETERIZATION     2006- 1 week prior tp CABPG  . CATARACT EXTRACTION    . COLONOSCOPY WITH PROPOFOL N/A 04/27/2016   Procedure: COLONOSCOPY WITH PROPOFOL;  Surgeon: Charolett Bumpers, MD;  Location: [facility];  Service: Endoscopy;  Laterality: N/A;  . CORONARY ARTERY BYPASS GRAFT  3/06   x4 vessel bypass [facility]-No problems since.  Marland Kitchen EYE SURGERY     rebuild eye socket (R)  . HIP PINNING,CANNULATED Left 10/01/2019  . HIP PINNING,CANNULATED Left 10/01/2019   Procedure: CANNULATED HIP PINNING;  Surgeon: Roby Lofts, MD;  Location: [facility] OR;  Service: Orthopedics;  Laterality: Left;  . KNEE ARTHROSCOPY Left    left knee scope  . LUMBAR DISC SURGERY  2011  . LUMBAR LAMINECTOMY/DECOMPRESSION MICRODISCECTOMY Left 11/07/2013   Procedure: LUMBAR LAMINECTOMY/DECOMPRESSION MICRODISCECTOMY 1 LEVEL lumbar two/three;  Surgeon: Tia Alert, MD;  Location: [facility];  Service: Neurosurgery;  Laterality: Left;  . NASAL SINUS SURGERY      There were no vitals filed for this visit.   Subjective Assessment - 12/18/19 1653    Subjective  Patient presents to physical therapy with complaint of LT hip pain. Patient says he fell on 10/01/19 at his home resulting in LT hip fracture. Patient had ORIF repair for LT femoral neck fracture that following Tuesday. Says he was doing pretty well, but then fell again 2 days after leaving hospital. Patient was readmitted but had no new injury. Patient says he noted redness and swelling at LT hip and was diagnosed with infection about 10 days after leaving the hospital. Says he followed  up with MD and was put on antibiotics. Says he is doing better now and infection seems to be going away. Says pain has not been too bad, still some soreness in LT thigh, but is doing better with walking. Says he still has discomfort sitting for long periods.    Pertinent History  Hx of polio; known LLD L<R    Limitations  Lifting;Standing;Walking;House hold activities    How long can you stand comfortably?  30 min with walker    How long can you walk comfortably?  can walk all he wants with walker    Patient Stated Goals  Walk safely with cane    Currently in Pain?  No/denies         [facility] PT Assessment - 12/18/19 0001      Assessment   Medical Diagnosis  Percutaneous Fixation LT hip     Referring Provider (PT)  Ulyses Southward PA-C     Onset Date/Surgical Date  --   10/03/19   Next MD Visit  12/27/19    Prior Therapy  3 HH sessions       Precautions   Precautions  Fall      Restrictions   Weight Bearing Restrictions  No      Balance Screen   Has the patient fallen in the past 6 months  Yes    How many times?  2    Has the patient had a decrease in activity level because of a fear of falling?   Yes    Is the patient reluctant to leave their home because of a fear of falling?   No      Home Environment   Living Environment  Private residence    Living Arrangements  Spouse/significant other    Type of Home  House    Home Access  Stairs to enter    Entrance Stairs-Number of Steps  5    Entrance Stairs-Rails  Right    Home Layout  One level      Prior Function   Level of Independence  Independent with basic ADLs      Cognition   Overall Cognitive Status  Within Functional Limits for tasks assessed      Observation/Other Assessments   Focus on Therapeutic Outcomes (FOTO)   48% limited       Sensation   Light Touch  Appears Intact      Palpation   Palpation comment  nonoted tenderness to palpation       Ambulation/Gait   Ambulation/Gait  Yes    Ambulation/Gait Assistance  6: Modified independent (Device/Increase time)    Ambulation Distance (Feet)  340 Feet    Assistive device  Rolling walker    Gait Pattern  Decreased step length - right;Decreased step length - left;Decreased stance time - left;Decreased stride length;Poor foot clearance - left;Trunk flexed    Ambulation Surface  Level;Indoor    Gait Comments       Balance   Balance Assessed  Yes      Static Standing Balance   Static Standing Balance -  Activities   Tandam Stance - Right Leg;Tandam Stance - Left Leg    Static Standing - Comment/# of Minutes  7 sec; 2 sec                 Objective measurements completed on examination: See above findings.              PT Education - 12/18/19  [place]    Education Details  On  evaluation findings and POC    Person(s) Educated  Patient    Methods  Explanation    Comprehension  Verbalized understanding       PT Short Term Goals - 12/18/19 1729      PT SHORT TERM GOAL #1   Title  Patient will be independent with initial HEP to improve functional outcomes    Time  3    Period  Weeks    Status  New    Target Date  01/11/20      PT SHORT TERM GOAL #2   Title  Patient will reports no falls since starting therapy to demonstrate improved balance and functional outcomes.    Time  3    Period  Weeks    Status  New    Target Date  01/11/20        PT Long Term Goals - 12/18/19 1730      PT LONG TERM GOAL #1   Title  Patient will be able to maintain tandem stance >30 seconds on BLEs to improve stability and reduce risk for falls    Time  6    Period  Weeks    Status  New    Target Date  02/01/20      PT LONG TERM GOAL #2   Title  Patient will improve FOTO score to < 30% to indicate improvement in functional outcomes    Time  6    Period  Weeks    Status  New    Target Date  02/01/20      PT LONG TERM GOAL #3   Title  Patient will have equal to or > 4+/5 MMT throughout BLE to improve ability to perform functional mobility, stair ambulation and ADLs.    Time  6    Period  Weeks    Status  New    Target Date  02/01/20      PT LONG TERM GOAL #4   Title  Patient will be able to ambulate at least 400 feet during 2MWT with LRAD to demonstrate improved ability to perform functional mobility and associated tasks.    Time  6    Period  Weeks    Status  New    Target Date  02/01/20             Plan - 12/18/19 1726    Clinical Impression Statement  Patient is a 79 y.o. Male who presents to physical therapy with complaint of Lt hip pain and history of falls. Patient demonstrates decreased strength, balance deficits and gait abnormalities which are negatively impacting patient ability to perform ADLs and functional mobility tasks. Patient will benefit  from skilled physical therapy services to address these deficits to improve level of function with ADLs, functional mobility tasks, and reduce risk for falls.    Personal Factors and Comorbidities  Age    Examination-Activity Limitations  Lift;Stand;Locomotion Level;Stairs;Squat;Hygiene/Grooming;Dressing;Transfers    Examination-Participation Restrictions  Yard Work;Cleaning;Community Activity;Laundry    Stability/Clinical Decision Making  Stable/Uncomplicated    Clinical Decision Making  Low    Rehab Potential  Good    PT Frequency  2x / week    PT Duration  6 weeks    PT Treatment/Interventions  ADLs/Self Care Home Management;Aquatic Therapy;Biofeedback;Cryotherapy;Electrical Stimulation;Contrast Bath;Therapeutic exercise;Orthotic Fit/Training;Compression bandaging;Manual lymph drainage;Splinting;Taping;Vasopneumatic Device;Joint Manipulations;Spinal Manipulations;Dry needling;Energy conservation;Manual techniques;Patient/family education;Therapeutic activities;Functional mobility training;Traction;Ultrasound;Parrafin;Fluidtherapy;Moist Heat;Stair training;Gait training;Iontophoresis 4mg /ml Dexamethasone;DME Instruction;Balance training;Scar mobilization;Vestibular;Passive range of motion;Wheelchair  mobility training;Neuromuscular re-education    PT Next Visit Plan  Review goals, issue HEP. HEP to consist of sit to stand, heel raise, staggered standing at sink. Progress standing hip strength, balance, and endurance as able with foucs on functional activity.    PT Home Exercise Plan  Issue at next visit    Consulted and Agree with Plan of Care  Patient       Patient will benefit from skilled therapeutic intervention in order to improve the following deficits and impairments:  Pain, Improper body mechanics, Abnormal gait, Decreased mobility, Decreased activity tolerance, Decreased endurance, Decreased range of motion, Decreased strength, Hypomobility, Decreased balance, Difficulty walking, Impaired  flexibility  Visit Diagnosis: Pain in left hip  Other abnormalities of gait and mobility     Problem List Patient Active Problem List   Diagnosis Date Noted  . AAA (abdominal aortic aneurysm) without rupture (HCC) 10/18/2019  . Closed left hip fracture, initial encounter (HCC) 10/01/2019  . Hip fracture (HCC) 10/01/2019  . Nocturnal leg cramps 09/27/2014  . S/P lumbar microdiscectomy 11/07/2013  . Lumbar disc disease 10/29/2013  . CAD (coronary artery disease)   . Obesity (BMI 30-39.9)   . Pupil diameter unequal   . Hyperlipidemia   . Hypertensive heart disease   . 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 Esophageal reflux 01/01/2008  . Seasonal and perennial allergic rhinitis    6:27 PM, 12/18/19 Georges Lynch PT DPT  Physical Therapist with [facility]  [facility]  [phone]   [facility] [address] [place], [place], [address] Phone: [phone]   Fax:  [phone]  Name: Thomas [name] MRN: [id number] Date of Birth: [birth year]

## 2019-12-19 ENCOUNTER — Encounter (HOSPITAL_COMMUNITY): Payer: Self-pay | Admitting: Physical Therapy

## 2019-12-19 ENCOUNTER — Ambulatory Visit (HOSPITAL_COMMUNITY): Payer: Medicare Other | Admitting: Physical Therapy

## 2019-12-19 DIAGNOSIS — M25552 Pain in left hip: Secondary | ICD-10-CM | POA: Diagnosis not present

## 2019-12-19 DIAGNOSIS — R2689 Other abnormalities of gait and mobility: Secondary | ICD-10-CM

## 2019-12-19 NOTE — Therapy (Signed)
[place], [place], [address] Phone: [phone]   Fax:  [phone]  Physical Therapy Treatment  Patient Details  Name: Thomas [name] MRN: [id number] Date of Birth: Oct 13, 1940 Referring Provider (PT): Patrecia Pace PA-C   Encounter Date: 12/19/2019  PT End of Session - 12/19/19 1527    Visit Number  2    Number of Visits  12    Date for PT Re-Evaluation  02/01/20    Authorization Type  [employer]HC Medicare    Progress Note Due on Visit  10    PT Start Time  1523    PT Stop Time  1605    PT Time Calculation (min)  42 min    Equipment Utilized During Treatment  Gait belt    Activity Tolerance  Patient tolerated treatment well    Behavior During Therapy  [facility] for tasks assessed/performed       Past Medical History:  Diagnosis Date  . AAA (abdominal aortic aneurysm) (HCC)    4.2 cm by CTA 10/01/13- states "much smaller than first expected"Dr. Laurann [place] follows-checks every 2 yrs  . Allergic asthma   . Allergic rhinitis   . Arthritis    torn rotator cuff left shoulder  . Bruise    left chest area"recent fall"-no rib fracture identified.  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 Colitis   . GERD (gastroesophageal reflux disease)   . History of measles   . History of mumps   . HLD (hyperlipidemia)   . HTN (hypertension)   . Obesity   . Polio    left leg  . Pupil irregularity, right    old injury"pupil will not contract to light"  . Shortness of breath    exertion, asthma attack    Past Surgical History:  Procedure Laterality Date  . CARDIAC CATHETERIZATION     2006- 1 week prior tp CABPG  . CATARACT EXTRACTION    . COLONOSCOPY WITH PROPOFOL N/A 04/27/2016   Procedure: COLONOSCOPY WITH PROPOFOL;  Surgeon: Garlan Fair, MD;  Location: [facility];  Service: Endoscopy;  Laterality: N/A;  . CORONARY ARTERY BYPASS GRAFT  3/06   x4 vessel bypass [facility]-No problems since.  Marland Kitchen EYE SURGERY      rebuild eye socket (R)  . HIP PINNING,CANNULATED Left 10/01/2019  . HIP PINNING,CANNULATED Left 10/01/2019   Procedure: CANNULATED HIP PINNING;  Surgeon: Shona Needles, MD;  Location: [place];  Service: Orthopedics;  Laterality: Left;  . KNEE ARTHROSCOPY Left    left knee scope  . [place] SURGERY  2011  . LUMBAR LAMINECTOMY/DECOMPRESSION MICRODISCECTOMY Left 11/07/2013   Procedure: LUMBAR LAMINECTOMY/DECOMPRESSION MICRODISCECTOMY 1 LEVEL lumbar two/three;  Surgeon: Eustace Moore, MD;  Location: [place] [facility];  Service: Neurosurgery;  Laterality: Left;  . NASAL SINUS SURGERY      There were no vitals filed for this visit.  Subjective Assessment - 12/19/19 1525    Subjective  Patient says he is doing pretty good today, no new issues since yesterdays evaluation. Patient reports no pain currenlty.    Pertinent History  Hx of polio; known LLD L<R    Limitations  Lifting;Standing;Walking;House hold activities    How long can you stand comfortably?  30 min with walker    How long can you walk comfortably?  can walk all he wants with walker    Patient Stated Goals  Walk safely with cane    Currently  in Pain?  No/denies                       [facility] Adult PT Treatment/Exercise - 12/19/19 0001      Exercises   Exercises  Knee/Hip      Knee/Hip Exercises: Standing   Heel Raises  Both;2 sets;10 reps    Knee Flexion  Both;2 sets;10 reps    Hip Abduction  Both;2 sets;10 reps;Knee straight    Forward Step Up  Both;1 set;10 reps;Hand Hold: 2;Step Height: 4"    Gait Training  226' with RW, cues for posture    Other Standing Knee Exercises  staggered standing, solid floor, 2 x 30" each       Knee/Hip Exercises: Seated   Sit to Sand  2 sets;10 reps;with UE support             PT Education - 12/19/19 1634    Education Details  On exercise technique and HEP. Educated on and adjusted proper walker height for improved posturing during gait    Person(s) Educated  Patient     Methods  Explanation;Handout    Comprehension  Verbalized understanding       PT Short Term Goals - 12/18/19 1729      PT SHORT TERM GOAL #1   Title  Patient will be independent with initial HEP to improve functional outcomes    Time  3    Period  Weeks    Status  New    Target Date  01/11/20      PT SHORT TERM GOAL #2   Title  Patient will reports no falls since starting therapy to demonstrate improved balance and functional outcomes.    Time  3    Period  Weeks    Status  New    Target Date  01/11/20        PT Long Term Goals - 12/18/19 1730      PT LONG TERM GOAL #1   Title  Patient will be able to maintain tandem stance >30 seconds on BLEs to improve stability and reduce risk for falls    Time  6    Period  Weeks    Status  New    Target Date  02/01/20      PT LONG TERM GOAL #2   Title  Patient will improve FOTO score to < 30% to indicate improvement in functional outcomes    Time  6    Period  Weeks    Status  New    Target Date  02/01/20      PT LONG TERM GOAL #3   Title  Patient will have equal to or > 4+/5 MMT throughout BLE to improve ability to perform functional mobility, stair ambulation and ADLs.    Time  6    Period  Weeks    Status  New    Target Date  02/01/20      PT LONG TERM GOAL #4   Title  Patient will be able to ambulate at least 400 feet during with LRAD to demonstrate improved ability to perform functional mobility and associated tasks.    Time  6    Period  Weeks    Status  New    Target Date  02/01/20            Plan - 12/19/19 1635    Clinical Impression Statement  Initiated the ex this visit. Review patient goals  and issued HEP. Patient able to perform HEP exercise with verbal cues for correct mechanics without complaint. Patient able to progress standing hip strengthening and static balance activity today with minimal fatigue and no increased complaint of pain. Patient reports improved mechanics with gait after adjusting  RW for proper height. Patient educated on and issued HEP handout. Patient will continue to benefit form skilled therapy services to progress hip strengthening and balance.    Personal Factors and Comorbidities  Age    Examination-Activity Limitations  Lift;Stand;Locomotion Level;Stairs;Squat;Hygiene/Grooming;Dressing;Transfers    Examination-Participation Restrictions  Yard Work;Cleaning;Community Activity;Laundry    Stability/Clinical Decision Making  Stable/Uncomplicated    Rehab Potential  Good    PT Frequency  2x / week    PT Duration  6 weeks    PT Treatment/Interventions  ADLs/Self Care Home Management;Aquatic Therapy;Biofeedback;Cryotherapy;Electrical Stimulation;Contrast Bath;Therapeutic exercise;Orthotic Fit/Training;Compression bandaging;Manual lymph drainage;Splinting;Taping;Vasopneumatic Device;Joint Manipulations;Spinal Manipulations;Dry needling;Energy conservation;Manual techniques;Patient/family education;Therapeutic activities;Functional mobility training;Traction;Ultrasound;Parrafin;Fluidtherapy;Moist Heat;Stair training;Gait training;Iontophoresis 4mg /ml Dexamethasone;DME Instruction;Balance training;Scar mobilization;Vestibular;Passive range of motion;Wheelchair mobility training;Neuromuscular re-education    PT Next Visit Plan  Assess resposne to HEP. Progress standing hip strength, balance, and endurance as able with foucs on functional activity. Add lateral step up and sidestepping next visit.    PT Home Exercise Plan  12/19/19: sit/stands, heel raise, tandem stance at sink    Consulted and Agree with Plan of Care  Patient       Patient will benefit from skilled therapeutic intervention in order to improve the following deficits and impairments:  Pain, Improper body mechanics, Abnormal gait, Decreased mobility, Decreased activity tolerance, Decreased endurance, Decreased range of motion, Decreased strength, Hypomobility, Decreased balance, Difficulty walking, Impaired  flexibility  Visit Diagnosis: Pain in left hip  Other abnormalities of gait and mobility     Problem List Patient Active Problem List   Diagnosis Date Noted  . AAA (abdominal aortic aneurysm) without rupture (HCC) 10/18/2019  . Closed left hip fracture, initial encounter (HCC) 10/01/2019  . Hip fracture (HCC) 10/01/2019  . Nocturnal leg cramps 09/27/2014  . S/P lumbar microdiscectomy 11/07/2013  . Lumbar disc disease 10/29/2013  . CAD (coronary artery disease)   . Obesity (BMI 30-39.9)   . Pupil diameter unequal   . Hyperlipidemia   . Hypertensive heart disease   . 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 Esophageal reflux 01/01/2008  . Seasonal and perennial allergic rhinitis    4:41 PM, 12/19/19 12/21/19 PT DPT  Physical Therapist with [facility]  [facility]  [phone]   [facility] [address] [place], [place], [place] Phone: [phone]   Fax:  [phone]  Name: Thomas [name] MRN: [name] Date of Birth: 1941-01-12

## 2019-12-19 NOTE — Patient Instructions (Signed)
Access Code: [email or web address] URL: [email or web address] Date: 12/19/2019 Prepared by: Georges Lynch  Exercises Sit to Stand with Armchair - 2 x daily - 7 x weekly - 2 sets - 10 reps Heel rises with counter support - 2 x daily - 7 x weekly - 2 sets - 10 reps Standing Tandem Balance with Counter Support - 2 x daily - 7 x weekly - 1 sets - 3 reps - 20 sec hold

## 2019-12-26 ENCOUNTER — Telehealth (HOSPITAL_COMMUNITY): Payer: Self-pay | Admitting: Physical Therapy

## 2019-12-26 ENCOUNTER — Ambulatory Visit (HOSPITAL_COMMUNITY): Payer: Medicare Other | Admitting: Physical Therapy

## 2019-12-26 NOTE — Telephone Encounter (Signed)
Called patient about missed appointment today. Left voice mail, and reminder about upcoming appointment on Friday at 2:30P.   4:34 PM, 12/26/19 Georges Lynch PT DPT  Physical Therapist with [facility]  [phone]

## 2019-12-26 NOTE — Telephone Encounter (Signed)
pt lm that he was sorry he missed his appt for today and to please cancel all remaining appts, no reason given

## 2019-12-28 ENCOUNTER — Ambulatory Visit (HOSPITAL_COMMUNITY): Payer: Medicare Other | Admitting: Physical Therapy

## 2019-12-31 ENCOUNTER — Encounter (HOSPITAL_COMMUNITY): Payer: Self-pay | Admitting: Physical Therapy

## 2019-12-31 NOTE — Therapy (Signed)
[place], [place], [address] Phone: [phone]   Fax:  [phone]  Patient Details  Name: Thomas [name] MRN: [id number] Date of Birth: 05-Sep-1940 Referring Provider:  No ref. provider found  Encounter Date: 12/31/2019 PHYSICAL THERAPY DISCHARGE SUMMARY  Visits from Start of Care: 2  Current functional level related to goals / functional outcomes: Patient attended a total of 2 therapy session, see initial evaluation for most recent functional level   Remaining deficits: See initial evaluation   Education / Equipment: Patient called to request self DC from therapy at this time, no reason given. Plan: Patient agrees to discharge.  Patient goals were not met. Patient is being discharged due to the patient's request.  ?????        10:23 AM, 12/31/19 Josue Hector PT DPT  Physical Therapist with [facility]  [phone] [place], [place], [address] Phone: [phone]   Fax:  [phone]

## 2020-01-01 ENCOUNTER — Ambulatory Visit (HOSPITAL_COMMUNITY): Payer: Medicare Other | Admitting: Physical Therapy

## 2020-01-03 ENCOUNTER — Encounter (HOSPITAL_COMMUNITY): Payer: Medicare Other | Admitting: Physical Therapy

## 2020-01-07 NOTE — Progress Notes (Signed)
Cardiology Office Note:   Date:  01/08/2020  NAME:  Thomas [name]    MRN: [id number] DOB:  February 12, 1941   PCP:  Kirby Funk, MD  Cardiologist:  Reatha Harps, MD   Referring MD: Kirby Funk, MD   Chief Complaint  Patient presents with  . Coronary Artery Disease   History of Present Illness:   Thomas [name] is a 79 y.o. male with a hx of AAA, CAD s/p CABG, HTN, HLD who is being seen today for the evaluation of CAD at the request of Kirby Funk, MD.  He reports he is doing well.  He is getting over a hip fracture.  He reports he is working with physical therapy.  He reports he can walk as far as he likes without any chest pain or shortness of breath.  He does have some lower extremity edema but this improves during the day.  He reports it improves with leg elevation as well.  He reports he is been intolerant of statins.  Most recent LDL cholesterol 84.  He is interested in Zetia.  Blood pressures well controlled today.  He is a former tobacco user but quit a number of years ago.  He does have a AAA and this is followed by vascular surgery.  Apparently the measurements have been stable as detailed below.  Overall, he appears to be doing well and is without any major complaints today.  Last a primary care physician office in 2019 demonstrate total cholesterol 154, HDL 31, LDL 84, triglycerides 196  Problem List 1. AAA -4.8 cm 10/18/2019 2. CAD -CABG x 4 2006 -LIMA-LAD, SVG-RI, SVG-OM, SVG-PDA 3. HTN 4. HLD 5. Prior tobacco abuse   Past Medical History: Past Medical History:  Diagnosis Date  . AAA (abdominal aortic aneurysm) (HCC)    4.2 cm by CTA 10/01/13- states "much smaller than first expected"Dr. Valentina Lucks follows-checks every 2 yrs  . Allergic asthma   . Allergic rhinitis   . Arthritis    torn rotator cuff left shoulder  . Bruise    left chest area"recent fall"-no rib fracture identified.  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 Colitis   . GERD  (gastroesophageal reflux disease)   . History of measles   . History of mumps   . HLD (hyperlipidemia)   . HTN (hypertension)   . Obesity   . Polio    left leg  . Pupil irregularity, right    old injury"pupil will not contract to light"  . Shortness of breath    exertion, asthma attack    Past Surgical History: Past Surgical History:  Procedure Laterality Date  . CARDIAC CATHETERIZATION     2006- 1 week prior tp CABPG  . CATARACT EXTRACTION    . COLONOSCOPY WITH PROPOFOL N/A 04/27/2016   Procedure: COLONOSCOPY WITH PROPOFOL;  Surgeon: Charolett Bumpers, MD;  Location: [facility];  Service: Endoscopy;  Laterality: N/A;  . CORONARY ARTERY BYPASS GRAFT  3/06   x4 vessel bypass [facility]-No problems since.  Marland Kitchen EYE SURGERY     rebuild eye socket (R)  . HIP PINNING,CANNULATED Left 10/01/2019  . HIP PINNING,CANNULATED Left 10/01/2019   Procedure: CANNULATED HIP PINNING;  Surgeon: Roby Lofts, MD;  Location: [facility];  Service: Orthopedics;  Laterality: Left;  . KNEE ARTHROSCOPY Left    left knee scope  . LUMBAR DISC SURGERY  2011  . LUMBAR LAMINECTOMY/DECOMPRESSION MICRODISCECTOMY Left 11/07/2013   Procedure: LUMBAR LAMINECTOMY/DECOMPRESSION MICRODISCECTOMY  1 LEVEL lumbar two/three;  Surgeon: Tia Alert, MD;  Location: [facility];  Service: Neurosurgery;  Laterality: Left;  . NASAL SINUS SURGERY      Current Medications: Current Meds  Medication Sig  . aspirin EC 81 MG tablet Take 81 mg by mouth daily.  . Cholecalciferol (VITAMIN D-3) 125 MCG (5000 UT) TABS Take 5,000 Units by mouth daily.  . DULERA 200-5 MCG/ACT AERO Inhale 2 puffs into the lungs 2 (two) times daily.  Marland Kitchen escitalopram (LEXAPRO) 10 MG tablet Take 10 mg by mouth daily.  . fexofenadine (ALLEGRA) 180 MG tablet Take 180 mg by mouth daily as needed for allergies.   Marland Kitchen lisinopril (PRINIVIL,ZESTRIL) 10 MG tablet Take 10 mg by mouth daily.   Marland Kitchen LORazepam (ATIVAN) 1 MG tablet Take 1 mg by mouth at bedtime as needed.  Marland Kitchen PROAIR  HFA 108 (90 Base) MCG/ACT inhaler Inhale 2 puffs into the lungs every 4 (four) hours as needed for wheezing.      Allergies:    Aspirin, Codeine, Fluticasone-salmeterol, Montelukast sodium, Adhesive [tape], Cephalexin, Ibuprofen, Latex, Statins, and Sulfonamide derivatives   Social History: Social History   Socioeconomic History  . Marital status: Married    Spouse name: Not on file  . Number of children: Not on file  . Years of education: Not on file  . Highest education level: Not on file  Occupational History  . Occupation: Press photographer: Oceanographer    Comment: Chief Financial Officer  Tobacco Use  . Smoking status: Former Smoker    Packs/day: 1.00    Years: 37.00    Pack years: 37.00    Types: Cigarettes    Quit date: 08/23/1992    Years since quitting: 27.3  . Smokeless tobacco: Never Used  Substance and Sexual Activity  . Alcohol use: Not Currently    Comment: socially   . Drug use: No  . Sexual activity: Not on file  Other Topics Concern  . Not on file  Social History Narrative  . Not on file   Social Determinants of Health   Financial Resource Strain:   . Difficulty of Paying Living Expenses:   Food Insecurity:   . Worried About Programme researcher, broadcasting/film/video in the Last Year:   . Barista in the Last Year:   Transportation Needs:   . Freight forwarder (Medical):   Marland Kitchen Lack of Transportation (Non-Medical):   Physical Activity:   . Days of Exercise per Week:   . Minutes of Exercise per Session:   Stress:   . Feeling of Stress :   Social Connections:   . Frequency of Communication with Friends and Family:   . Frequency of Social Gatherings with Friends and Family:   . Attends Religious Services:   . Active Member of Clubs or Organizations:   . Attends Banker Meetings:   Marland Kitchen Marital Status:     Family History: The patient's family history includes Alzheimer's disease in his mother; Liver disease in his sister; Lung cancer in his brother.   ROS:   All other ROS reviewed and negative. Pertinent positives noted in the HPI.     EKGs/Labs/Other Studies Reviewed:   The following studies were personally reviewed by me today:  EKG:  EKG is ordered today.  The ekg ordered today demonstrates normal sinus rhythm, heart rate 70, no acute ST-T changes, no evidence of prior infarction, and was personally reviewed by me.   AAA  Duplex  Abdominal Aorta: There is evidence of abnormal dilatation of the distal  Abdominal aorta. Previous diameter measurement was 4.7 x 4.72 cm obtained  on 04/10/19.   Recent Labs: 10/01/2019: ALT 21; TSH 1.974 10/03/2019: BUN 26; Creatinine, Ser 0.99; Hemoglobin 11.8; Platelets 174; Potassium 3.9; Sodium 137   Recent Lipid Panel    Component Value Date/Time   CHOL 170 03/05/2011 0835   TRIG 89.0 03/05/2011 0835   HDL 48.20 03/05/2011 0835   CHOLHDL 4 03/05/2011 0835   VLDL 17.8 03/05/2011 0835   LDLCALC 104 (H) 03/05/2011 0835    Physical Exam:   VS:  BP (!) 126/58   Pulse 70   Ht 5\' 8"  (1.727 m)   Wt 183 lb 6.4 oz (83.2 kg)   SpO2 98%   BMI 27.89 kg/m    Wt Readings from Last 3 Encounters:  01/08/20 183 lb 6.4 oz (83.2 kg)  10/18/19 188 lb (85.3 kg)  10/07/19 183 lb 15.9 oz (83.5 kg)    General: Well nourished, well developed, in no acute distress Heart: Atraumatic, normal size  Eyes: PEERLA, EOMI  Neck: Supple, no JVD Endocrine: No thryomegaly Cardiac: Normal S1, S2; RRR; no murmurs, rubs, or gallops Lungs: Clear to auscultation bilaterally, no wheezing, rhonchi or rales  Abd: Soft, nontender, no hepatomegaly  Ext: No edema, pulses 2+ Musculoskeletal: No deformities, BUE and BLE strength normal and equal Skin: Warm and dry, no rashes   Neuro: Alert and oriented to person, place, time, and situation, CNII-XII grossly intact, no focal deficits  Psych: Normal mood and affect   ASSESSMENT:   Thomas [name] is a 79 y.o. male who presents for the following: 1. Coronary artery disease  involving coronary bypass graft of native heart without angina pectoris   2. Essential hypertension   3. Mixed hyperlipidemia   4. AAA (abdominal aortic aneurysm) without rupture (HCC)     PLAN:   1. Coronary artery disease involving coronary bypass graft of native heart without angina pectoris -CABG x 4 2006 -LIMA-LAD, SVG-RI, SVG-OM, SVG-PDA -doing well no complaints of chest pain.  EKG normal sinus rhythm without acute ischemic changes.  He has no symptoms of angina.  We will continue with aspirin.  He is statin intolerant.  We will start Zetia 10 mg daily.  Most recent LDL cholesterol 84.  Goal is less than 70.  He will have a physical done by his primary care physician in 2 months and he will send 2007 results of this.  This will include a fasting lipid profile.  2. Essential hypertension -Blood pressure well controlled.  Continue lisinopril.  3. Mixed hyperlipidemia -He reports intolerance of all statins.  We will try Zetia 10 mg daily.  His most recent LDL cholesterol 84.  He will have a recheck by primary care physician in 2 months.  This will be okay.  We will follow-up the results  4. AAA (abdominal aortic aneurysm) without rupture (HCC) -Most recent ultrasound shows AAA 4.7 cm.  No change from prior previously. This is followed by vascular surgery.  Disposition: Return in about 6 months (around 07/10/2020).  Medication Adjustments/Labs and Tests Ordered: Current medicines are reviewed at length with the patient today.  Concerns regarding medicines are outlined above.  Orders Placed This Encounter  Procedures  . EKG 12-Lead   Meds ordered this encounter  Medications  . ezetimibe (ZETIA) 10 MG tablet    Sig: Take 1 tablet (10 mg total) by mouth daily.  Dispense:  90 tablet    Refill:  3    Patient Instructions  Medication Instructions:  Start ZETIA 10 mg daily   *If you need a refill on your cardiac medications before your next appointment, please call your pharmacy*     Follow-Up: At [facility], you and your health needs are our priority.  As part of our continuing mission to provide you with exceptional heart care, we have created designated Provider Care Teams.  These Care Teams include your primary Cardiologist (physician) and Advanced Practice Providers (APPs -  Physician Assistants and Nurse Practitioners) who all work together to provide you with the care you need, when you need it.  We recommend signing up for the patient portal called "MyChart".  Sign up information is provided on this After Visit Summary.  MyChart is used to connect with patients for Virtual Visits (Telemedicine).  Patients are able to view lab/test results, encounter notes, upcoming appointments, etc.  Non-urgent messages can be sent to your provider as well.   To learn more about what you can do with MyChart, go to [email or web address].    Your next appointment:   6 month(s)  The format for your next appointment:   In Person  Provider:   Eleonore Chiquito, MD      Signed, Addison Naegeli. Audie Box, [place]  [address], [place], [place] [address] [phone]  01/08/2020 3:32 PM

## 2020-01-08 ENCOUNTER — Encounter: Payer: Self-pay | Admitting: Cardiovascular Disease

## 2020-01-08 ENCOUNTER — Ambulatory Visit (INDEPENDENT_AMBULATORY_CARE_PROVIDER_SITE_OTHER): Payer: Medicare Other | Admitting: Cardiovascular Disease

## 2020-01-08 ENCOUNTER — Encounter (HOSPITAL_COMMUNITY): Payer: Medicare Other | Admitting: Physical Therapy

## 2020-01-08 ENCOUNTER — Other Ambulatory Visit: Payer: Self-pay

## 2020-01-08 VITALS — BP 126/58 | HR 70 | Ht 68.0 in | Wt 183.4 lb

## 2020-01-08 DIAGNOSIS — I714 Abdominal aortic aneurysm, without rupture, unspecified: Secondary | ICD-10-CM

## 2020-01-08 DIAGNOSIS — I10 Essential (primary) hypertension: Secondary | ICD-10-CM

## 2020-01-08 DIAGNOSIS — E782 Mixed hyperlipidemia: Secondary | ICD-10-CM | POA: Diagnosis not present

## 2020-01-08 DIAGNOSIS — I25810 Atherosclerosis of coronary artery bypass graft(s) without angina pectoris: Secondary | ICD-10-CM

## 2020-01-08 MED ORDER — EZETIMIBE 10 MG PO TABS
10.0000 mg | ORAL_TABLET | Freq: Every day | ORAL | 3 refills | Status: DC
Start: 2020-01-08 — End: 2020-07-07

## 2020-01-08 NOTE — Patient Instructions (Signed)
Medication Instructions:  Start ZETIA 10 mg daily   *If you need a refill on your cardiac medications before your next appointment, please call your pharmacy*    Follow-Up: At [facility], you and your health needs are our priority.  As part of our continuing mission to provide you with exceptional heart care, we have created designated Provider Care Teams.  These Care Teams include your primary Cardiologist (physician) and Advanced Practice Providers (APPs -  Physician Assistants and Nurse Practitioners) who all work together to provide you with the care you need, when you need it.  We recommend signing up for the patient portal called "MyChart".  Sign up information is provided on this After Visit Summary.  MyChart is used to connect with patients for Virtual Visits (Telemedicine).  Patients are able to view lab/test results, encounter notes, upcoming appointments, etc.  Non-urgent messages can be sent to your provider as well.   To learn more about what you can do with MyChart, go to [email or web address].    Your next appointment:   6 month(s)  The format for your next appointment:   In Person  Provider:   Lennie Odor, MD

## 2020-01-10 ENCOUNTER — Encounter (HOSPITAL_COMMUNITY): Payer: Medicare Other | Admitting: Physical Therapy

## 2020-01-12 ENCOUNTER — Other Ambulatory Visit: Payer: Self-pay

## 2020-01-12 ENCOUNTER — Encounter: Payer: Self-pay | Admitting: Emergency Medicine

## 2020-01-12 ENCOUNTER — Ambulatory Visit
Admission: EM | Admit: 2020-01-12 | Discharge: 2020-01-12 | Disposition: A | Discharge status: Home / Self Care | Payer: Medicare Other | Attending: Emergency Medicine | Admitting: Emergency Medicine

## 2020-01-12 DIAGNOSIS — S0100XA Unspecified open wound of scalp, initial encounter: Secondary | ICD-10-CM | POA: Diagnosis not present

## 2020-01-12 DIAGNOSIS — S51011A Laceration without foreign body of right elbow, initial encounter: Secondary | ICD-10-CM

## 2020-01-12 DIAGNOSIS — W19XXXA Unspecified fall, initial encounter: Secondary | ICD-10-CM | POA: Diagnosis not present

## 2020-01-12 MED ORDER — MUPIROCIN CALCIUM 2 % EX CREA
1.0000 | TOPICAL_CREAM | Freq: Two times a day (BID) | CUTANEOUS | 0 refills | Status: DC
Start: 2020-01-12 — End: 2020-05-12

## 2020-01-12 NOTE — ED Provider Notes (Signed)
[facility]    CSN: [id number] Arrival date & time: 01/12/20  1452      History   Chief Complaint Chief Complaint  Patient presents with  . Fall    HPI Thomas [name] is a 79 y.o. male.    Presente  to the urgent care with a  complaint of fall that occurred today.  Reported hitting his head and right elbow to his house.  Abrasion noted on  head and right elbow.  States he lost consciousness.  Has tried OTC medication with no relief.  Nothing make his symptoms worse.  Denies similar symptoms in the past.  Denies chills, fever, nausea, vomiting, diarrhea, confusion, LOC, blurry vision, diplopia, paresthesia.  The history is provided by the patient. No language interpreter was used.  Fall    Past Medical History:  Diagnosis Date  . AAA (abdominal aortic aneurysm) (HCC)    4.2 cm by CTA 10/01/13- states "much smaller than first expected"Dr. Laurann Montana follows-checks every 2 yrs  . Allergic asthma   . Allergic rhinitis   . Arthritis    torn rotator cuff left shoulder  . Bruise    left chest area"recent fall"-no rib fracture identified.  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 Colitis   . GERD (gastroesophageal reflux disease)   . History of measles   . History of mumps   . HLD (hyperlipidemia)   . HTN (hypertension)   . Obesity   . Polio    left leg  . Pupil irregularity, right    old injury"pupil will not contract to light"  . Shortness of breath    exertion, asthma attack    Patient Active Problem List   Diagnosis Date Noted  . AAA (abdominal aortic aneurysm) without rupture ([place]) 10/18/2019  . Closed left hip fracture, initial encounter ([place]) 10/01/2019  . Hip fracture ([place]) 10/01/2019  . Nocturnal leg cramps 09/27/2014  . S/P lumbar microdiscectomy 11/07/2013  . Lumbar disc disease 10/29/2013  . CAD (coronary artery disease)   . Obesity (BMI 30-39.9)   . Pupil diameter unequal   . Hyperlipidemia   . Hypertensive heart disease   .  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 Esophageal reflux 01/01/2008  . Seasonal and perennial allergic rhinitis     Past Surgical History:  Procedure Laterality Date  . CARDIAC CATHETERIZATION     2006- 1 week prior tp CABPG  . CATARACT EXTRACTION    . COLONOSCOPY WITH PROPOFOL N/A 04/27/2016   Procedure: COLONOSCOPY WITH PROPOFOL;  Surgeon: Garlan Fair, MD;  Location: [facility];  Service: Endoscopy;  Laterality: N/A;  . CORONARY ARTERY BYPASS GRAFT  3/06   x4 vessel bypass [facility]-No problems since.  Marland Kitchen EYE SURGERY     rebuild eye socket (R)  . HIP PINNING,CANNULATED Left 10/01/2019  . HIP PINNING,CANNULATED Left 10/01/2019   Procedure: CANNULATED HIP PINNING;  Surgeon: Shona Needles, MD;  Location: [facility];  Service: Orthopedics;  Laterality: Left;  . KNEE ARTHROSCOPY Left    left knee scope  . [place] SURGERY  2011  . LUMBAR LAMINECTOMY/DECOMPRESSION MICRODISCECTOMY Left 11/07/2013   Procedure: LUMBAR LAMINECTOMY/DECOMPRESSION MICRODISCECTOMY 1 LEVEL lumbar two/three;  Surgeon: Eustace Moore, MD;  Location: [facility];  Service: Neurosurgery;  Laterality: Left;  . NASAL SINUS SURGERY         Home Medications    Prior to Admission medications   Medication Sig Start Date End Date Taking?  Authorizing Provider  aspirin EC 81 MG tablet Take 81 mg by mouth daily.    [provider]  Cholecalciferol (VITAMIN D-3) 125 MCG (5000 UT) TABS Take 5,000 Units by mouth daily. 10/03/19   Despina Hidden, PA-C  DULERA 200-5 MCG/ACT AERO Inhale 2 puffs into the lungs 2 (two) times daily. 05/29/19   [provider]  escitalopram (LEXAPRO) 10 MG tablet Take 10 mg by mouth daily. 11/19/19   [provider]  ezetimibe (ZETIA) 10 MG tablet Take 1 tablet (10 mg total) by mouth daily. 01/08/20 04/07/20  O'NealRonnald Ramp, MD  fexofenadine (ALLEGRA) 180 MG tablet Take 180 mg by mouth daily as needed for allergies.     [provider]  lisinopril  (PRINIVIL,ZESTRIL) 10 MG tablet Take 10 mg by mouth daily.     [provider]  LORazepam (ATIVAN) 1 MG tablet Take 1 mg by mouth at bedtime as needed. 11/19/19   [provider]  mupirocin cream (BACTROBAN) 2 % Apply 1 application topically 2 (two) times daily. 01/12/20   Jakeria Caissie, Zachery Dakins, FNP  PROAIR HFA 108 (90 Base) MCG/ACT inhaler Inhale 2 puffs into the lungs every 4 (four) hours as needed for wheezing.  09/19/19   [provider]    Family History Family History  Problem Relation Age of Onset  . Alzheimer's disease Mother   . Liver disease Sister   . Lung cancer Brother     Social History Social History   Tobacco Use  . Smoking status: Former Smoker    Packs/day: 1.00    Years: 37.00    Pack years: 37.00    Types: Cigarettes    Quit date: 08/23/1992    Years since quitting: 27.4  . Smokeless tobacco: Never Used  Substance Use Topics  . Alcohol use: Not Currently    Comment: socially   . Drug use: No     Allergies   Aspirin, Codeine, Fluticasone-salmeterol, Montelukast sodium, Adhesive [tape], Cephalexin, Ibuprofen, Latex, Statins, and Sulfonamide derivatives   Review of Systems Review of Systems  Constitutional: Negative.   HENT: Negative.   Respiratory: Negative.   Cardiovascular: Negative.   Gastrointestinal: Negative.   Skin: Positive for color change and wound.  Neurological: Negative.   All other systems reviewed and are negative.    Physical Exam Triage Vital Signs ED Triage Vitals [01/12/20 1511]  Enc Vitals Group     BP 139/73     Pulse Rate 75     Resp 18     Temp 98.6 F (37 C)     Temp Source Oral     SpO2 96 %     Weight      Height      Head Circumference      Peak Flow      Pain Score 4     Pain Loc      Pain Edu?      Excl. in GC?    No data found.  Updated Vital Signs BP 139/73 (BP Location: Left Arm)   Pulse 75   Temp 98.6 F (37 C) (Oral)   Resp 18   SpO2 96%   Visual Acuity Right Eye  Distance:   Left Eye Distance:   Bilateral Distance:    Right Eye Near:   Left Eye Near:    Bilateral Near:     Physical Exam Vitals and nursing note reviewed.  Constitutional:      General: He is not in  acute distress.    Appearance: Normal appearance. He is normal weight. He is not ill-appearing or toxic-appearing.  HENT:     Head: Normocephalic.     Right Ear: Tympanic membrane, ear canal and external ear normal. There is no impacted cerumen.     Left Ear: Tympanic membrane, ear canal and external ear normal. There is no impacted cerumen.     Nose: Nose normal. No congestion.     Mouth/Throat:     Mouth: Mucous membranes are moist.     Pharynx: Oropharynx is clear. No oropharyngeal exudate or posterior oropharyngeal erythema.  Cardiovascular:     Rate and Rhythm: Normal rate and regular rhythm.     Pulses: Normal pulses.     Heart sounds: Normal heart sounds. No murmur.  Pulmonary:     Effort: Pulmonary effort is normal. No respiratory distress.     Breath sounds: Normal breath sounds. No wheezing or rhonchi.  Chest:     Chest wall: No tenderness.  Skin:    General: Skin is warm.     Findings: Wound present. No bruising, lesion or rash.     Comments: Wound present to head and right elbow  Neurological:     Mental Status: He is alert and oriented to person, place, and time.      UC Treatments / Results  Labs (all labs ordered are listed, but only abnormal results are displayed) Labs Reviewed - No data to display  EKG   Radiology No results found.  Procedures Procedures (including critical care time)  Medications Ordered in UC Medications - No data to display  Initial Impression / Assessment and Plan / UC Course  I have reviewed the triage vital signs and the nursing notes.  Pertinent labs & imaging results that were available during my care of the patient were reviewed by me and considered in my medical decision making (see chart for details).      Patient is stable at discharge.  Neuro assessment is otherwise normal.  Wound care was completed.  Bactroban was prescribed Final Clinical Impressions(s) / UC Diagnoses   Final diagnoses:  Fall, initial encounter  Open wound of scalp, unspecified open wound type, initial encounter  Laceration of skin of right elbow, initial encounter     Discharge Instructions     Wash area daily with warm water and soap Apply a thin layer of Bactroban May take OTC Tylenol/ibuprofen as needed for pain Follow-up with PCP Return or go to ED for worsening of symptoms    ED Prescriptions    Medication Sig Dispense Auth. Provider   mupirocin cream (BACTROBAN) 2 % Apply 1 application topically 2 (two) times daily. 15 g Durward Parcel, FNP     PDMP not reviewed this encounter.   Durward Parcel, FNP 01/12/20 1559

## 2020-01-12 NOTE — Discharge Instructions (Addendum)
Wash area daily with warm water and soap Apply a thin layer of Bactroban May take OTC Tylenol/ibuprofen as needed for pain Follow-up with PCP Return or go to ED for worsening of symptoms

## 2020-01-12 NOTE — ED Triage Notes (Signed)
Pt here after trip and fall today; pt sts hit head and right elbow on side of house; abrasion noted to both with bleeding controlled; denies LOC or blood thinners; denies wanting head CT

## 2020-01-15 ENCOUNTER — Encounter (HOSPITAL_COMMUNITY): Payer: Medicare Other | Admitting: Physical Therapy

## 2020-01-17 ENCOUNTER — Encounter (HOSPITAL_COMMUNITY): Payer: Medicare Other | Admitting: Physical Therapy

## 2020-01-22 ENCOUNTER — Encounter (HOSPITAL_COMMUNITY): Payer: Medicare Other | Admitting: Physical Therapy

## 2020-01-24 ENCOUNTER — Encounter (HOSPITAL_COMMUNITY): Payer: Medicare Other | Admitting: Physical Therapy

## 2020-05-06 ENCOUNTER — Ambulatory Visit (HOSPITAL_COMMUNITY)
Admission: RE | Admit: 2020-05-06 | Discharge: 2020-05-06 | Disposition: A | Discharge status: Home / Self Care | Payer: Medicare Other | Source: Ambulatory Visit | Attending: Vascular Surgery | Admitting: Vascular Surgery

## 2020-05-06 ENCOUNTER — Ambulatory Visit: Payer: Medicare Other

## 2020-05-06 ENCOUNTER — Other Ambulatory Visit: Payer: Self-pay

## 2020-05-06 DIAGNOSIS — I714 Abdominal aortic aneurysm, without rupture, unspecified: Secondary | ICD-10-CM

## 2020-05-12 ENCOUNTER — Encounter: Payer: Self-pay | Admitting: Vascular Surgery

## 2020-05-12 ENCOUNTER — Ambulatory Visit: Payer: Medicare Other | Admitting: Vascular Surgery

## 2020-05-12 ENCOUNTER — Other Ambulatory Visit: Payer: Self-pay

## 2020-05-12 VITALS — BP 147/67 | HR 70 | Temp 97.9°F | Resp 18 | Ht 68.0 in | Wt 182.0 lb

## 2020-05-12 DIAGNOSIS — I714 Abdominal aortic aneurysm, without rupture, unspecified: Secondary | ICD-10-CM

## 2020-05-12 NOTE — Progress Notes (Signed)
[facility] [place]  Patient name: Thomas [name] MRN: [id number] DOB: 1941/05/23 Sex: male  REASON FOR VISIT: Continued follow-up abdominal aortic aneurysm  HPI: Thomas [name] is a 79 y.o. male here today for continued follow-up.  He has a known history of asymptomatic infrarenal abdominal aortic aneurysm and is here for discussion of recent ultrasound.  He is seen today in our [place] office.  He turned 79 and April.  Since his last visit with [place] he fell and had a hip fracture.  Initially was walking with a walker but now walks with a 4 pronged cane.  Otherwise is doing well.  Past Medical History:  Diagnosis Date  . AAA (abdominal aortic aneurysm) (HCC)    4.2 cm by CTA 10/01/13- states "much smaller than first expected"Dr. Valentina Lucks follows-checks every 2 yrs  . Allergic asthma   . Allergic rhinitis   . Arthritis    torn rotator cuff left shoulder  . Bruise    left chest area"recent fall"-no rib fracture identified.  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 Colitis   . GERD (gastroesophageal reflux disease)   . History of measles   . History of mumps   . HLD (hyperlipidemia)   . HTN (hypertension)   . Obesity   . Polio    left leg  . Pupil irregularity, right    old injury"pupil will not contract to light"  . Shortness of breath    exertion, asthma attack    Family History  Problem Relation Age of Onset  . Alzheimer's disease Mother   . Liver disease Sister   . Lung cancer Brother     SOCIAL HISTORY: Social History   Tobacco Use  . Smoking status: Former Smoker    Packs/day: 1.00    Years: 37.00    Pack years: 37.00    Types: Cigarettes    Quit date: 08/23/1992    Years since quitting: 27.7  . Smokeless tobacco: Never Used  Substance Use Topics  . Alcohol use: Not Currently    Comment: socially     Allergies  Allergen Reactions  . Aspirin Hives  . Codeine Hives  .  Fluticasone-Salmeterol Hives  . Montelukast Sodium Hives  . Adhesive [Tape] Rash  . Cephalexin Rash  . Ibuprofen Rash  . Latex Rash  . Statins Rash  . Sulfonamide Derivatives Rash    Current Outpatient Medications  Medication Sig Dispense Refill  . aspirin EC 81 MG tablet Take 81 mg by mouth daily.    . Cholecalciferol (VITAMIN D-3) 125 MCG (5000 UT) TABS Take 5,000 Units by mouth daily. 90 tablet 1  . DULERA 200-5 MCG/ACT AERO Inhale 2 puffs into the lungs 2 (two) times daily.    Marland Kitchen escitalopram (LEXAPRO) 10 MG tablet Take 10 mg by mouth daily.    . fexofenadine (ALLEGRA) 180 MG tablet Take 180 mg by mouth daily as needed for allergies.     Marland Kitchen lisinopril (PRINIVIL,ZESTRIL) 10 MG tablet Take 10 mg by mouth daily.     Marland Kitchen LORazepam (ATIVAN) 1 MG tablet Take 1 mg by mouth at bedtime as needed.    Marland Kitchen PROAIR HFA 108 (90 Base) MCG/ACT inhaler Inhale 2 puffs into the lungs every 4 (four) hours as needed for wheezing.     . ezetimibe (ZETIA) 10 MG tablet Take 1 tablet (10 mg total) by mouth daily. 90 tablet 3   No current facility-administered medications for  this visit.    REVIEW OF SYSTEMS:  [X]  denotes positive finding, [ ]  denotes negative finding Cardiac  Comments:  Chest pain or chest pressure:    Shortness of breath upon exertion:    Short of breath when lying flat:    Irregular heart rhythm:        Vascular    Pain in calf, thigh, or hip brought on by ambulation:    Pain in feet at night that wakes you up from your sleep:     Blood clot in your veins:    Leg swelling:           PHYSICAL EXAM: Vitals:   05/12/20 1104  BP: (!) 147/67  Pulse: 70  Resp: 18  Temp: 97.9 F (36.6 C)  TempSrc: Oral  SpO2: 96%  Weight: 182 lb (82.6 kg)  Height: 5\' 8"  (1.727 m)    GENERAL: The patient is a well-nourished male, in no acute distress. The vital signs are documented above. CARDIOVASCULAR: 2+ radial and 2+ femoral pulses bilaterally.  Abdomen is soft.  I do not palpate an  aneurysm PULMONARY: There is good air exchange  MUSCULOSKELETAL: There are no major deformities or cyanosis. NEUROLOGIC: No focal weakness or paresthesias are detected. SKIN: There are no ulcers or rashes noted. PSYCHIATRIC: The patient has a normal affect.  DATA:  Duplex on 05/06/2020 was reviewed.  This shows maximal diameter of 4.8 cm.  MEDICAL ISSUES: Stable overall.  I have reviewed his studies dating back prior to 2019.  He has had no change in the diameter of his aorta since this period of time.  In 2015 it was 4.2 cm in diameter.  I explained the very slight risk for rupture with the moderate size aneurysm.  I have recommended that we see him in 1 year rather than 6 months due to the stable nature of his aneurysm.  We will see him then with repeat ultrasound    05/08/2020, MD [facility] Tel [phone] Pager [phone]

## 2020-06-05 ENCOUNTER — Ambulatory Visit: Payer: Medicare Other

## 2020-06-12 ENCOUNTER — Ambulatory Visit: Payer: Medicare Other | Attending: Internal Medicine

## 2020-06-12 DIAGNOSIS — Z23 Encounter for immunization: Secondary | ICD-10-CM

## 2020-06-12 NOTE — Progress Notes (Signed)
   Covid-19 Vaccination Clinic  Name:  Thomas [name]    MRN: [id number] DOB: Dec 26, 1940  06/12/2020  Mr. Thomas [name] was observed post Covid-19 immunization for 15 minutes without incident. He was provided with Vaccine Information Sheet and instruction to access the V-Safe system.   Mr. Thomas [name] was instructed to call 911 with any severe reactions post vaccine: Marland Kitchen Difficulty breathing  . Swelling of face and throat  . A fast heartbeat  . A bad rash all over body  . Dizziness and weakness

## 2020-06-16 ENCOUNTER — Other Ambulatory Visit: Payer: Self-pay

## 2020-06-16 ENCOUNTER — Encounter: Payer: Self-pay | Admitting: Urology

## 2020-06-16 ENCOUNTER — Ambulatory Visit (INDEPENDENT_AMBULATORY_CARE_PROVIDER_SITE_OTHER): Payer: Medicare Other | Admitting: Urology

## 2020-06-16 VITALS — BP 186/75 | HR 71 | Temp 98.2°F | Ht 68.0 in | Wt 182.0 lb

## 2020-06-16 DIAGNOSIS — N401 Enlarged prostate with lower urinary tract symptoms: Secondary | ICD-10-CM | POA: Diagnosis not present

## 2020-06-16 DIAGNOSIS — R3911 Hesitancy of micturition: Secondary | ICD-10-CM

## 2020-06-16 DIAGNOSIS — N138 Other obstructive and reflux uropathy: Secondary | ICD-10-CM | POA: Diagnosis not present

## 2020-06-16 DIAGNOSIS — N5201 Erectile dysfunction due to arterial insufficiency: Secondary | ICD-10-CM | POA: Diagnosis not present

## 2020-06-16 LAB — URINALYSIS, ROUTINE W REFLEX MICROSCOPIC
Bilirubin, UA: NEGATIVE
Glucose, UA: NEGATIVE
Ketones, UA: NEGATIVE
Leukocytes,UA: NEGATIVE
Nitrite, UA: NEGATIVE
Protein,UA: NEGATIVE
Specific Gravity, UA: 1.015 (ref 1.005–1.030)
Urobilinogen, Ur: 0.2 mg/dL (ref 0.2–1.0)
pH, UA: 6 (ref 5.0–7.5)

## 2020-06-16 LAB — MICROSCOPIC EXAMINATION
Bacteria, UA: NONE SEEN
Epithelial Cells (non renal): NONE SEEN /hpf (ref 0–10)
Renal Epithel, UA: NONE SEEN /hpf
WBC, UA: NONE SEEN /hpf (ref 0–5)

## 2020-06-16 NOTE — Progress Notes (Signed)
06/16/2020 2:09 PM   Thomas [name] [birth year] [id number]  Referring provider: Kirby Funk, MD [address] [place],  [place] [address]  Erectile dysfunction  HPI: Thomas [name] is a 79yo here for followup for erectile dysfunction. He has previously tried cialis and viagra without good results. He is interested in further therapy. We discussed IPP at his last visit and the patients defers at this time. He has mild LUTS on no BPH therapy. He denies nocturia. Stream fair. He has urinary hesitancy,  Starting/stopping, urinary frequency.  He is not bothered by his LUTS   PMH: Past Medical History:  Diagnosis Date  . AAA (abdominal aortic aneurysm) (HCC)    4.2 cm by CTA 10/01/13- states "much smaller than first expected"Dr. Valentina Lucks follows-checks every 2 yrs  . Allergic asthma   . Allergic rhinitis   . Anxiety depression  . Arthritis    torn rotator cuff left shoulder  . Bruise    left chest area"recent fall"-no rib fracture identified.  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 Colitis   . GERD (gastroesophageal reflux disease)   . History of measles   . History of mumps   . HLD (hyperlipidemia)   . HTN (hypertension)   . Obesity   . Polio    left leg  . Pupil irregularity, right    old injury"pupil will not contract to light"  . Shortness of breath    exertion, asthma attack    Surgical History: Past Surgical History:  Procedure Laterality Date  . CARDIAC CATHETERIZATION     2006- 1 week prior tp CABPG  . CATARACT EXTRACTION    . COLONOSCOPY WITH PROPOFOL N/A 04/27/2016   Procedure: COLONOSCOPY WITH PROPOFOL;  Surgeon: Charolett Bumpers, MD;  Location: [facility] ENDOSCOPY;  Service: Endoscopy;  Laterality: N/A;  . CORONARY ARTERY BYPASS GRAFT  3/06   x4 vessel bypass [facility]-No problems since.  Marland Kitchen EYE SURGERY     rebuild eye socket (R)  . HIP PINNING,CANNULATED Left 10/01/2019  . HIP PINNING,CANNULATED Left 10/01/2019   Procedure: CANNULATED HIP  PINNING;  Surgeon: Roby Lofts, MD;  Location: [facility] OR;  Service: Orthopedics;  Laterality: Left;  . KNEE ARTHROSCOPY Left    left knee scope  . LUMBAR DISC SURGERY  2011  . LUMBAR LAMINECTOMY/DECOMPRESSION MICRODISCECTOMY Left 11/07/2013   Procedure: LUMBAR LAMINECTOMY/DECOMPRESSION MICRODISCECTOMY 1 LEVEL lumbar two/three;  Surgeon: Tia Alert, MD;  Location: [facility] NEURO ORS;  Service: Neurosurgery;  Laterality: Left;  . NASAL SINUS SURGERY    . SHOULDER SURGERY      Home Medications:  Allergies as of 06/16/2020      Reactions   Aspirin Hives   Codeine Hives   Fluticasone-salmeterol Hives   Montelukast Sodium Hives   Adhesive [tape] Rash   Cephalexin Rash   Ibuprofen Rash   Latex Rash   Statins Rash   Sulfonamide Derivatives Rash      Medication List       Accurate as of June 16, 2020  2:09 PM. If you have any questions, ask your nurse or doctor.        aspirin EC 81 MG tablet Take 81 mg by mouth daily.   Dulera 200-5 MCG/ACT Aero Generic drug: mometasone-formoterol Inhale 2 puffs into the lungs 2 (two) times daily.   escitalopram 10 MG tablet Commonly known as: LEXAPRO Take 10 mg by mouth daily.   ezetimibe 10 MG tablet Commonly known as:  ZETIA Take 1 tablet (10 mg total) by mouth daily.   fexofenadine 180 MG tablet Commonly known as: ALLEGRA Take 180 mg by mouth daily as needed for allergies.   lisinopril 10 MG tablet Commonly known as: ZESTRIL Take 10 mg by mouth daily.   LORazepam 1 MG tablet Commonly known as: ATIVAN Take 1 mg by mouth at bedtime as needed.   ProAir HFA 108 (90 Base) MCG/ACT inhaler Generic drug: albuterol Inhale 2 puffs into the lungs every 4 (four) hours as needed for wheezing.   Vitamin D-3 125 MCG (5000 UT) Tabs Take 5,000 Units by mouth daily.       Allergies:  Allergies  Allergen Reactions  . Aspirin Hives  . Codeine Hives  . Fluticasone-Salmeterol Hives  . Montelukast Sodium Hives  . Adhesive [Tape] Rash    . Cephalexin Rash  . Ibuprofen Rash  . Latex Rash  . Statins Rash  . Sulfonamide Derivatives Rash    Family History: Family History  Problem Relation Age of Onset  . Alzheimer's disease Mother   . Liver disease Sister   . Lung cancer Brother     Social History:  reports that he quit smoking about 27 years ago. His smoking use included cigarettes. He has a 37.00 pack-year smoking history. He has never used smokeless tobacco. He reports previous alcohol use. He reports that he does not use drugs.  ROS: All other review of systems were reviewed and are negative except what is noted above in HPI  Physical Exam: There were no vitals taken for this visit.  Constitutional:  Alert and oriented, No acute distress. HEENT: [place] AT, moist mucus membranes.  Trachea midline, no masses. Cardiovascular: No clubbing, cyanosis, or edema. Respiratory: Normal respiratory effort, no increased work of breathing. GI: Abdomen is soft, nontender, nondistended, no abdominal masses GU: No CVA tenderness.  Lymph: No cervical or inguinal lymphadenopathy. Skin: No rashes, bruises or suspicious lesions. Neurologic: Grossly intact, no focal deficits, moving all 4 extremities. Psychiatric: Normal mood and affect.  Laboratory Data: Lab Results  Component Value Date   WBC 6.9 10/03/2019   HGB 11.8 (L) 10/03/2019   HCT 34.7 (L) 10/03/2019   MCV 94.0 10/03/2019   PLT 174 10/03/2019    Lab Results  Component Value Date   CREATININE 0.99 10/03/2019    No results found for: PSA  No results found for: TESTOSTERONE  No results found for: HGBA1C  Urinalysis    Component Value Date/Time   COLORURINE YELLOW 03/01/2010 0757   APPEARANCEUR CLEAR 03/01/2010 0757   LABSPEC 1.020 03/01/2010 0757   PHURINE 5.0 03/01/2010 0757   GLUCOSEU NEGATIVE 03/01/2010 0757   HGBUR NEGATIVE 03/01/2010 0757   BILIRUBINUR NEGATIVE 03/01/2010 0757   KETONESUR NEGATIVE 03/01/2010 0757   PROTEINUR NEGATIVE 03/01/2010  0757   UROBILINOGEN 0.2 03/01/2010 0757   NITRITE NEGATIVE 03/01/2010 0757   LEUKOCYTESUR  03/01/2010 0757    NEGATIVE MICROSCOPIC NOT DONE ON URINES WITH NEGATIVE PROTEIN, BLOOD, LEUKOCYTES, NITRITE, OR GLUCOSE <1000 mg/dL.    No results found for: LABMICR, WBCUA, RBCUA, LABEPIT, MUCUS, BACTERIA  Pertinent Imaging:  No results found for this or any previous visit.  No results found for this or any previous visit.  No results found for this or any previous visit.  No results found for this or any previous visit.  No results found for this or any previous visit.  No results found for this or any previous visit.  No results found for this or  any previous visit.  No results found for this or any previous visit.   Assessment & Plan:    1. Erectile dysfunction due to arterial insufficiency -we discussed trimix therapy and the patient will call with his decision  2. BPH with LUTS, urinary hesitancy -The patient defers therapy at this time   No follow-ups on file.  Wilkie Aye, MD  [facility] [place]

## 2020-06-16 NOTE — Progress Notes (Signed)
Urological Symptom Review  Patient is experiencing the following symptoms: Frequent urination Stream starts and stops Trouble starting stream Weak stream Erection problems (male only)   Review of Systems  Gastrointestinal (upper)  : Indigestion/heartburn  Gastrointestinal (lower) : Negative for lower GI symptoms  Constitutional : Negative for symptoms  Skin: Itching  Eyes: Negative for eye symptoms  Ear/Nose/Throat : Negative for Ear/Nose/Throat symptoms  Hematologic/Lymphatic: Negative for Hematologic/Lymphatic symptoms  Cardiovascular : Negative for cardiovascular symptoms  Respiratory : Cough Shortness of Breath Endocrine: Negative for endocrine symptoms  Musculoskeletal: Back pain  Neurological: Negative for neurological symptoms  Psychologic: Negative for psychiatric symptoms

## 2020-06-16 NOTE — Patient Instructions (Signed)
Penile Prosthesis Implantation Penile prosthesis implantation is a procedure to put a device that treats erectile dysfunction into the penis. There are two main types of devices that can be put in during the procedure: malleable penile implants and inflatable penile implants. Malleable penile implant A malleable penile implant, also called a non-hydraulic or semi-rigid implant, consists of two silicone rubber rods. The rods provide some rigidity. They are also flexible, so the penis can both curve downward in its normal position and become straight for sexual intercourse. Inflatable penile implant  An inflatable penile implant, also called a hydraulic implant, consists of cylinders, a pump, and a reservoir. The cylinders can be inflated with a fluid that helps to create an erection, and they can be deflated after intercourse. There are several types of inflatable implants. Tell a health care provider about:  Any allergies you have.  All medicines you are taking, including vitamins, herbs, eye drops, creams, and over-the-counter medicines.  Any problems you or family members have had with anesthetic medicines.  Any blood disorders you have.  Any surgeries you have had.  Any medical conditions you have. What are the risks? Generally, this is a safe procedure. However, problems may occur, including:  Infection in the penis. If this happens, the implant may need to be removed.  Bleeding.  Allergic reaction to medicines.  Damage to other structures or organs, such as the tube that drains urine from the body (urethra).  Not enough blood reaching the penis. This is rare. If this happens, the implant will need to be removed. What happens before the procedure? Medicines  Ask your health care provider about: ? Changing or stopping your regular medicines. This is especially important if you are taking diabetes medicines or blood thinners. ? Taking medicines such as aspirin and ibuprofen.  These medicines can thin your blood. Do not take these medicines before your procedure if your health care provider instructs you not to. Staying hydrated Follow instructions from your health care provider about hydration, which may include:  Up to 2 hours before the procedure - you may continue to drink clear liquids, such as water, clear fruit juice, black coffee, and plain tea. Eating and drinking restrictions Follow instructions from your health care provider about eating and drinking, which may include:  8 hours before the procedure - stop eating heavy meals or foods such as meat, fried foods, or fatty foods.  6 hours before the procedure - stop eating light meals or foods, such as toast or cereal.  6 hours before the procedure - stop drinking milk or drinks that contain milk.  2 hours before the procedure - stop drinking clear liquids. General instructions  You may be asked to shower with a germ-killing soap.  Plan to have someone take you home from the hospital or clinic.  If you will be going home right after the procedure, plan to have someone with you for 24 hours. What happens during the procedure?  To lower your risk of infection: ? Your health care team will wash or sanitize their hands. ? Hair may be removed from the surgical area. ? Your skin will be washed with soap. ? You may be given antibiotic medicine.  An IV tube will be inserted into one of your veins.  You will be given one or more of the following: ? A medicine to make you fall asleep (general anesthetic). ? A medicine that is injected into your spine to numb the area below and slightly above   the injection site (spinal anesthetic).  A flexible tube (catheter) may be inserted into your urethra and bladder. The catheter drains urine during the procedure and helps your surgeon easily locate your urethra.  A small incision will be made in your scrotum or in your penis, just below the head of your  penis.  The cylinders of the prosthesis will be put into tissue on each side of your penis.  If you will have an inflatable penile implant: ? Incisions will be made in your abdomen and in your scrotum. These incisions will be used to insert the pump and the reservoir. ? The cylinders, reservoir, and pump will be joined by tubes and tested.  Your incision(s) will be closed with dissolvable stitches (sutures).  A bandage (dressing) will be applied to your incision(s).  You may be fitted with a device similar to a jock strap or underwear with a supportive pouch (scrotal support) to relieve pressure on the incision area. The procedure may vary among health care providers and hospitals. What happens after the procedure?  Your blood pressure, heart rate, breathing rate, and blood oxygen level will be monitored until the medicines you were given have worn off.  If you have a catheter in place, it may stay in place for the day after the procedure.  You may be given antibiotics or pain medicines as needed.  You may need to follow a clear liquid diet for the first 24 hours after the procedure.  You may be encouraged to sit up and walk around.  A towel roll or an ice pack may be placed under your scrotum to help reduce swelling.  Do not drive for 24 hours if you were given a sedative. Summary  Penile prosthesis implantation is a procedure to put a device that treats erectile dysfunction into the penis.  There are two main types of devices that can be put in during the procedure: malleable penile implants and inflatable penile implants.  After the procedure, you may be fitted with a device similar to a jock strap or underwear with a supportive pouch (scrotal support) to relieve pressure on the incision area. This information is not intended to replace advice given to you by your health care provider. Make sure you discuss any questions you have with your health care provider. Document  Revised: 07/22/2017 Document Reviewed: 05/21/2016 Elsevier Patient Education  2020 [employer]  

## 2020-07-06 NOTE — Progress Notes (Signed)
Cardiology Office Note:   Date:  07/07/2020  NAME:  Thomas [name]    MRN: [id number] DOB:  [birth year]   PCP:  Kirby Funk, MD  Cardiologist:  Reatha Harps, MD   Referring MD: Kirby Funk, MD   Chief Complaint  Patient presents with  . Follow-up   History of Present Illness:   Thomas [name] is a 79 y.o. male with a hx of CAD s/p CABG, AAA, HTN, HLD who presents for follow-up zetia was added to regimen.  He is tolerating Zetia well.  His most recent lipid profile shows a total cholesterol 129, HDL 33, LDL 69, triglycerides 156.  His AAA is stable.  He will see Dr. Arbie Cookey yearly.  He has no chest pain or trouble breathing.  His blood pressure is a bit elevated today but he reports that at home his blood pressure remains within normal limits.  He denies any major issues today in office.  He does present with his wife Thomas [name] who also is a patient of mine date of birth 08/05/1936.  I did congratulate him on his cholesterol being at goal.  Blood pressure appears to be stable at home.  Value today is an outlier.  Problem List 1. AAA -4.8 cm 10/18/2019 2. CAD -CABG x 4 2006 ([facility]) -LIMA-LAD, SVG-RI, SVG-OM, SVG-PDA 3. HTN 4. HLD -Total cholesterol 129, HDL 33, LDL 69, triglycerides 156 5. Prior tobacco abuse   Past Medical History: Past Medical History:  Diagnosis Date  . AAA (abdominal aortic aneurysm) (HCC)    4.2 cm by CTA 10/01/13- states "much smaller than first expected"Dr. Valentina Lucks follows-checks every 2 yrs  . Allergic asthma   . Allergic rhinitis   . Anxiety depression  . Arthritis    torn rotator cuff left shoulder  . Bruise    left chest area"recent fall"-no rib fracture identified.  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 Colitis   . GERD (gastroesophageal reflux disease)   . History of measles   . History of mumps   . HLD (hyperlipidemia)   . HTN (hypertension)   . Obesity   . Polio    left leg  . Pupil irregularity,  right    old injury"pupil will not contract to light"  . Shortness of breath    exertion, asthma attack    Past Surgical History: Past Surgical History:  Procedure Laterality Date  . CARDIAC CATHETERIZATION     2006- 1 week prior tp CABPG  . CATARACT EXTRACTION    . COLONOSCOPY WITH PROPOFOL N/A 04/27/2016   Procedure: COLONOSCOPY WITH PROPOFOL;  Surgeon: Charolett Bumpers, MD;  Location: [facility];  Service: Endoscopy;  Laterality: N/A;  . CORONARY ARTERY BYPASS GRAFT  3/06   x4 vessel bypass [facility]-No problems since.  Marland Kitchen EYE SURGERY     rebuild eye socket (R)  . HIP PINNING,CANNULATED Left 10/01/2019  . HIP PINNING,CANNULATED Left 10/01/2019   Procedure: CANNULATED HIP PINNING;  Surgeon: Roby Lofts, MD;  Location: [facility] OR;  Service: Orthopedics;  Laterality: Left;  . KNEE ARTHROSCOPY Left    left knee scope  . LUMBAR DISC SURGERY  2011  . LUMBAR LAMINECTOMY/DECOMPRESSION MICRODISCECTOMY Left 11/07/2013   Procedure: LUMBAR LAMINECTOMY/DECOMPRESSION MICRODISCECTOMY 1 LEVEL lumbar two/three;  Surgeon: Tia Alert, MD;  Location: [facility]UR[facility];  Service: Neurosurgery;  Laterality: Left;  . NASAL SINUS SURGERY    . SHOULDER SURGERY      Current  Medications: Current Meds  Medication Sig  . aspirin EC 81 MG tablet Take 81 mg by mouth daily.  . Cholecalciferol (VITAMIN D-3) 125 MCG (5000 UT) TABS Take 5,000 Units by mouth daily.  . DULERA 200-5 MCG/ACT AERO Inhale 2 puffs into the lungs 2 (two) times daily.  Marland Kitchen. escitalopram (LEXAPRO) 10 MG tablet Take 10 mg by mouth daily.  Marland Kitchen. ezetimibe (ZETIA) 10 MG tablet Take 1 tablet (10 mg total) by mouth daily.  . fexofenadine (ALLEGRA) 180 MG tablet Take 180 mg by mouth daily as needed for allergies.   Marland Kitchen. lisinopril (PRINIVIL,ZESTRIL) 10 MG tablet Take 10 mg by mouth daily.   Marland Kitchen. LORazepam (ATIVAN) 1 MG tablet Take 1 mg by mouth at bedtime as needed.  Marland Kitchen. PROAIR HFA 108 (90 Base) MCG/ACT inhaler Inhale 2 puffs into the lungs every 4 (four) hours as  needed for wheezing.   . [DISCONTINUED] ezetimibe (ZETIA) 10 MG tablet Take 1 tablet (10 mg total) by mouth daily.     Allergies:    Aspirin, Codeine, Fluticasone-salmeterol, Montelukast sodium, Adhesive [tape], Cephalexin, Ibuprofen, Latex, Statins, and Sulfonamide derivatives   Social History: Social History   Socioeconomic History  . Marital status: Married    Spouse name: Not on file  . Number of children: 0  . Years of education: Not on file  . Highest education level: Not on file  Occupational History  . Occupation: Press photographershuttle driver    Employer: [employer]    Comment: [employer]  Tobacco Use  . Smoking status: Former Smoker    Packs/day: 1.00    Years: 37.00    Pack years: 37.00    Types: Cigarettes    Quit date: 08/23/1992    Years since quitting: 27.8  . Smokeless tobacco: Never Used  Vaping Use  . Vaping Use: Never used  Substance and Sexual Activity  . Alcohol use: Not Currently    Comment: socially   . Drug use: No  . Sexual activity: Not on file  Other Topics Concern  . Not on file  Social History Narrative  . Not on file   Social Determinants of Health   Financial Resource Strain:   . Difficulty of Paying Living Expenses: Not on file  Food Insecurity:   . Worried About Programme researcher, broadcasting/film/videounning Out of Food in the Last Year: Not on file  . Ran Out of Food in the Last Year: Not on file  Transportation Needs:   . Lack of Transportation (Medical): Not on file  . Lack of Transportation (Non-Medical): Not on file  Physical Activity:   . Days of Exercise per Week: Not on file  . Minutes of Exercise per Session: Not on file  Stress:   . Feeling of Stress : Not on file  Social Connections:   . Frequency of Communication with Friends and Family: Not on file  . Frequency of Social Gatherings with Friends and Family: Not on file  . Attends Religious Services: Not on file  . Active Member of Clubs or Organizations: Not on file  . Attends BankerClub or Organization Meetings: Not on  file  . Marital Status: Not on file    Family History: The patient's family history includes Alzheimer's disease in his mother; Liver disease in his sister; Lung cancer in his brother.  ROS:   All other ROS reviewed and negative. Pertinent positives noted in the HPI.     EKGs/Labs/Other Studies Reviewed:   The following studies were personally reviewed by me today:  Vasc US 05/06/2020 Summary:  Abdominal Aorta: There is evidence of abnormal dilatation of the distal  Abdominal aorta. The largest aortic measurement is 4.8 cm. Previous  diameter measurement was 4.8 cm obtained on 10/18/2019.     Recent Labs: 10/01/2019: ALT 21; TSH 1.974 10/03/2019: BUN 26; Creatinine, Ser 0.99; Hemoglobin 11.8; Platelets 174; Potassium 3.9; Sodium 137   Recent Lipid Panel    Component Value Date/Time   CHOL 170 03/05/2011 0835   TRIG 89.0 03/05/2011 0835   HDL 48.20 03/05/2011 0835   CHOLHDL 4 03/05/2011 0835   VLDL 17.8 03/05/2011 0835   LDLCALC 104 (H) 03/05/2011 0835    Physical Exam:   VS:  BP (!) 150/64 (BP Location: Left Arm, Patient Position: Sitting, Cuff Size: Normal)   Pulse 62   Ht 5\' 8"  (1.727 m)   Wt 181 lb (82.1 kg)   BMI 27.52 kg/m    Wt Readings from Last 3 Encounters:  07/07/20 181 lb (82.1 kg)  06/16/20 182 lb (82.6 kg)  05/12/20 182 lb (82.6 kg)    General: Well nourished, well developed, in no acute distress Heart: Atraumatic, normal size  Eyes: PEERLA, EOMI  Neck: Supple, no JVD Endocrine: No thryomegaly Cardiac: Normal S1, S2; RRR; no murmurs, rubs, or gallops Lungs: Clear to auscultation bilaterally, no wheezing, rhonchi or rales  Abd: Soft, nontender, no hepatomegaly  Ext: No edema, pulses 2+ Musculoskeletal: No deformities, BUE and BLE strength normal and equal Skin: Warm and dry, no rashes   Neuro: Alert and oriented to person, place, time, and situation, CNII-XII grossly intact, no focal deficits  Psych: Normal mood and affect   ASSESSMENT:   BROCKTON MCKESSON is a 79 y.o. male who presents for the following: 1. AAA (abdominal aortic aneurysm) without rupture (HCC)   2. Coronary artery disease involving coronary bypass graft of native heart without angina pectoris   3. Mixed hyperlipidemia   4. Essential hypertension     PLAN:   1. AAA (abdominal aortic aneurysm) without rupture (HCC) -AAA stable at 4.8 cm.  Followed by vascular surgery.  They will follow him yearly given stability.  2. Coronary artery disease involving coronary bypass graft of native heart without angina pectoris -Status post CABG in 2006.  No signs of angina.  Continue aspirin.  He is intolerant of statins.  He is on Zetia.  Most recent LDL cholesterol is at goal at 69.  3. Mixed hyperlipidemia -Statin intolerant.  Continue Zetia 10 mg daily.  Most recent LDL 69.  4. Essential hypertension -BP a bit elevated today.  Reports at home systolic blood pressure is around 130.  We will continue to monitor at home.   Disposition: Return in about 1 year (around 07/07/2021).  Medication Adjustments/Labs and Tests Ordered: Current medicines are reviewed at length with the patient today.  Concerns regarding medicines are outlined above.  No orders of the defined types were placed in this encounter.  Meds ordered this encounter  Medications  . ezetimibe (ZETIA) 10 MG tablet    Sig: Take 1 tablet (10 mg total) by mouth daily.    Dispense:  90 tablet    Refill:  3    Patient Instructions  Medication Instructions:  NO CHANGES*If you need a refill on your cardiac medications before your next appointment, please call your pharmacy*   Lab Work: NONE If you have labs (blood work) drawn today and your tests are completely normal, you will receive your results only by: 07/09/2021 MyChart  Message (if you have MyChart) OR . A paper copy in the mail If you have any lab test that is abnormal or we need to change your treatment, we will call you to review the  results.   Testing/Procedures: NONE   Follow-Up: At [facility], you and your health needs are our priority.  As part of our continuing mission to provide you with exceptional heart care, we have created designated Provider Care Teams.  These Care Teams include your primary Cardiologist (physician) and Advanced Practice Providers (APPs -  Physician Assistants and Nurse Practitioners) who all work together to provide you with the care you need, when you need it.  We recommend signing up for the patient portal called "MyChart".  Sign up information is provided on this After Visit Summary.  MyChart is used to connect with patients for Virtual Visits (Telemedicine).  Patients are able to view lab/test results, encounter notes, upcoming appointments, etc.  Non-urgent messages can be sent to your provider as well.   To learn more about what you can do with MyChart, go to [email or web address].    Your next appointment:   1 year(s)  The format for your next appointment:   In Person  Provider:   Lennie Odor, MD   Other Instructions NONE     Time Spent with Patient: I have spent a total of 25 minutes with patient reviewing hospital notes, telemetry, EKGs, labs and examining the patient as well as establishing an assessment and plan that was discussed with the patient.  > 50% of time was spent in direct patient care.  Signed, Lenna Gilford. Flora Lipps, MD [facility]  [address], [address] [place], [place] [address] [phone]  07/07/2020 3:52 PM

## 2020-07-07 ENCOUNTER — Ambulatory Visit (INDEPENDENT_AMBULATORY_CARE_PROVIDER_SITE_OTHER): Payer: Medicare Other | Admitting: Cardiovascular Disease

## 2020-07-07 ENCOUNTER — Encounter: Payer: Self-pay | Admitting: Cardiovascular Disease

## 2020-07-07 VITALS — BP 150/64 | HR 62 | Ht 68.0 in | Wt 181.0 lb

## 2020-07-07 DIAGNOSIS — I25810 Atherosclerosis of coronary artery bypass graft(s) without angina pectoris: Secondary | ICD-10-CM

## 2020-07-07 DIAGNOSIS — I714 Abdominal aortic aneurysm, without rupture, unspecified: Secondary | ICD-10-CM

## 2020-07-07 DIAGNOSIS — I10 Essential (primary) hypertension: Secondary | ICD-10-CM

## 2020-07-07 DIAGNOSIS — E782 Mixed hyperlipidemia: Secondary | ICD-10-CM | POA: Diagnosis not present

## 2020-07-07 MED ORDER — EZETIMIBE 10 MG PO TABS: 10.0000 mg | ORAL_TABLET | Freq: Every day | ORAL | 3 refills | Status: DC

## 2020-07-07 NOTE — Patient Instructions (Signed)

## 2021-03-08 ENCOUNTER — Ambulatory Visit
Admission: EM | Admit: 2021-03-08 | Discharge: 2021-03-08 | Disposition: A | Discharge status: Home / Self Care | Payer: Medicare Other | Attending: Family Medicine | Admitting: Family Medicine

## 2021-03-08 ENCOUNTER — Encounter: Payer: Self-pay | Admitting: Emergency Medicine

## 2021-03-08 DIAGNOSIS — R21 Rash and other nonspecific skin eruption: Secondary | ICD-10-CM

## 2021-03-08 DIAGNOSIS — I10 Essential (primary) hypertension: Secondary | ICD-10-CM | POA: Diagnosis not present

## 2021-03-08 MED ORDER — DEXAMETHASONE SODIUM PHOSPHATE 10 MG/ML IJ SOLN
10.0000 mg | Freq: Once | INTRAMUSCULAR | Status: AC
  Administered 2021-03-08: 10 mg via INTRAMUSCULAR

## 2021-03-08 MED ORDER — TRIAMCINOLONE ACETONIDE 0.1 % EX CREA: 1.0000 "application " | TOPICAL_CREAM | Freq: Two times a day (BID) | CUTANEOUS | 0 refills | Status: DC

## 2021-03-08 NOTE — Discharge Instructions (Addendum)
Check your blood pressure tonight if your reading is greater than 170 top number and bottom number higher 90 take another dose of medication. 185/83 last blood pressure reading

## 2021-03-08 NOTE — ED Provider Notes (Signed)
Renaldo Fiddler    CSN: [id number] Arrival date & time: 03/08/21  1205      History   Chief Complaint No chief complaint on file.   HPI Thomas [name] is a 80 y.o. male.   HPI Patient presents today with a rash involving his neck and face which is erythematous and pruritic.  The rash has been present for 5 days.  Patient endorses that he had been working outside however is unaware of anything that he may have made contact with.  He however recalls sweating really badly and using a towel which she was uncertain if the child had been clean to wipe off his face and the rash abruptly developed later that evening.  The rash is not affecting any other extremities or his torso is localized to his face and neck.  He denies any shortness of breath or any difficulty swallowing.  He has been taking antihistamines without any relief of symptoms.  Past Medical History:  Diagnosis Date   AAA (abdominal aortic aneurysm) (HCC)    4.2 cm by CTA 10/01/13- states "much smaller than first expected"Dr. Valentina Lucks follows-checks every 2 yrs   Allergic asthma    Allergic rhinitis    Anxiety depression   Arthritis    torn rotator cuff left shoulder   Bruise    left chest area"recent fall"-no rib fracture identified.   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   Colitis    GERD (gastroesophageal reflux disease)    History of measles    History of mumps    HLD (hyperlipidemia)    HTN (hypertension)    Obesity    Polio    left leg   Pupil irregularity, right    old injury"pupil will not contract to light"   Shortness of breath    exertion, asthma attack    Patient Active Problem List   Diagnosis Date Noted   Erectile dysfunction due to arterial insufficiency 06/16/2020   AAA (abdominal aortic aneurysm) without rupture (HCC) 10/18/2019   Closed left hip fracture, initial encounter (HCC) 10/01/2019   Hip fracture (HCC) 10/01/2019   Nocturnal leg cramps 09/27/2014   S/P lumbar  microdiscectomy 11/07/2013   Lumbar disc disease 10/29/2013   CAD (coronary artery disease)    Obesity (BMI 30-39.9)    Pupil diameter unequal    Hyperlipidemia    Hypertensive heart disease    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   EYE SURGERY     rebuild eye socket (R)   HIP PINNING,CANNULATED Left 10/01/2019   HIP PINNING,CANNULATED Left 10/01/2019   Procedure: CANNULATED HIP PINNING;  Surgeon: Roby Lofts, MD;  Location: [facility] OR;  Service: Orthopedics;  Laterality: Left;   KNEE ARTHROSCOPY Left    left knee scope   LUMBAR DISC SURGERY  2011   LUMBAR LAMINECTOMY/DECOMPRESSION MICRODISCECTOMY Left 11/07/2013   Procedure: LUMBAR LAMINECTOMY/DECOMPRESSION MICRODISCECTOMY 1 LEVEL lumbar two/three;  Surgeon: Tia Alert, MD;  Location: [facility] NEURO  ORS;  Service: Neurosurgery;  Laterality: Left;   NASAL SINUS SURGERY     SHOULDER SURGERY         Home Medications    Prior to Admission medications   Medication Sig Start Date End Date Taking? Authorizing Provider  triamcinolone cream (KENALOG) 0.1 % Apply 1 application topically 2 (two) times daily. 03/08/21  Yes Bing Neighbors, FNP  aspirin EC 81 MG tablet Take 81 mg by mouth daily.    [provider]  Cholecalciferol (VITAMIN D-3) 125 MCG (5000 UT) TABS Take 5,000 Units by mouth daily. 10/03/19   Despina Hidden, PA-C  DULERA 200-5 MCG/ACT AERO Inhale 2 puffs into the lungs 2 (two) times daily. 05/29/19   [provider]  escitalopram (LEXAPRO) 10 MG  tablet Take 10 mg by mouth daily. 11/19/19   [provider]  ezetimibe (ZETIA) 10 MG tablet Take 1 tablet (10 mg total) by mouth daily. 07/07/20 10/05/20  Sande Rives, MD  fexofenadine (ALLEGRA) 180 MG tablet Take 180 mg by mouth daily as needed for allergies.     [provider]  lisinopril (PRINIVIL,ZESTRIL) 10 MG tablet Take 10 mg by mouth daily.     [provider]  LORazepam (ATIVAN) 1 MG tablet Take 1 mg by mouth at bedtime as needed. 11/19/19   [provider]  PROAIR HFA 108 (90 Base) MCG/ACT inhaler Inhale 2 puffs into the lungs every 4 (four) hours as needed for wheezing.  09/19/19   [provider]    Family History Family History  Problem Relation Age of Onset   Alzheimer's disease Mother    Liver disease Sister    Lung cancer Brother     Social History Social History   Tobacco Use   Smoking status: Former    Packs/day: 1.00    Years: 37.00    Pack years: 37.00    Types: Cigarettes    Quit date: 08/23/1992    Years since quitting: 28.5   Smokeless tobacco: Never  Vaping Use   Vaping Use: Never used  Substance Use Topics   Alcohol use: Not Currently    Comment: socially    Drug use: No     Allergies   Aspirin, Codeine, Fluticasone-salmeterol, Montelukast sodium, Adhesive [tape], Cephalexin, Ibuprofen, Latex, Statins, and Sulfonamide derivatives   Review of Systems Review of Systems Pertinent negatives listed in HPI   Physical Exam Triage Vital Signs ED Triage Vitals [03/08/21 1422]  Enc Vitals Group     BP (!) 210/85     Pulse Rate 68     Resp (!) 178     Temp 97.9 F (36.6 C)     Temp Source Oral     SpO2 98 %     Weight      Height      Head Circumference      Peak Flow      Pain Score      Pain Loc      Pain Edu?      Excl. in GC?    No data found.  Updated Vital Signs BP (!) 210/85 (BP Location: Right Arm) Comment: pt states he was late taking his bp meds  Pulse 68   Temp 97.9 F  (36.6 C) (Oral)   Resp (!) 178   SpO2 98%   Visual Acuity Right Eye Distance:   Left Eye Distance:   Bilateral Distance:    Right Eye Near:   Left Eye Near:  Bilateral Near:     Physical Exam Constitutional:      Appearance: Normal appearance.  Cardiovascular:     Rate and Rhythm: Normal rate and regular rhythm.  Pulmonary:     Effort: Pulmonary effort is normal.     Breath sounds: Normal breath sounds.  Musculoskeletal:     Cervical back: Normal range of motion.  Skin:    Comments: Diffuse erythematous patches involving the entire face with exclusion of the eyes and mouth and encompassing the entire neck.  Thickened leathery textured skin with no obvious blistering or papules.  Neurological:     Mental Status: He is alert.     UC Treatments / Results  Labs (all labs ordered are listed, but only abnormal results are displayed) Labs Reviewed - No data to display  EKG   Radiology No results found.  Procedures Procedures (including critical care time)  Medications Ordered in UC Medications  dexamethasone (DECADRON) injection 10 mg (10 mg Intramuscular Given 03/08/21 1509)    Initial Impression / Assessment and Plan / UC Course  I have reviewed the triage vital signs and the nursing notes.  Pertinent labs & imaging results that were available during my care of the patient were reviewed by me and considered in my medical decision making (see chart for details).     Facial rash of unknown etiology.  Treatment today with Decadron 10 mg IM.  Patient blood pressure is elevated here in clinic however he reports he only took his medication shortly after awakening and may be about 30 minutes prior to arrival here in clinic.  Patient has extensive cardiovascular disease advised to check his blood pressure again and if his readings remain greater than 170/80 to take a second dose of his lisinopril and follow-up with his primary care doctor in the morning.  Patient  verbalized understanding and agreement with plan. Final Clinical Impressions(s) / UC Diagnoses   Final diagnoses:  Facial rash  Accelerated hypertension     Discharge Instructions      Check your blood pressure tonight if your reading is greater than 170 top number and bottom number higher 90 take another dose of medication. 185/83 last blood pressure reading       ED Prescriptions     Medication Sig Dispense Auth. Provider   triamcinolone cream (KENALOG) 0.1 % Apply 1 application topically 2 (two) times daily. 60 g Bing Neighbors, FNP      PDMP not reviewed this encounter.   Bing Neighbors, [place] 03/12/21 [phone]

## 2021-03-08 NOTE — ED Triage Notes (Signed)
Itchy Rash to both sides of neck and face since Wednesday

## 2021-03-21 IMAGING — DX DG KNEE COMPLETE 4+V*L*
4 series · 4 of 4 positions shown · non-contrast
Comparison: None.

CLINICAL DATA: Fall and pain

EXAM:
LEFT KNEE - COMPLETE 4+ VIEW

[knee ap]
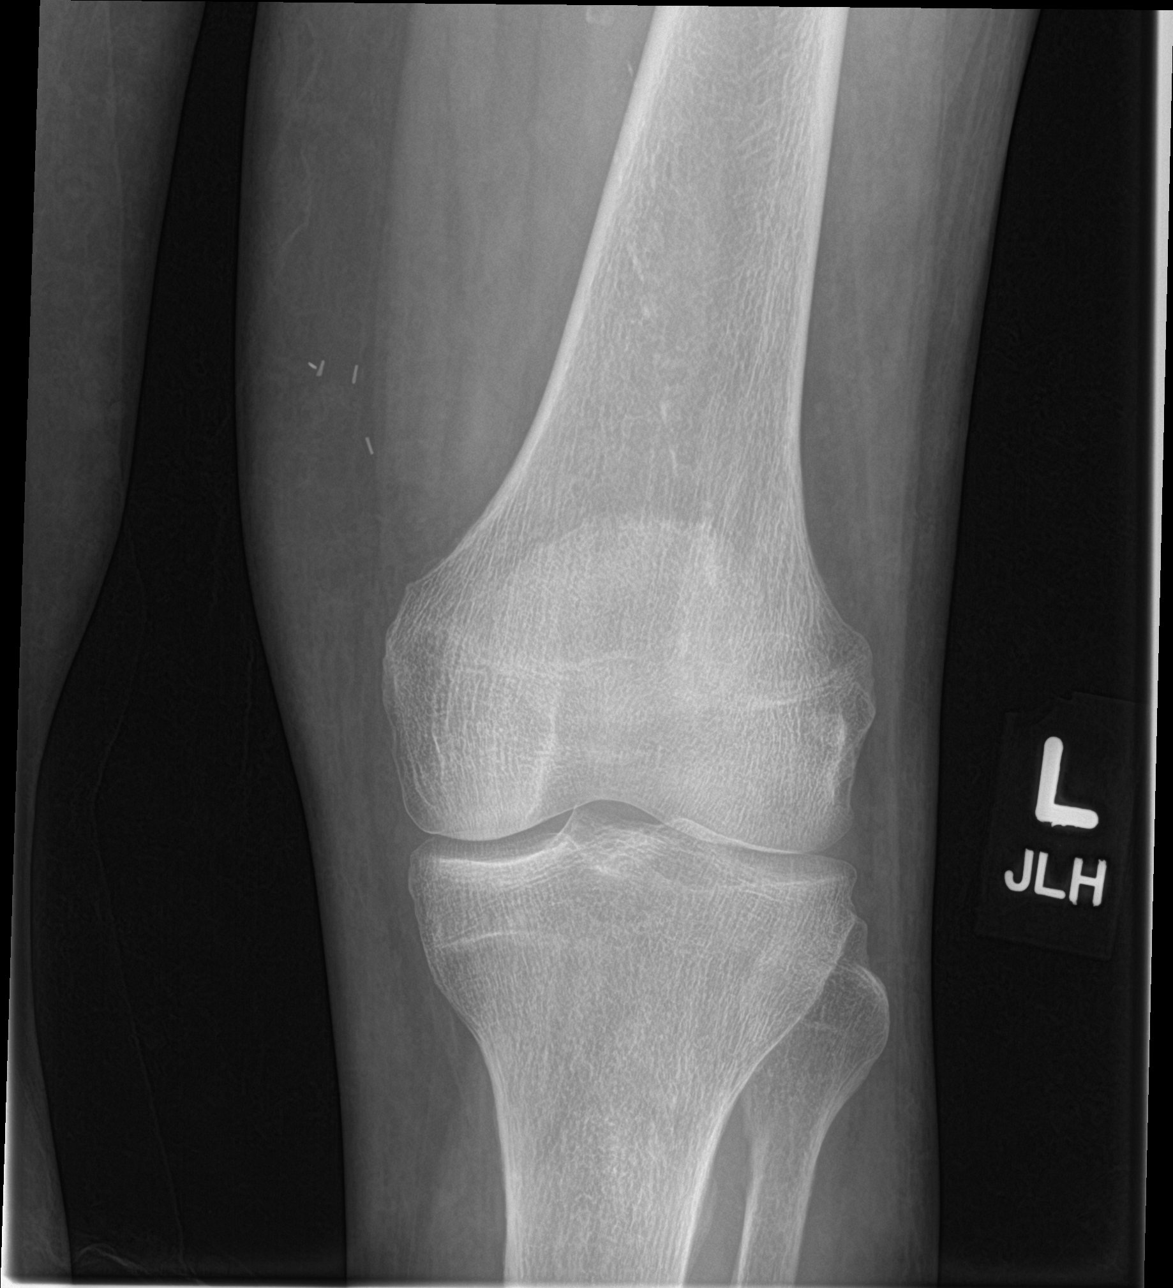

[knee lat]
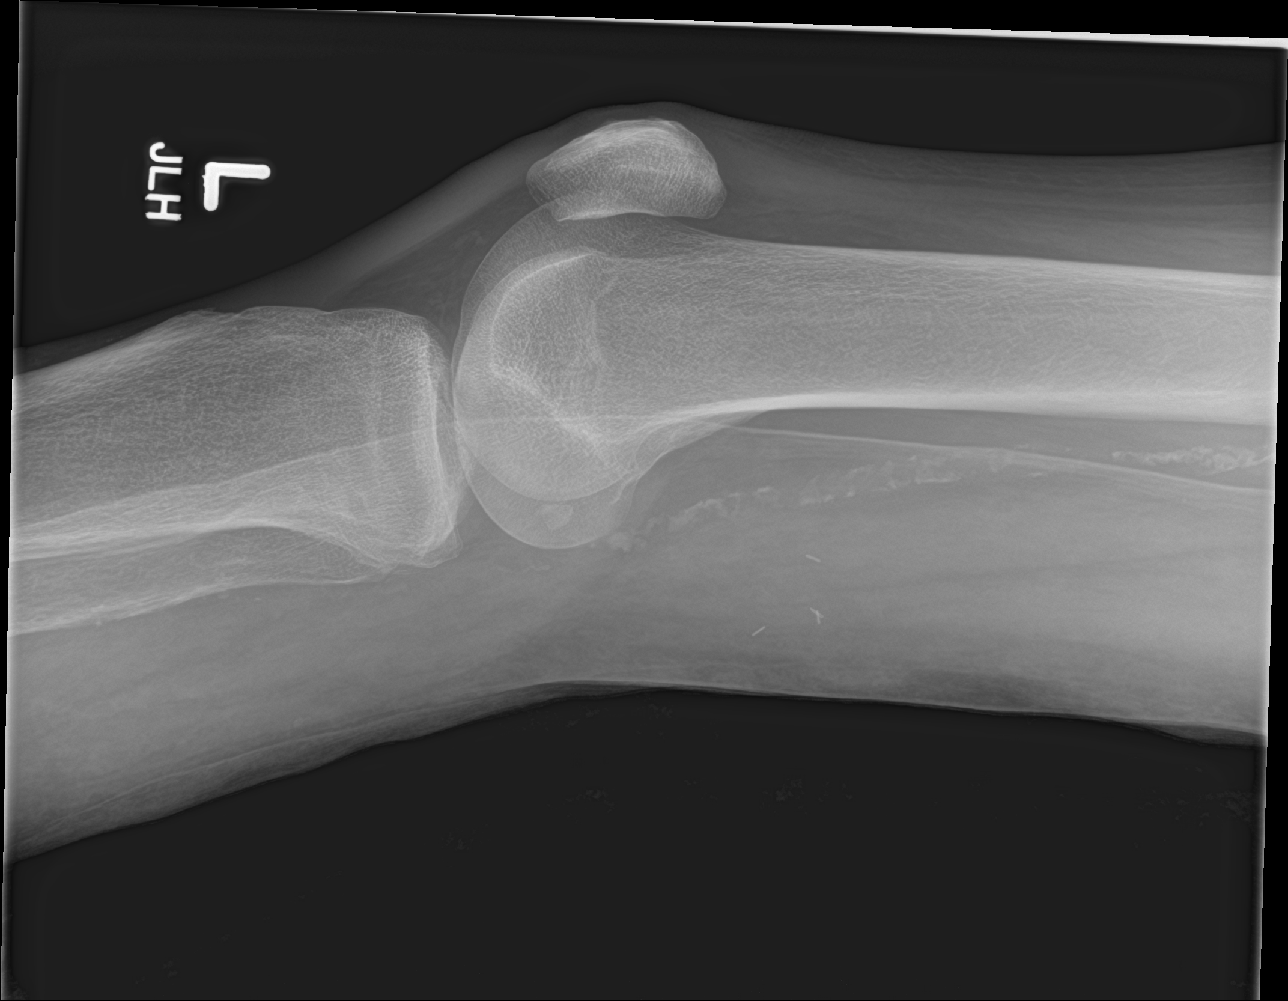

[knee obl (1 of 2)]
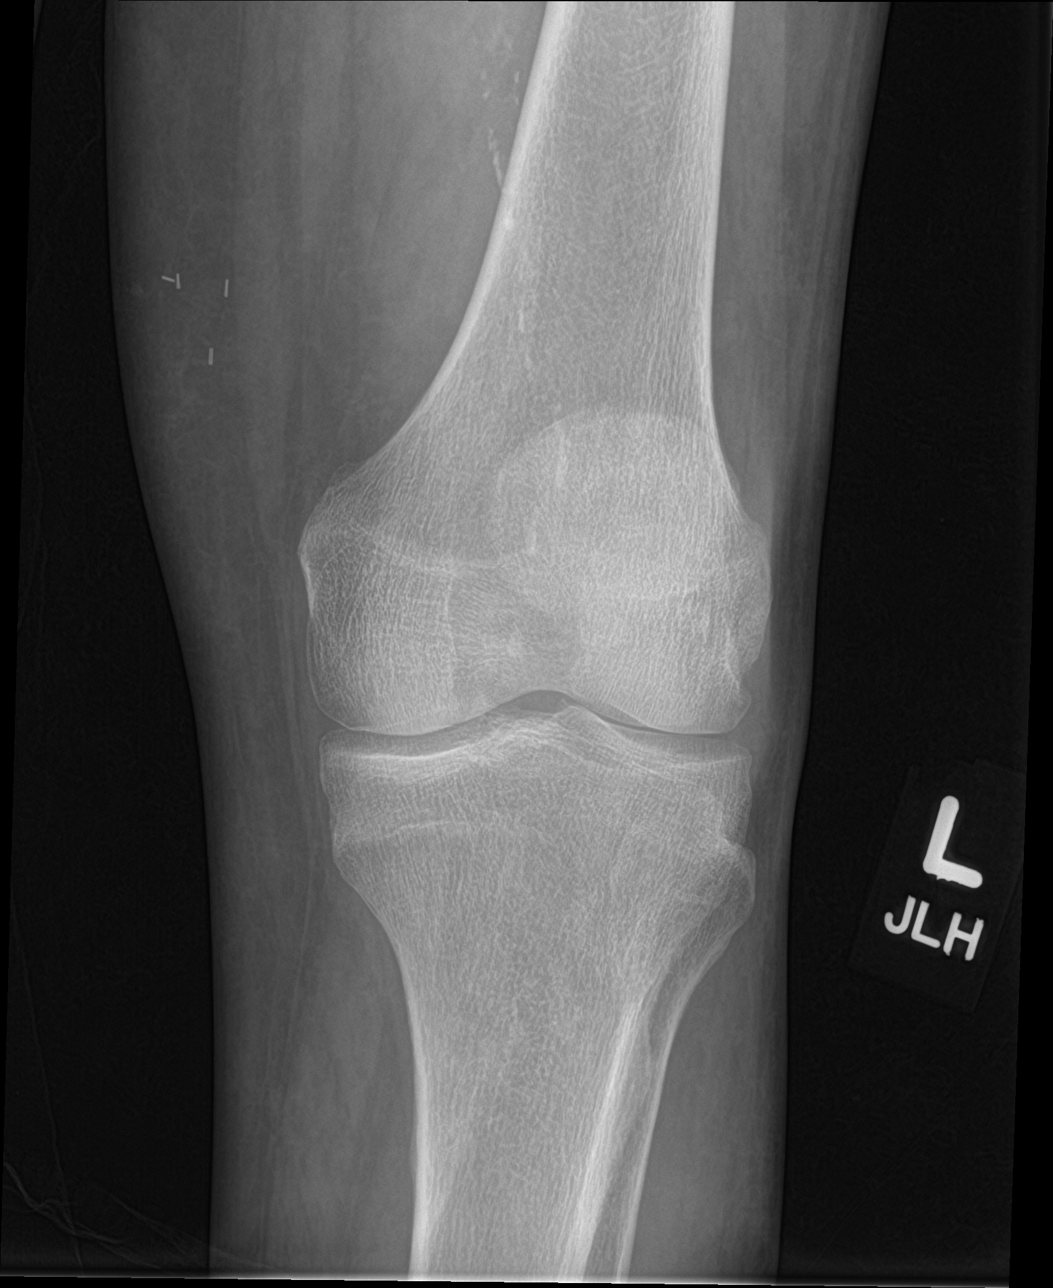

[knee obl (2 of 2)]
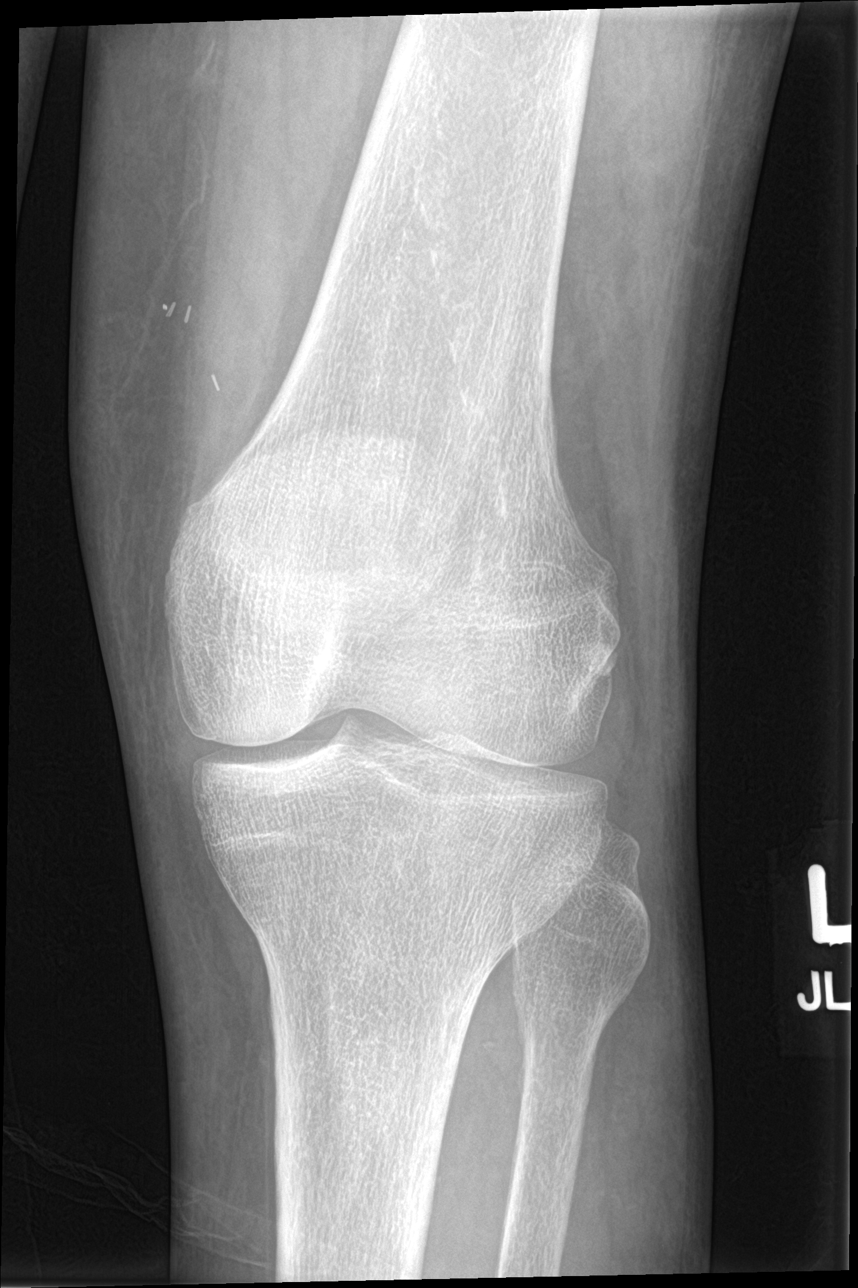

[4 of 4 positions shown; findings below may reference images not displayed]

FINDINGS: No evidence of fracture, dislocation, or joint effusion. Mild
tricompartmental osteoarthritis is seen with joint space loss and
subchondral sclerosis. Dense vascular calcifications are seen.
Surgical clips in the posterior knee.
IMPRESSION: No acute osseous abnormality.

## 2021-03-21 IMAGING — DX DG FEMUR 2+V*L*
4 series · 4 of 4 positions shown · non-contrast
Comparison: None.

CLINICAL DATA: Status post fall.

EXAM:
LEFT FEMUR 2 VIEWS

[femur ap (1 of 2)]
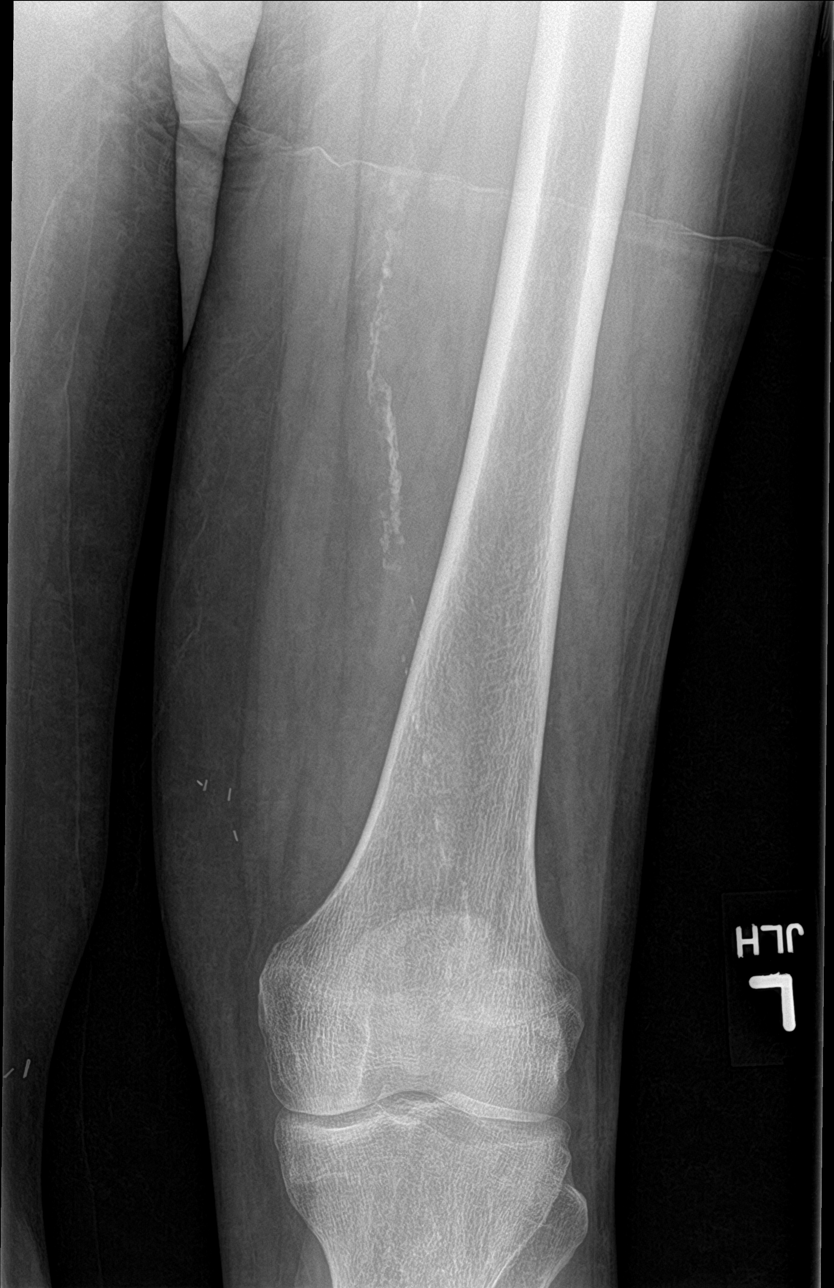

[femur ap (2 of 2)]
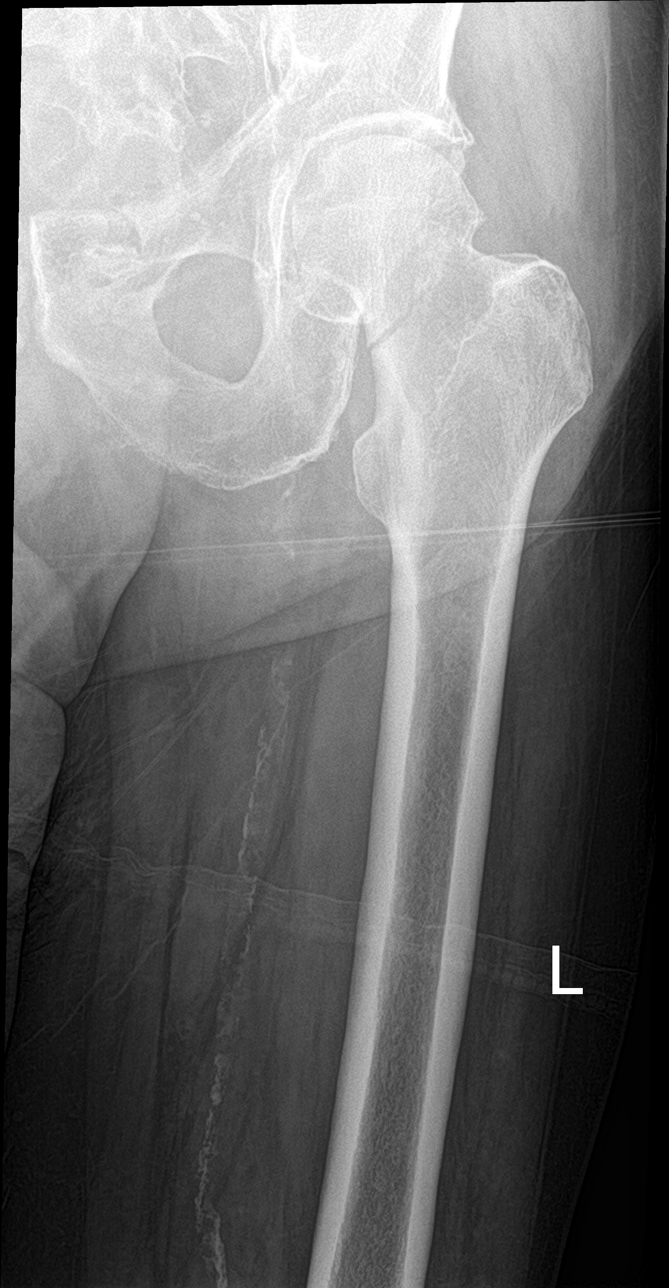

[femur lat (1 of 2)]
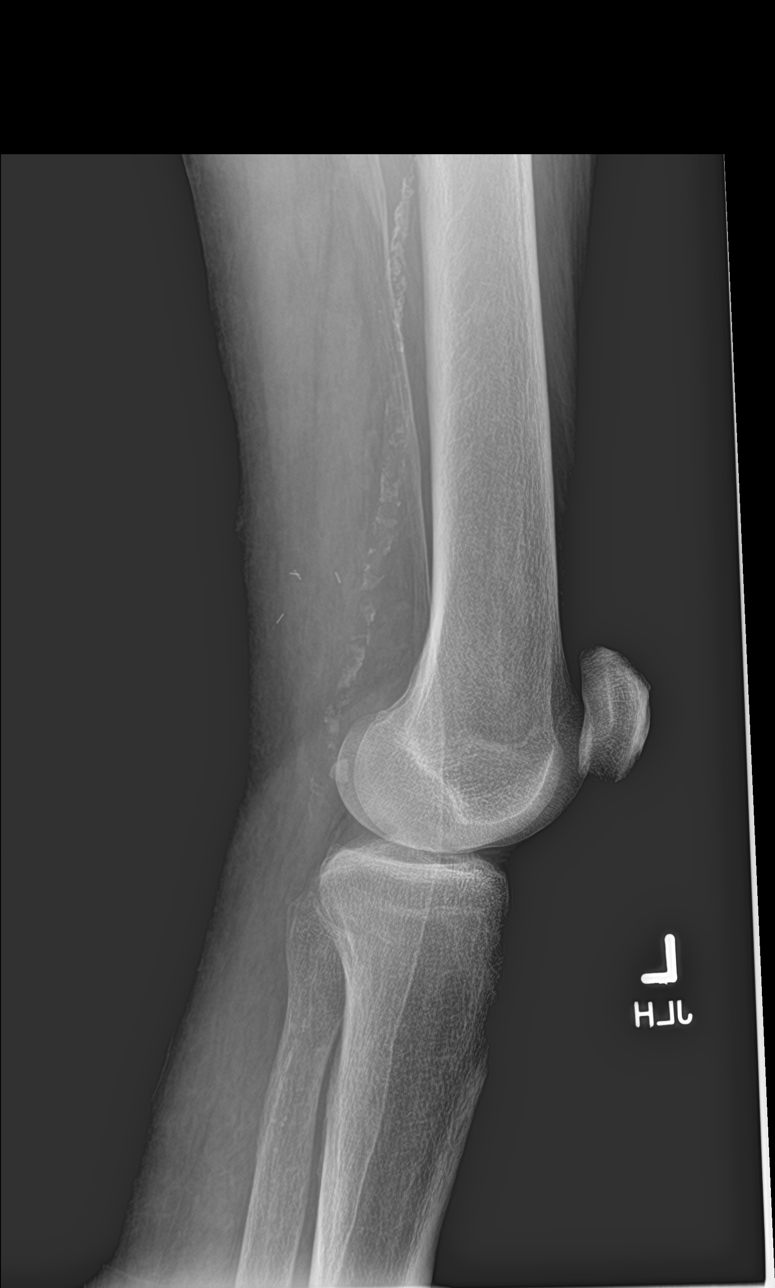

[femur lat (2 of 2)]
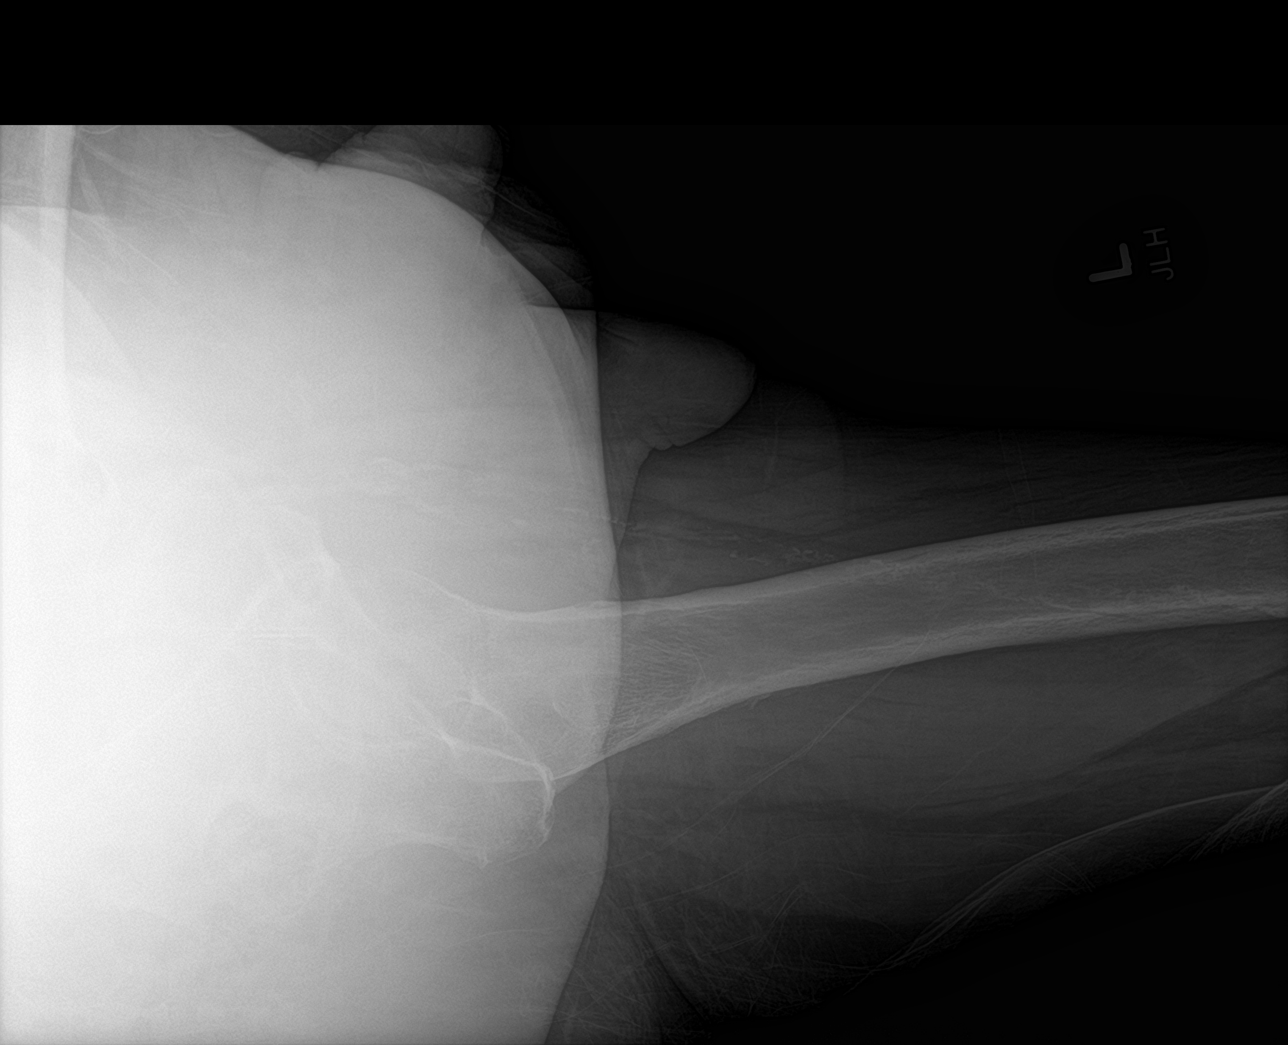

[4 of 4 positions shown; findings below may reference images not displayed]

FINDINGS: Acute nondisplaced fracture is seen extending through the neck of
the proximal left femur. There is no evidence of an associated
dislocation. Mild vascular calcification is noted.
IMPRESSION: Acute fracture of the proximal left femur.

## 2021-03-22 IMAGING — RF DG C-ARM 1-60 MIN
1 series · 8 of 8 positions shown · non-contrast
Comparison: Films from the previous day.

CLINICAL DATA: Known left hip fracture

EXAM:
OPERATIVE LEFT HIP WITH PELVIS; DG C-ARM 1-60 MIN

[Series 1: run · 8 of 8 slices shown]
[im 1/8]
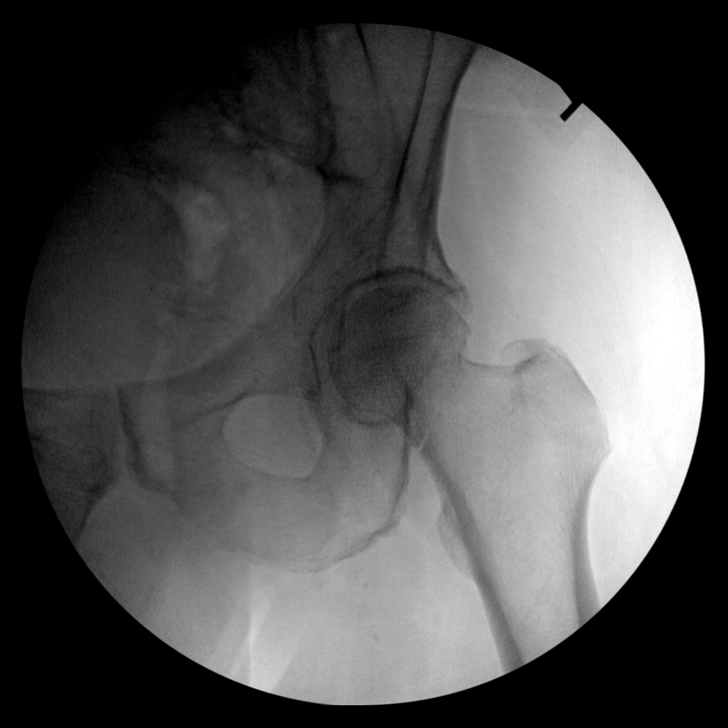
[im 2/8]
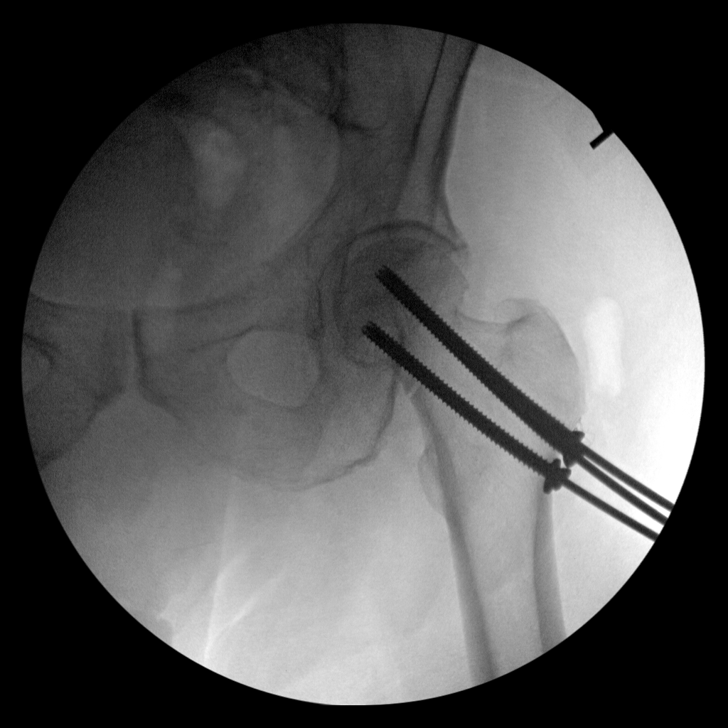
[im 3/8]
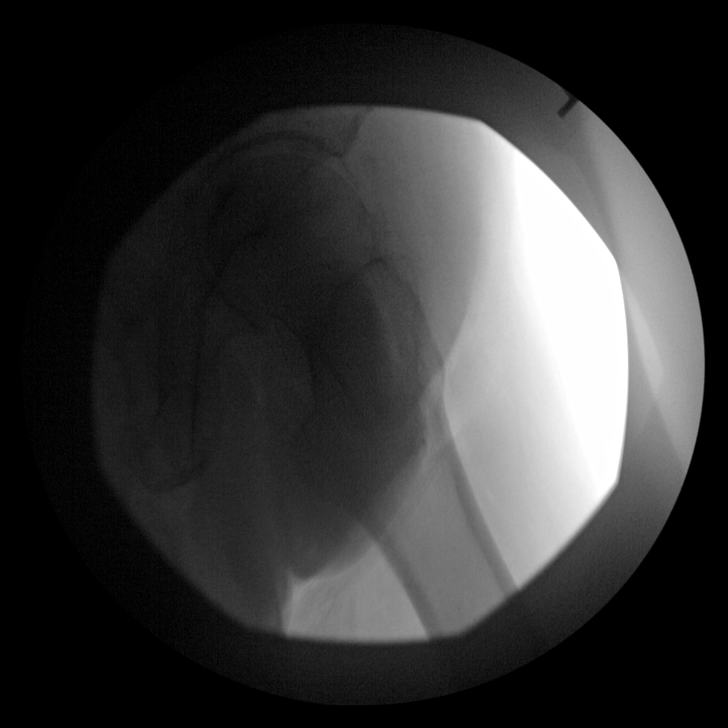
[im 4/8]
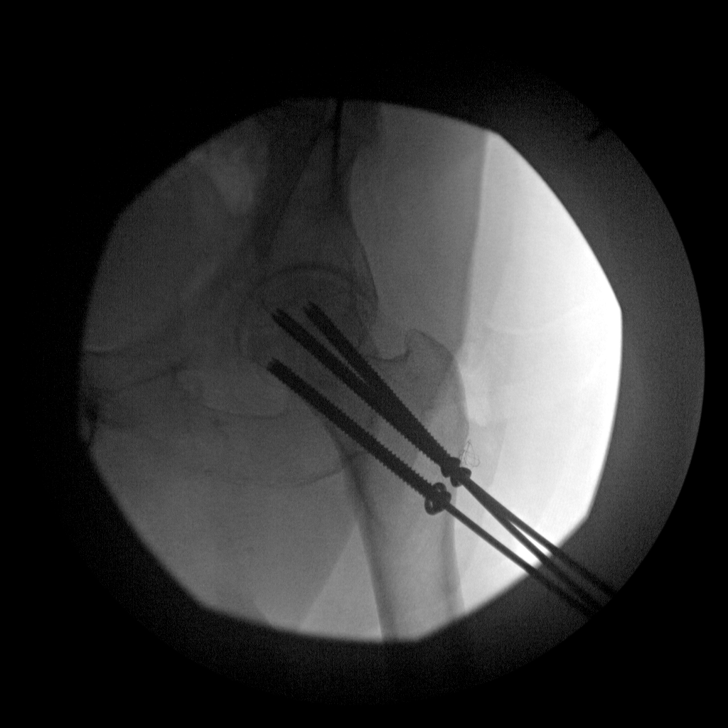
[im 5/8]
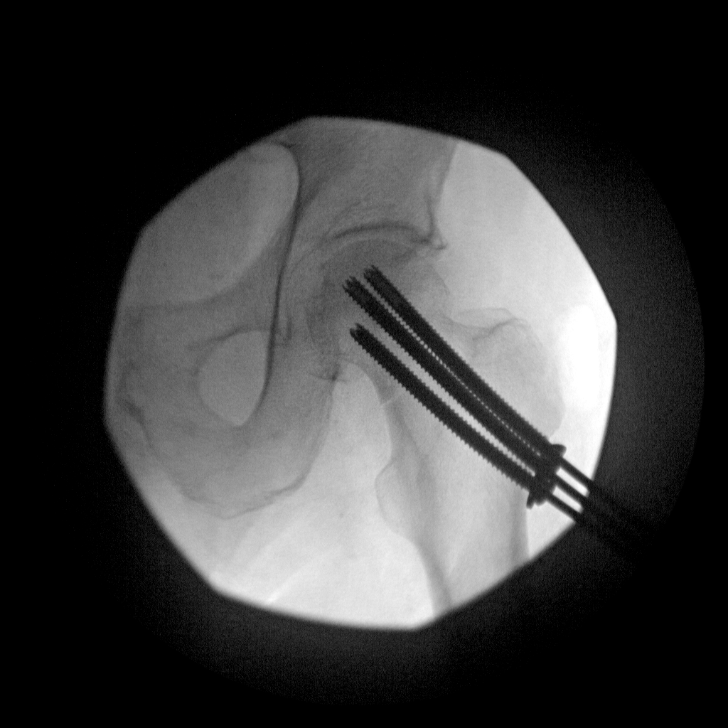
[im 6/8]
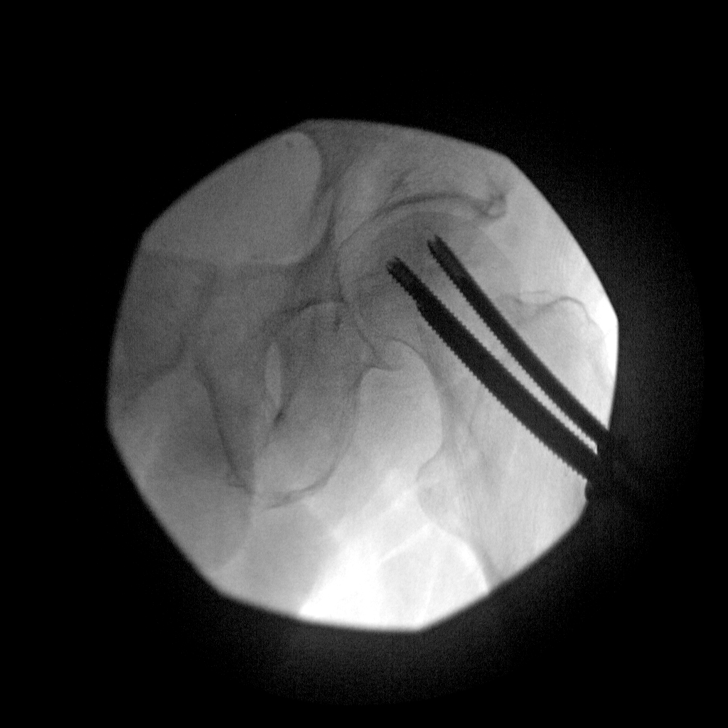
[im 7/8]
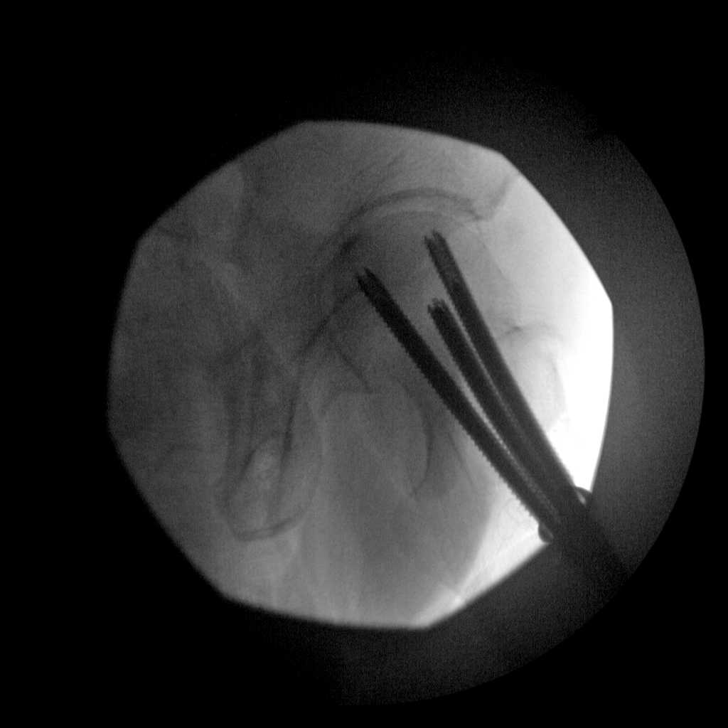
[im 8/8]
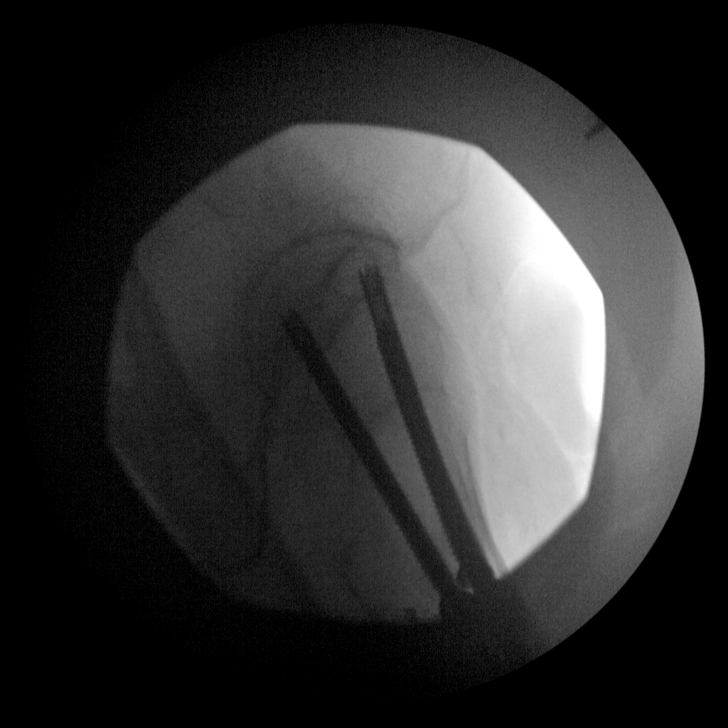

[8 of 8 positions shown; findings below may reference images not displayed]

FLUOROSCOPY TIME:  Radiation Exposure Index (as provided by the
fluoroscopic device): Not available

If the device does not provide the exposure index:

Fluoroscopy Time:  1 minutes 17 seconds

Number of Acquired Images:  9
FINDINGS: Initial images again demonstrate the left femoral neck fracture.
Three fixation screws are noted traversing the femoral neck.
Fracture fragments are in near anatomic alignment.
IMPRESSION: ORIF of left femoral fracture.

## 2021-03-30 ENCOUNTER — Telehealth: Payer: Self-pay | Admitting: Cardiovascular Disease

## 2021-03-30 NOTE — Telephone Encounter (Signed)
Called patient, LVM to call back to discuss.  Left call back number.   

## 2021-03-30 NOTE — Telephone Encounter (Signed)
STAT if HR is under 50 or over 120 (normal HR is 60-100 beats per minute)  What is your heart rate? Running in the 50's  Do you have a log of your heart rate readings (document readings)? unsure  Do you have any other symptoms? fatigue   Patient sent message to scheduling regarding his heart rate running in the 50's and feeling fatigued. Requesting a sooner appointment with Dr. Flora Lipps. I have added him to the waitlist in case of any cancellation's.

## 2021-04-07 NOTE — Telephone Encounter (Signed)
Attempted to contact pt x 2. Left message to call back 

## 2021-04-08 NOTE — Telephone Encounter (Signed)
Thomas [name] is returning Alisha's call. Attempted to call triage due to our office trying to contact him for the past 2 weeks in regards to this, but did not receive an answer.

## 2021-04-08 NOTE — Telephone Encounter (Signed)
Pt reports HRs in 50s. Pt states he is asymptomatic and feels fine. Reports HRs usually in 60s. States he thinks he should be seen sooner than December.   Pt made aware I would forward to MD & RN to address, but not sure he needs sooner appt being that he is asymptomatic. Aware someone will follow up once advised...Marland KitchenMarland Kitchen

## 2021-04-09 NOTE — Telephone Encounter (Signed)
Called patient, scheduled appointment sooner with Dr.O'Neal-  Patient verbalized understanding.

## 2021-04-09 NOTE — Telephone Encounter (Signed)
Called patient to get a sooner appointment. LVM to call back, left call back number.

## 2021-04-09 NOTE — Telephone Encounter (Signed)
Pt is returning a call  

## 2021-05-11 ENCOUNTER — Ambulatory Visit: Payer: Medicare Other | Admitting: Vascular Surgery

## 2021-05-21 ENCOUNTER — Ambulatory Visit: Payer: Medicare Other | Admitting: Cardiovascular Disease

## 2021-06-17 ENCOUNTER — Other Ambulatory Visit: Payer: Self-pay

## 2021-06-17 ENCOUNTER — Encounter: Payer: Self-pay | Admitting: Urology

## 2021-06-17 ENCOUNTER — Ambulatory Visit: Payer: Medicare Other | Admitting: Urology

## 2021-06-17 VITALS — BP 176/65 | HR 61

## 2021-06-17 DIAGNOSIS — N138 Other obstructive and reflux uropathy: Secondary | ICD-10-CM | POA: Diagnosis not present

## 2021-06-17 DIAGNOSIS — R3911 Hesitancy of micturition: Secondary | ICD-10-CM | POA: Diagnosis not present

## 2021-06-17 DIAGNOSIS — N401 Enlarged prostate with lower urinary tract symptoms: Secondary | ICD-10-CM | POA: Diagnosis not present

## 2021-06-17 DIAGNOSIS — N5201 Erectile dysfunction due to arterial insufficiency: Secondary | ICD-10-CM | POA: Diagnosis not present

## 2021-06-17 LAB — MICROSCOPIC EXAMINATION
Bacteria, UA: NONE SEEN
Epithelial Cells (non renal): NONE SEEN /hpf (ref 0–10)
RBC, Urine: NONE SEEN /hpf (ref 0–2)
Renal Epithel, UA: NONE SEEN /hpf
WBC, UA: NONE SEEN /hpf (ref 0–5)

## 2021-06-17 LAB — URINALYSIS, ROUTINE W REFLEX MICROSCOPIC
Bilirubin, UA: NEGATIVE
Glucose, UA: NEGATIVE
Ketones, UA: NEGATIVE
Leukocytes,UA: NEGATIVE
Nitrite, UA: NEGATIVE
Protein,UA: NEGATIVE
Specific Gravity, UA: 1.01 (ref 1.005–1.030)
Urobilinogen, Ur: 0.2 mg/dL (ref 0.2–1.0)
pH, UA: 7 (ref 5.0–7.5)

## 2021-06-17 NOTE — Progress Notes (Signed)
06/17/2021 1:50 PM   Thomas [name] 1941-03-11 [id number]  Referring provider: Kirby Funk, MD [address] [place],  [place] [address]  Followup BPh and erectile dysfunction   HPI: Thomas [name] is a 80yo here for followup for BPH and erectile dysfunction. IPSS 9 QOl 1. He has morning frequency and straining to urinate in the morning. Urine stream strengthens throughout the day. Nocturia 0x. No feeling f incomplete emptying. He has issues getting and maintaining an erection. He has failed viagra and cialis. His wife is currently in poor health.    PMH: Past Medical History:  Diagnosis Date   AAA (abdominal aortic aneurysm) (HCC)    4.2 cm by CTA 10/01/13- states "much smaller than first expected"Dr. Valentina Lucks follows-checks every 2 yrs   Allergic asthma    Allergic rhinitis    Anxiety depression   Arthritis    torn rotator cuff left shoulder   Bruise    left chest area"recent fall"-no rib fracture identified.   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   Colitis    GERD (gastroesophageal reflux disease)    History of measles    History of mumps    HLD (hyperlipidemia)    HTN (hypertension)    Obesity    Polio    left leg   Pupil irregularity, right    old injury"pupil will not contract to light"   Shortness of breath    exertion, asthma attack    Surgical History: Past Surgical History:  Procedure Laterality Date   CARDIAC CATHETERIZATION     2006- 1 week prior tp [facility]   CATARACT EXTRACTION     COLONOSCOPY WITH PROPOFOL N/A 04/27/2016   Procedure: COLONOSCOPY WITH PROPOFOL;  Surgeon: Charolett Bumpers, MD;  Location: [facility];  Service: Endoscopy;  Laterality: N/A;   CORONARY ARTERY BYPASS GRAFT  3/06   x4 vessel bypass [facility]-No problems since.   EYE SURGERY     rebuild eye socket (R)   HIP PINNING,CANNULATED Left 10/01/2019   HIP PINNING,CANNULATED Left 10/01/2019   Procedure: CANNULATED HIP PINNING;  Surgeon: Roby Lofts, MD;   Location: [facility] OR;  Service: Orthopedics;  Laterality: Left;   KNEE ARTHROSCOPY Left    left knee scope   LUMBAR DISC SURGERY  2011   LUMBAR LAMINECTOMY/DECOMPRESSION MICRODISCECTOMY Left 11/07/2013   Procedure: LUMBAR LAMINECTOMY/DECOMPRESSION MICRODISCECTOMY 1 LEVEL lumbar two/three;  Surgeon: Tia Alert, MD;  Location: [facility];  Service: Neurosurgery;  Laterality: Left;   NASAL SINUS SURGERY     SHOULDER SURGERY      Home Medications:  Allergies as of 06/17/2021       Reactions   Aspirin Hives   Codeine Hives   Fluticasone-salmeterol Hives   Montelukast Sodium Hives   Adhesive [tape] Rash   Cephalexin Rash   Ibuprofen Rash   Latex Rash   Statins Rash   Sulfonamide Derivatives Rash        Medication List        Accurate as of June 17, 2021  1:50 PM. If you have any questions, ask your nurse or doctor.          aspirin EC 81 MG tablet Take 81 mg by mouth daily.   Dulera 200-5 MCG/ACT Aero Generic drug: mometasone-formoterol Inhale 2 puffs into the lungs 2 (two) times daily.   escitalopram 10 MG tablet Commonly known as: LEXAPRO Take 10 mg by mouth daily.   ezetimibe 10 MG tablet  Commonly known as: ZETIA Take 1 tablet (10 mg total) by mouth daily.   fexofenadine 180 MG tablet Commonly known as: ALLEGRA Take 180 mg by mouth daily as needed for allergies.   lisinopril 10 MG tablet Commonly known as: ZESTRIL Take 10 mg by mouth daily.   LORazepam 1 MG tablet Commonly known as: ATIVAN Take 1 mg by mouth at bedtime as needed.   predniSONE 5 MG tablet Commonly known as: DELTASONE Take 5 mg by mouth daily.   ProAir HFA 108 (90 Base) MCG/ACT inhaler Generic drug: albuterol Inhale 2 puffs into the lungs every 4 (four) hours as needed for wheezing.   triamcinolone cream 0.1 % Commonly known as: KENALOG Apply 1 application topically 2 (two) times daily.   Vitamin D-3 125 MCG (5000 UT) Tabs Take 5,000 Units by mouth daily.         Allergies:  Allergies  Allergen Reactions   Aspirin Hives   Codeine Hives   Fluticasone-Salmeterol Hives   Montelukast Sodium Hives   Adhesive [Tape] Rash   Cephalexin Rash   Ibuprofen Rash   Latex Rash   Statins Rash   Sulfonamide Derivatives Rash    Family History: Family History  Problem Relation Age of Onset   Alzheimer's disease Mother    Liver disease Sister    Lung cancer Brother     Social History:  reports that he quit smoking about 28 years ago. His smoking use included cigarettes. He has a 37.00 pack-year smoking history. He has never used smokeless tobacco. He reports that he does not currently use alcohol. He reports that he does not use drugs.  ROS: All other review of systems were reviewed and are negative except what is noted above in HPI  Physical Exam: BP (!) 176/65   Pulse 61   Constitutional:  Alert and oriented, No acute distress. HEENT: [place] AT, moist mucus membranes.  Trachea midline, no masses. Cardiovascular: No clubbing, cyanosis, or edema. Respiratory: Normal respiratory effort, no increased work of breathing. GI: Abdomen is soft, nontender, nondistended, no abdominal masses GU: No CVA tenderness.  Lymph: No cervical or inguinal lymphadenopathy. Skin: No rashes, bruises or suspicious lesions. Neurologic: Grossly intact, no focal deficits, moving all 4 extremities. Psychiatric: Normal mood and affect.  Laboratory Data: Lab Results  Component Value Date   WBC 6.9 10/03/2019   HGB 11.8 (L) 10/03/2019   HCT 34.7 (L) 10/03/2019   MCV 94.0 10/03/2019   PLT 174 10/03/2019    Lab Results  Component Value Date   CREATININE 0.99 10/03/2019    No results found for: PSA  No results found for: TESTOSTERONE  No results found for: HGBA1C  Urinalysis    Component Value Date/Time   COLORURINE YELLOW 03/01/2010 0757   APPEARANCEUR Clear 06/16/2020 1634   LABSPEC 1.020 03/01/2010 0757   PHURINE 5.0 03/01/2010 0757   GLUCOSEU Negative  06/16/2020 1634   HGBUR NEGATIVE 03/01/2010 0757   BILIRUBINUR Negative 06/16/2020 1634   KETONESUR NEGATIVE 03/01/2010 0757   PROTEINUR Negative 06/16/2020 1634   PROTEINUR NEGATIVE 03/01/2010 0757   UROBILINOGEN 0.2 03/01/2010 0757   NITRITE Negative 06/16/2020 1634   NITRITE NEGATIVE 03/01/2010 0757   LEUKOCYTESUR Negative 06/16/2020 1634    Lab Results  Component Value Date   LABMICR See below: 06/16/2020   WBCUA None seen 06/16/2020   LABEPIT None seen 06/16/2020   BACTERIA None seen 06/16/2020    Pertinent Imaging:  No results found for this or any previous visit.  No results found for this or any previous visit.  No results found for this or any previous visit.  No results found for this or any previous visit.  No results found for this or any previous visit.  No results found for this or any previous visit.  No results found for this or any previous visit.  No results found for this or any previous visit.   Assessment & Plan:    1. Erectile dysfunction due to arterial insufficiency -patient defers therapy at this time  2. Benign prostatic hyperplasia with urinary obstruction -continue observation  3. Urinary hesitancy --continue observation - Urinalysis, Routine w reflex microscopic   No follow-ups on file.  Wilkie Aye, MD  [facility] [place]

## 2021-06-17 NOTE — Progress Notes (Signed)
Urological Symptom Review  Patient is experiencing the following symptoms: Trouble starting stream   Review of Systems  Gastrointestinal (upper)  : Negative for upper GI symptoms  Gastrointestinal (lower) : Negative for lower GI symptoms  Constitutional : Negative for symptoms  Skin: Negative for skin symptoms  Eyes: Negative for eye symptoms  Ear/Nose/Throat : Negative for Ear/Nose/Throat symptoms  Hematologic/Lymphatic: Negative for Hematologic/Lymphatic symptoms  Cardiovascular : Negative for cardiovascular symptoms  Respiratory : Negative for respiratory symptoms  Endocrine: Negative for endocrine symptoms  Musculoskeletal: Negative for musculoskeletal symptoms  Neurological: Negative for neurological symptoms  Psychologic: Negative for psychiatric symptoms  

## 2021-06-17 NOTE — Patient Instructions (Signed)
Benign Prostatic Hyperplasia Benign prostatic hyperplasia (BPH) is an enlarged prostate gland that is caused by the normal aging process and not by cancer. The prostate is a walnut-sized gland that is involved in the production of semen. It is located in front of the rectum and below the bladder. The bladder stores urine and the urethra is the tube that carries the urine out of the body. The prostate may get bigger as a man gets older. An enlarged prostate can press on the urethra. This can make it harder to pass urine. The build-up of urine in the bladder can cause infection. Back pressure and infection may progress to bladder damage and kidney (renal) failure. What are the causes? This condition is part of a normal aging process. However, not all men develop problems from this condition. If the prostate enlarges away from the urethra, urine flow will not be blocked. If it enlarges toward the urethra and compresses it, there will be problems passing urine. What increases the risk? This condition is more likely to develop in men over the age of 50 years. What are the signs or symptoms? Symptoms of this condition include: Getting up often during the night to urinate. Needing to urinate frequently during the day. Difficulty starting urine flow. Decrease in size and strength of your urine stream. Leaking (dribbling) after urinating. Inability to pass urine. This needs immediate treatment. Inability to completely empty your bladder. Pain when you pass urine. This is more common if there is also an infection. Urinary tract infection (UTI). How is this diagnosed? This condition is diagnosed based on your medical history, a physical exam, and your symptoms. Tests will also be done, such as: A post-void bladder scan. This measures any amount of urine that may remain in your bladder after you finish urinating. A digital rectal exam. In a rectal exam, your health care provider checks your prostate by  putting a lubricated, gloved finger into your rectum to feel the back of your prostate gland. This exam detects the size of your gland and any abnormal lumps or growths. An exam of your urine (urinalysis). A prostate specific antigen (PSA) screening. This is a blood test used to screen for prostate cancer. An ultrasound. This test uses sound waves to electronically produce a picture of your prostate gland. Your health care provider may refer you to a specialist in kidney and prostate diseases (urologist). How is this treated? Once symptoms begin, your health care provider will monitor your condition (active surveillance or watchful waiting). Treatment for this condition will depend on the severity of your condition. Treatment may include: Observation and yearly exams. This may be the only treatment needed if your condition and symptoms are mild. Medicines to relieve your symptoms, including: Medicines to shrink the prostate. Medicines to relax the muscle of the prostate. Surgery in severe cases. Surgery may include: Prostatectomy. In this procedure, the prostate tissue is removed completely through an open incision or with a laparoscope or robotics. Transurethral resection of the prostate (TURP). In this procedure, a tool is inserted through the opening at the tip of the penis (urethra). It is used to cut away tissue of the inner core of the prostate. The pieces are removed through the same opening of the penis. This removes the blockage. Transurethral incision (TUIP). In this procedure, small cuts are made in the prostate. This lessens the prostate's pressure on the urethra. Transurethral microwave thermotherapy (TUMT). This procedure uses microwaves to create heat. The heat destroys and removes a   small amount of prostate tissue. Transurethral needle ablation (TUNA). This procedure uses radio frequencies to destroy and remove a small amount of prostate tissue. Interstitial laser coagulation (ILC).  This procedure uses a laser to destroy and remove a small amount of prostate tissue. Transurethral electrovaporization (TUVP). This procedure uses electrodes to destroy and remove a small amount of prostate tissue. Prostatic urethral lift. This procedure inserts an implant to push the lobes of the prostate away from the urethra. Follow these instructions at home: Take over-the-counter and prescription medicines only as told by your health care provider. Monitor your symptoms for any changes. Contact your health care provider with any changes. Avoid drinking large amounts of liquid before going to bed or out in public. Avoid or reduce how much caffeine or alcohol you drink. Give yourself time when you urinate. Keep all follow-up visits as told by your health care provider. This is important. Contact a health care provider if: You have unexplained back pain. Your symptoms do not get better with treatment. You develop side effects from the medicine you are taking. Your urine becomes very dark or has a bad smell. Your lower abdomen becomes distended and you have trouble passing your urine. Get help right away if: You have a fever or chills. You suddenly cannot urinate. You feel lightheaded, or very dizzy, or you faint. There are large amounts of blood or clots in the urine. Your urinary problems become hard to manage. You develop moderate to severe low back or flank pain. The flank is the side of your body between the ribs and the hip. These symptoms may represent a serious problem that is an emergency. Do not wait to see if the symptoms will go away. Get medical help right away. Call your local emergency services (911 in the [place] Do not drive yourself to the hospital. Summary Benign prostatic hyperplasia (BPH) is an enlarged prostate that is caused by the normal aging process and not by cancer. An enlarged prostate can press on the urethra. This can make it hard to pass urine. This  condition is part of a normal aging process and is more likely to develop in men over the age of 50 years. Get help right away if you suddenly cannot urinate. This information is not intended to replace advice given to you by your health care provider. Make sure you discuss any questions you have with your health care provider. Document Revised: 11/19/2020 Document Reviewed: 04/17/2020 Elsevier Patient Education  2022 [employer]  

## 2021-07-13 ENCOUNTER — Other Ambulatory Visit: Payer: Self-pay | Admitting: Cardiovascular Disease

## 2021-08-20 ENCOUNTER — Other Ambulatory Visit: Payer: Self-pay

## 2021-08-20 ENCOUNTER — Ambulatory Visit: Payer: Medicare Other | Admitting: Cardiovascular Disease

## 2021-08-20 ENCOUNTER — Encounter: Payer: Self-pay | Admitting: Cardiovascular Disease

## 2021-08-20 VITALS — BP 166/72 | HR 51 | Ht 68.0 in | Wt 177.4 lb

## 2021-08-20 DIAGNOSIS — I25810 Atherosclerosis of coronary artery bypass graft(s) without angina pectoris: Secondary | ICD-10-CM | POA: Diagnosis not present

## 2021-08-20 DIAGNOSIS — E782 Mixed hyperlipidemia: Secondary | ICD-10-CM | POA: Diagnosis not present

## 2021-08-20 DIAGNOSIS — I10 Essential (primary) hypertension: Secondary | ICD-10-CM

## 2021-08-20 DIAGNOSIS — I7143 Infrarenal abdominal aortic aneurysm, without rupture: Secondary | ICD-10-CM

## 2021-08-20 NOTE — Patient Instructions (Signed)
Medication Instructions:  The current medical regimen is effective;  continue present plan and medications.  *If you need a refill on your cardiac medications before your next appointment, please call your pharmacy*  Follow-Up: At [facility], you and your health needs are our priority.  As part of our continuing mission to provide you with exceptional heart care, we have created designated Provider Care Teams.  These Care Teams include your primary Cardiologist (physician) and Advanced Practice Providers (APPs -  Physician Assistants and Nurse Practitioners) who all work together to provide you with the care you need, when you need it.  We recommend signing up for the patient portal called "MyChart".  Sign up information is provided on this After Visit Summary.  MyChart is used to connect with patients for Virtual Visits (Telemedicine).  Patients are able to view lab/test results, encounter notes, upcoming appointments, etc.  Non-urgent messages can be sent to your provider as well.   To learn more about what you can do with MyChart, go to [email or web address].    Your next appointment:   12 month(s)  The format for your next appointment:   In Person  Provider:   Reatha Harps, MD     Other Instructions Make sure to follow up with Dr.Early.

## 2021-08-20 NOTE — Progress Notes (Signed)
Cardiology Office Note:   Date:  08/20/2021  NAME:  Thomas [name]    MRN: [id number] DOB:  09/27/1940   PCP:  Lavone Orn, MD  Cardiologist:  Evalina Field, MD  Electrophysiologist:  None   Referring MD: Lavone Orn, MD   Chief Complaint  Patient presents with   Follow-up        History of Present Illness:   Thomas [name] is a 80 y.o. male with a hx of CAD s/p CABG, AAA, HTN, HLD who presents for follow-up.  Blood pressure slightly elevated today 166/72.  He has not taken his medications yet.  He reports no dizziness or lightheadedness.  No chest pain or trouble breathing.  He is walking with a walker.  He does exercise in the house.  No recent falls.  He reports his blood pressure is controlled at home.  His most recent LDL cholesterol 63.  This is at goal given his history of CAD as well as PAD.  He did have a ultrasound of his abdomen last year that showed a stable abdominal aortic aneurysm at 48 mm.  He is due for repeat evaluation by vascular surgery.  I encouraged him to do so.  Cardiovascular examination unremarkable.  Overall doing well.  Problem List 1. AAA -4.8 cm 04/2020 2. CAD -CABG x 4 2006 ([place]) -LIMA-LAD, SVG-RI, SVG-OM, SVG-PDA 3. HTN 4. HLD -T chol 125, HDL 44, LDL 63, TG 101 -statin intolerant  5. Prior tobacco abuse   Past Medical History: Past Medical History:  Diagnosis Date   AAA (abdominal aortic aneurysm)    4.2 cm by CTA 10/01/13- states "much smaller than first expected"Dr. Laurann Montana follows-checks every 2 yrs   Allergic asthma    Allergic rhinitis    Anxiety depression   Arthritis    torn rotator cuff left shoulder   Bruise    left chest area"recent fall"-no rib fracture identified.   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   Colitis    GERD (gastroesophageal reflux disease)    History of measles    History of mumps    HLD (hyperlipidemia)    HTN (hypertension)    Obesity    Polio    left leg   Pupil  irregularity, right    old injury"pupil will not contract to light"   Shortness of breath    exertion, asthma attack    Past Surgical History: Past Surgical History:  Procedure Laterality Date   CARDIAC CATHETERIZATION     2006- 1 week prior tp CABPG   CATARACT EXTRACTION     COLONOSCOPY WITH PROPOFOL N/A 04/27/2016   Procedure: COLONOSCOPY WITH PROPOFOL;  Surgeon: Garlan Fair, MD;  Location: [facility];  Service: Endoscopy;  Laterality: N/A;   CORONARY ARTERY BYPASS GRAFT  3/06   x4 vessel bypass [facility]-No problems since.   EYE SURGERY     rebuild eye socket (R)   HIP PINNING,CANNULATED Left 10/01/2019   HIP PINNING,CANNULATED Left 10/01/2019   Procedure: CANNULATED HIP PINNING;  Surgeon: Shona Needles, MD;  Location: [facility];  Service: Orthopedics;  Laterality: Left;   KNEE ARTHROSCOPY Left    left knee scope   LUMBAR DISC SURGERY  2011   LUMBAR LAMINECTOMY/DECOMPRESSION MICRODISCECTOMY Left 11/07/2013   Procedure: LUMBAR LAMINECTOMY/DECOMPRESSION MICRODISCECTOMY 1 LEVEL lumbar two/three;  Surgeon: Eustace Moore, MD;  Location: [facility];  Service: Neurosurgery;  Laterality: Left;   NASAL SINUS SURGERY  SHOULDER SURGERY      Current Medications: Current Meds  Medication Sig   aspirin EC 81 MG tablet Take 81 mg by mouth daily.   Cholecalciferol (VITAMIN D-3) 125 MCG (5000 UT) TABS Take 5,000 Units by mouth daily.   DULERA 200-5 MCG/ACT AERO Inhale 2 puffs into the lungs 2 (two) times daily.   escitalopram (LEXAPRO) 10 MG tablet Take 10 mg by mouth daily.   ezetimibe (ZETIA) 10 MG tablet TAKE 1 TABLET BY MOUTH EVERY DAY   fexofenadine (ALLEGRA) 180 MG tablet Take 180 mg by mouth daily as needed for allergies.    lisinopril (PRINIVIL,ZESTRIL) 10 MG tablet Take 10 mg by mouth daily.    LORazepam (ATIVAN) 1 MG tablet Take 1 mg by mouth at bedtime as needed.   PROAIR HFA 108 (90 Base) MCG/ACT inhaler Inhale 2 puffs into the lungs every 4 (four) hours as needed for wheezing.     triamcinolone cream (KENALOG) 0.1 % Apply 1 application topically 2 (two) times daily.     Allergies:    Aspirin, Codeine, Fluticasone-salmeterol, Montelukast sodium, Adhesive [tape], Cephalexin, Ibuprofen, Latex, Statins, and Sulfonamide derivatives   Social History: Social History   Socioeconomic History   Marital status: Married    Spouse name: Not on file   Number of children: 0   Years of education: Not on file   Highest education level: Not on file  Occupational History   Occupation: shuttle driver    Employer: [employer]    Comment: Engineer, manufacturing systems  Tobacco Use   Smoking status: Former    Packs/day: 1.00    Years: 37.00    Pack years: 37.00    Types: Cigarettes    Quit date: 08/23/1992    Years since quitting: 29.0   Smokeless tobacco: Never  Vaping Use   Vaping Use: Never used  Substance and Sexual Activity   Alcohol use: Not Currently    Comment: socially    Drug use: No   Sexual activity: Not on file  Other Topics Concern   Not on file  Social History Narrative   Not on file   Social Determinants of Health   Financial Resource Strain: Not on file  Food Insecurity: Not on file  Transportation Needs: Not on file  Physical Activity: Not on file  Stress: Not on file  Social Connections: Not on file    Family History: The patient's family history includes Alzheimer's disease in his mother; Liver disease in his sister; Lung cancer in his brother.  ROS:   All other ROS reviewed and negative. Pertinent positives noted in the HPI.     EKGs/Labs/Other Studies Reviewed:   The following studies were personally reviewed by me today:  EKG:  EKG is ordered today.  The ekg ordered today demonstrates sinus bradycardia heart rate 51, first-degree AV block, and was personally reviewed by me.   Vasc US 05/06/2020 Abdominal Aorta: There is evidence of abnormal dilatation of the distal  Abdominal aorta. The largest aortic measurement is 4.8 cm. Previous  diameter  measurement was 4.8 cm obtained on 10/18/2019.   Recent Labs: No results found for requested labs within last 8760 hours.   Recent Lipid Panel    Component Value Date/Time   CHOL 170 03/05/2011 0835   TRIG 89.0 03/05/2011 0835   HDL 48.20 03/05/2011 0835   CHOLHDL 4 03/05/2011 0835   VLDL 17.8 03/05/2011 0835   LDLCALC 104 (H) 03/05/2011 0835    Physical Exam:   VS:  BP (!) 166/72    Pulse (!) 51    Ht 5\' 8"  (1.727 m)    Wt 177 lb 6.4 oz (80.5 kg)    SpO2 98%    BMI 26.97 kg/m    Wt Readings from Last 3 Encounters:  08/20/21 177 lb 6.4 oz (80.5 kg)  07/07/20 181 lb (82.1 kg)  06/16/20 182 lb (82.6 kg)    General: Well nourished, well developed, in no acute distress Head: Atraumatic, normal size  Eyes: PEERLA, EOMI  Neck: Supple, no JVD Endocrine: No thryomegaly Cardiac: Normal S1, S2; RRR; no murmurs, rubs, or gallops Lungs: Clear to auscultation bilaterally, no wheezing, rhonchi or rales  Abd: Soft, nontender, no hepatomegaly  Ext: No edema, pulses 2+ Musculoskeletal: No deformities, BUE and BLE strength normal and equal Skin: Warm and dry, no rashes   Neuro: Alert and oriented to person, place, time, and situation, CNII-XII grossly intact, no focal deficits  Psych: Normal mood and affect   ASSESSMENT:   Thomas [name] is a 80 y.o. male who presents for the following: 1. Infrarenal abdominal aortic aneurysm (AAA) without rupture   2. Coronary artery disease involving coronary bypass graft of native heart without angina pectoris   3. Mixed hyperlipidemia   4. Essential hypertension     PLAN:   1. Infrarenal abdominal aortic aneurysm (AAA) without rupture -48 mm.  Stable on ultrasound last year.  Followed by vascular surgery.  He is due for repeat study this year.  He will reach out to vascular surgery.  2. Coronary artery disease involving coronary bypass graft of native heart without angina pectoris 3. Mixed hyperlipidemia -CAD status post CABG in the past.  No  symptoms of angina.  He is on aspirin 81 mg daily.  He is on Zetia 10 mg daily due to statin intolerance.  Most recent LDL is 63 which is at goal. -BP elevated today but he has not taken his medication.  Encouraged to do so.  BP values are stable at home.  4. Essential hypertension -Continue home lisinopril.  He did not take his blood pressure medicines today.  Disposition: Return in about 1 year (around 08/20/2022).  Medication Adjustments/Labs and Tests Ordered: Current medicines are reviewed at length with the patient today.  Concerns regarding medicines are outlined above.  Orders Placed This Encounter  Procedures   EKG 12-Lead   No orders of the defined types were placed in this encounter.   Patient Instructions  Medication Instructions:  The current medical regimen is effective;  continue present plan and medications.  *If you need a refill on your cardiac medications before your next appointment, please call your pharmacy*  Follow-Up: At [facility], you and your health needs are our priority.  As part of our continuing mission to provide you with exceptional heart care, we have created designated Provider Care Teams.  These Care Teams include your primary Cardiologist (physician) and Advanced Practice Providers (APPs -  Physician Assistants and Nurse Practitioners) who all work together to provide you with the care you need, when you need it.  We recommend signing up for the patient portal called "MyChart".  Sign up information is provided on this After Visit Summary.  MyChart is used to connect with patients for Virtual Visits (Telemedicine).  Patients are able to view lab/test results, encounter notes, upcoming appointments, etc.  Non-urgent messages can be sent to your provider as well.   To learn more about what you can do with MyChart, go  to [email or web address].    Your next appointment:   12 month(s)  The format for your next appointment:   In Person  Provider:    Reatha Harps, MD     Other Instructions Make sure to follow up with Dr.Early.      Time Spent with Patient: I have spent a total of 25 minutes with patient reviewing hospital notes, telemetry, EKGs, labs and examining the patient as well as establishing an assessment and plan that was discussed with the patient.  > 50% of time was spent in direct patient care.  Signed, Lenna Gilford. Flora Lipps, MD, [facility]   [facility]  [address] [place], [place] [address] [phone]  08/20/2021 10:13 AM

## 2021-08-21 NOTE — Telephone Encounter (Signed)
Patient would like another surgeon unable to contact office

## 2021-08-25 ENCOUNTER — Encounter: Payer: Self-pay | Admitting: Cardiovascular Disease

## 2021-10-09 ENCOUNTER — Other Ambulatory Visit: Payer: Self-pay | Admitting: Vascular Surgery

## 2021-10-09 ENCOUNTER — Other Ambulatory Visit (HOSPITAL_COMMUNITY): Payer: Self-pay | Admitting: Vascular Surgery

## 2021-10-09 DIAGNOSIS — I714 Abdominal aortic aneurysm, without rupture, unspecified: Secondary | ICD-10-CM

## 2021-10-14 ENCOUNTER — Encounter: Payer: Self-pay | Admitting: Vascular Surgery

## 2021-10-14 ENCOUNTER — Other Ambulatory Visit: Payer: Self-pay

## 2021-10-14 ENCOUNTER — Ambulatory Visit (INDEPENDENT_AMBULATORY_CARE_PROVIDER_SITE_OTHER): Payer: Medicare Other

## 2021-10-14 ENCOUNTER — Ambulatory Visit (INDEPENDENT_AMBULATORY_CARE_PROVIDER_SITE_OTHER): Payer: Medicare Other | Admitting: Vascular Surgery

## 2021-10-14 VITALS — BP 170/72 | HR 53 | Temp 98.2°F | Resp 18 | Ht 68.0 in | Wt 178.0 lb

## 2021-10-14 DIAGNOSIS — I714 Abdominal aortic aneurysm, without rupture, unspecified: Secondary | ICD-10-CM | POA: Diagnosis not present

## 2021-10-14 DIAGNOSIS — I7143 Infrarenal abdominal aortic aneurysm, without rupture: Secondary | ICD-10-CM | POA: Diagnosis not present

## 2021-10-14 NOTE — Progress Notes (Signed)
[facility] [place]  Patient name: Thomas [name] MRN: [id number] DOB: October 22, 1940 Sex: male  REASON FOR VISIT: Follow-up known abdominal aortic aneurysm  HPI: Thomas [name] is a 81 y.o. male here today for follow-up.  He reports no new cardiac difficulties and no major health issues.  He is affected by seasonal allergies.  He has no symptoms referable to his aneurysm.  Past Medical History:  Diagnosis Date   AAA (abdominal aortic aneurysm)    4.2 cm by CTA 10/01/13- states "much smaller than first expected"Dr. Laurann Montana follows-checks every 2 yrs   Allergic asthma    Allergic rhinitis    Anxiety depression   Arthritis    torn rotator cuff left shoulder   Bruise    left chest area"recent fall"-no rib fracture identified.   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   Colitis    GERD (gastroesophageal reflux disease)    History of measles    History of mumps    HLD (hyperlipidemia)    HTN (hypertension)    Obesity    Polio    left leg   Pupil irregularity, right    old injury"pupil will not contract to light"   Shortness of breath    exertion, asthma attack    Family History  Problem Relation Age of Onset   Alzheimer's disease Mother    Liver disease Sister    Lung cancer Brother     SOCIAL HISTORY: Social History   Tobacco Use   Smoking status: Former    Packs/day: 1.00    Years: 37.00    Pack years: 37.00    Types: Cigarettes    Quit date: 08/23/1992    Years since quitting: 29.1   Smokeless tobacco: Never  Substance Use Topics   Alcohol use: Not Currently    Comment: socially     Allergies  Allergen Reactions   Aspirin Hives   Codeine Hives   Fluticasone-Salmeterol Hives   Montelukast Sodium Hives   Adhesive [Tape] Rash   Cephalexin Rash   Ibuprofen Rash   Latex Rash   Statins Rash   Sulfonamide Derivatives Rash    Current Outpatient Medications  Medication Sig Dispense  Refill   aspirin EC 81 MG tablet Take 81 mg by mouth daily.     Cholecalciferol (VITAMIN D-3) 125 MCG (5000 UT) TABS Take 5,000 Units by mouth daily. 90 tablet 1   DULERA 200-5 MCG/ACT AERO Inhale 2 puffs into the lungs 2 (two) times daily.     escitalopram (LEXAPRO) 10 MG tablet Take 10 mg by mouth daily.     ezetimibe (ZETIA) 10 MG tablet TAKE 1 TABLET BY MOUTH EVERY DAY 90 tablet 3   fexofenadine (ALLEGRA) 180 MG tablet Take 180 mg by mouth daily as needed for allergies.      lisinopril (PRINIVIL,ZESTRIL) 10 MG tablet Take 10 mg by mouth daily.      LORazepam (ATIVAN) 1 MG tablet Take 1 mg by mouth at bedtime as needed.     predniSONE (DELTASONE) 5 MG tablet Take 5 mg by mouth daily. (Patient not taking: Reported on 08/20/2021)     PROAIR HFA 108 (90 Base) MCG/ACT inhaler Inhale 2 puffs into the lungs every 4 (four) hours as needed for wheezing.      triamcinolone cream (KENALOG) 0.1 % Apply 1 application topically 2 (two) times daily. 60 g 0   No current facility-administered medications for this visit.  REVIEW OF SYSTEMS:  [X]  denotes positive finding, [ ]  denotes negative finding Cardiac  Comments:  Chest pain or chest pressure:    Shortness of breath upon exertion:    Short of breath when lying flat:    Irregular heart rhythm:        Vascular    Pain in calf, thigh, or hip brought on by ambulation:    Pain in feet at night that wakes you up from your sleep:     Blood clot in your veins:    Leg swelling:           PHYSICAL EXAM: Vitals:   10/14/21 1019  BP: (!) 170/72  Pulse: (!) 53  Resp: 18  Temp: 98.2 F (36.8 C)  TempSrc: Temporal  SpO2: 96%  Weight: 178 lb (80.7 kg)  Height: 5\' 8"  (1.727 m)    GENERAL: The patient is a well-nourished male, in no acute distress. The vital signs are documented above. CARDIOVASCULAR: Abdomen soft and nontender.  I do not palpate an aneurysm.  Carotid arteries without bruits.  2+ radial pulses bilaterally. PULMONARY: There is  good air exchange  MUSCULOSKELETAL: There are no major deformities or cyanosis. NEUROLOGIC: No focal weakness or paresthesias are detected. SKIN: There are no ulcers or rashes noted. PSYCHIATRIC: The patient has a normal affect.  DATA:  Carotid aortic ultrasound today reveals maximal diameter of 5.0 cm.  This compares to 4.8 cm in September 2021  MEDICAL ISSUES: I discussed these findings at length with the patient.  I again reviewed symptoms of leaking aneurysm and he knows to report immediately to the emergency department should this occur.  I have recommended that we see him again in 6 months for repeat ultrasound.  Explained that we would consider elective repair at 5.5 cm.    Rosetta Posner, MD FACS [facility] [phone]  Note: Portions of this report may have been transcribed using voice recognition software.  Every effort has been made to ensure accuracy; however, inadvertent computerized transcription errors may still be present.

## 2021-10-23 ENCOUNTER — Other Ambulatory Visit: Payer: Self-pay | Admitting: *Deleted

## 2021-10-23 DIAGNOSIS — I714 Abdominal aortic aneurysm, without rupture, unspecified: Secondary | ICD-10-CM

## 2021-11-04 DIAGNOSIS — I714 Abdominal aortic aneurysm, without rupture, unspecified: Secondary | ICD-10-CM | POA: Diagnosis not present

## 2021-11-04 DIAGNOSIS — R21 Rash and other nonspecific skin eruption: Secondary | ICD-10-CM | POA: Diagnosis not present

## 2021-11-04 DIAGNOSIS — J45998 Other asthma: Secondary | ICD-10-CM | POA: Diagnosis not present

## 2021-11-04 DIAGNOSIS — I10 Essential (primary) hypertension: Secondary | ICD-10-CM | POA: Diagnosis not present

## 2021-12-01 DIAGNOSIS — H905 Unspecified sensorineural hearing loss: Secondary | ICD-10-CM | POA: Diagnosis not present

## 2022-01-01 DIAGNOSIS — I2510 Atherosclerotic heart disease of native coronary artery without angina pectoris: Secondary | ICD-10-CM | POA: Diagnosis not present

## 2022-01-01 DIAGNOSIS — I10 Essential (primary) hypertension: Secondary | ICD-10-CM | POA: Diagnosis not present

## 2022-01-01 DIAGNOSIS — J45998 Other asthma: Secondary | ICD-10-CM | POA: Diagnosis not present

## 2022-01-01 DIAGNOSIS — E7800 Pure hypercholesterolemia, unspecified: Secondary | ICD-10-CM | POA: Diagnosis not present

## 2022-04-14 ENCOUNTER — Ambulatory Visit: Payer: Medicare Other | Admitting: Vascular Surgery

## 2022-04-14 ENCOUNTER — Ambulatory Visit (INDEPENDENT_AMBULATORY_CARE_PROVIDER_SITE_OTHER): Payer: Medicare Other

## 2022-04-14 ENCOUNTER — Encounter: Payer: Self-pay | Admitting: Vascular Surgery

## 2022-04-14 VITALS — BP 142/62 | HR 56 | Temp 98.2°F | Ht 68.0 in | Wt 176.0 lb

## 2022-04-14 DIAGNOSIS — I714 Abdominal aortic aneurysm, without rupture, unspecified: Secondary | ICD-10-CM

## 2022-04-14 NOTE — Progress Notes (Signed)
[facility] [place]  Patient name: Thomas [name] MRN: [id number] DOB: 1941-06-17 Sex: male  REASON FOR VISIT: Follow-up known infrarenal abdominal aortic aneurysm  HPI: RUHAN BORAK is a 81 y.o. male here today for follow-up.  Remains in stable health.  Some difficulty with walking related to polio as a child and walks with a walker for stability.  No symptoms referable to his aneurysm.  Past Medical History:  Diagnosis Date   AAA (abdominal aortic aneurysm) (HCC)    4.2 cm by CTA 10/01/13- states "much smaller than first expected"Dr. Valentina Lucks follows-checks every 2 yrs   Allergic asthma    Allergic rhinitis    Anxiety depression   Arthritis    torn rotator cuff left shoulder   Bruise    left chest area"recent fall"-no rib fracture identified.   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   Colitis    GERD (gastroesophageal reflux disease)    History of measles    History of mumps    HLD (hyperlipidemia)    HTN (hypertension)    Obesity    Polio    left leg   Pupil irregularity, right    old injury"pupil will not contract to light"   Shortness of breath    exertion, asthma attack    Family History  Problem Relation Age of Onset   Alzheimer's disease Mother    Liver disease Sister    Lung cancer Brother     SOCIAL HISTORY: Social History   Tobacco Use   Smoking status: Former    Packs/day: 1.00    Years: 37.00    Total pack years: 37.00    Types: Cigarettes    Quit date: 08/23/1992    Years since quitting: 29.6   Smokeless tobacco: Never  Substance Use Topics   Alcohol use: Not Currently    Comment: socially     Allergies  Allergen Reactions   Aspirin Hives   Codeine Hives   Fluticasone-Salmeterol Hives   Montelukast Sodium Hives   Adhesive [Tape] Rash   Cephalexin Rash   Ibuprofen Rash   Latex Rash   Statins Rash   Sulfonamide Derivatives Rash    Current Outpatient  Medications  Medication Sig Dispense Refill   aspirin EC 81 MG tablet Take 81 mg by mouth daily.     Cholecalciferol (VITAMIN D-3) 125 MCG (5000 UT) TABS Take 5,000 Units by mouth daily. 90 tablet 1   DULERA 200-5 MCG/ACT AERO Inhale 2 puffs into the lungs 2 (two) times daily.     escitalopram (LEXAPRO) 10 MG tablet Take 10 mg by mouth daily.     ezetimibe (ZETIA) 10 MG tablet TAKE 1 TABLET BY MOUTH EVERY DAY 90 tablet 3   fexofenadine (ALLEGRA) 180 MG tablet Take 180 mg by mouth daily as needed for allergies.      lisinopril (PRINIVIL,ZESTRIL) 10 MG tablet Take 10 mg by mouth daily.      LORazepam (ATIVAN) 1 MG tablet Take 1 mg by mouth at bedtime as needed.     predniSONE (DELTASONE) 5 MG tablet Take 5 mg by mouth daily.     PROAIR HFA 108 (90 Base) MCG/ACT inhaler Inhale 2 puffs into the lungs every 4 (four) hours as needed for wheezing.      triamcinolone cream (KENALOG) 0.1 % Apply 1 application topically 2 (two) times daily. 60 g 0   No current facility-administered medications for this visit.  REVIEW OF SYSTEMS:  [X]  denotes positive finding, [ ]  denotes negative finding Cardiac  Comments:  Chest pain or chest pressure:    Shortness of breath upon exertion: x   Short of breath when lying flat:    Irregular heart rhythm:        Vascular    Pain in calf, thigh, or hip brought on by ambulation:    Pain in feet at night that wakes you up from your sleep:     Blood clot in your veins:    Leg swelling:           PHYSICAL EXAM: Vitals:   04/14/22 0928  BP: (!) 142/62  Pulse: (!) 56  Temp: 98.2 F (36.8 C)  SpO2: 96%  Weight: 176 lb (79.8 kg)  Height: 5\' 8"  (1.727 m)    GENERAL: The patient is a well-nourished male, in no acute distress. The vital signs are documented above. CARDIOVASCULAR: Plus radial and 2+ femoral pulses bilaterally.  I do not palpate abdominal aortic aneurysm PULMONARY: There is good air exchange  MUSCULOSKELETAL: There are no major deformities or  cyanosis. NEUROLOGIC: No focal weakness or paresthesias are detected. SKIN: There are no ulcers or rashes noted. PSYCHIATRIC: The patient has a normal affect.  DATA:  Aortic ultrasound today reveals maximal diameter of 5.25 cm.  This is slightly up from 5.0 cm 6 months ago  MEDICAL ISSUES: Stable slight aneurysm growth by ultrasound.  I again discussed the indication for repair at approximately 5 and half centimeters.  I recommended that we see him again in 6 months and will obtain CT angiogram of abdomen and pelvis at that time for better size definition and also determine treatment options.    , MD FACS [facility] [phone]  Note: Portions of this report may have been transcribed using voice recognition software.  Every effort has been made to ensure accuracy; however, inadvertent computerized transcription errors may still be present.

## 2022-05-22 ENCOUNTER — Ambulatory Visit
Admission: EM | Admit: 2022-05-22 | Discharge: 2022-05-22 | Disposition: A | Discharge status: Home / Self Care | Payer: Medicare Other | Attending: Family Medicine | Admitting: Family Medicine

## 2022-05-22 ENCOUNTER — Encounter: Payer: Self-pay | Admitting: Emergency Medicine

## 2022-05-22 DIAGNOSIS — L03116 Cellulitis of left lower limb: Secondary | ICD-10-CM

## 2022-05-22 MED ORDER — DOXYCYCLINE HYCLATE 100 MG PO CAPS: 100.0000 mg | ORAL_CAPSULE | Freq: Two times a day (BID) | ORAL | 0 refills | Status: DC

## 2022-05-22 NOTE — Discharge Instructions (Signed)
Elevate your leg at rest, wear compression stockings that go up to the knee, soak in warm epsom salts. Take the antibiotics and follow up as scheduled with your Primary Care next week, sooner if worsening

## 2022-05-22 NOTE — ED Triage Notes (Signed)
Left lower leg and foot swelling x 2 days.  States he thinks something may have bit him.

## 2022-05-22 NOTE — ED Provider Notes (Signed)
[facility]    CSN: [id number] Arrival date & time: 05/22/22  0803      History   Chief Complaint No chief complaint on file.   HPI Thomas [name] is a 81 y.o. male.    Patient presenting today with left anterior and lateral lower leg swelling, redness, warmth for the past 2 days.  States it is becoming increasingly more painful.  He thinks he may have been bitten by something but did not see what it could have been.  He denies fever, chills, numbness, tingling, decreased range of motion, posterior leg pain, chest pain, shortness of breath, palpitations, dizziness.  So far not trying anything over-the-counter for symptoms.    Past Medical History:  Diagnosis Date   AAA (abdominal aortic aneurysm) ([place])    4.2 cm by CTA 10/01/13- states "much smaller than first expected"Dr. Laurann Montana follows-checks every 2 yrs   Allergic asthma    Allergic rhinitis    Anxiety depression   Arthritis    torn rotator cuff left shoulder   Bruise    left chest area"recent fall"-no rib fracture identified.   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   Colitis    GERD (gastroesophageal reflux disease)    History of measles    History of mumps    HLD (hyperlipidemia)    HTN (hypertension)    Obesity    Polio    left leg   Pupil irregularity, right    old injury"pupil will not contract to light"   Shortness of breath    exertion, asthma attack    Patient Active Problem List   Diagnosis Date Noted   Erectile dysfunction due to arterial insufficiency 06/16/2020   AAA (abdominal aortic aneurysm) without rupture ([place]) 10/18/2019   Closed left hip fracture, initial encounter ([place]) 10/01/2019   Hip fracture ([place]) 10/01/2019   Nocturnal leg cramps 09/27/2014   S/P lumbar microdiscectomy 11/07/2013   Lumbar disc disease 10/29/2013   CAD (coronary artery disease)    Obesity (BMI 30-39.9)    Pupil diameter unequal    Hyperlipidemia    Hypertensive heart disease    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   EYE SURGERY     rebuild eye socket (R)   HIP PINNING,CANNULATED Left 10/01/2019   HIP PINNING,CANNULATED Left 10/01/2019   Procedure: CANNULATED HIP PINNING;  Surgeon: Shona Needles, MD;  Location: [facility];  Service: Orthopedics;  Laterality: Left;   KNEE ARTHROSCOPY Left    left knee scope   LUMBAR DISC SURGERY  2011   LUMBAR LAMINECTOMY/DECOMPRESSION MICRODISCECTOMY Left 11/07/2013   Procedure: LUMBAR LAMINECTOMY/DECOMPRESSION MICRODISCECTOMY 1 LEVEL lumbar two/three;  Surgeon: Eustace Moore, MD;  Location: [facility];  Service: Neurosurgery;  Laterality: Left;   NASAL SINUS SURGERY     SHOULDER SURGERY         Home Medications    Prior to Admission medications   Medication Sig Start Date End Date Taking?  Authorizing Provider  doxycycline (VIBRAMYCIN) 100 MG capsule Take 1 capsule (100 mg total) by mouth 2 (two) times daily. 05/22/22  Yes Particia Nearing, PA-C  aspirin EC 81 MG tablet Take 81 mg by mouth daily.    [provider]  Cholecalciferol (VITAMIN D-3) 125 MCG (5000 UT) TABS Take 5,000 Units by mouth daily. 10/03/19   West Bali, PA-C  DULERA 200-5 MCG/ACT AERO Inhale 2 puffs into the lungs 2 (two) times daily. 05/29/19   [provider]  escitalopram (LEXAPRO) 10 MG tablet Take 10 mg by mouth daily. 11/19/19   [provider]  ezetimibe (ZETIA) 10 MG tablet TAKE 1 TABLET BY MOUTH EVERY DAY 07/13/21   O'Neal, Ronnald Ramp, MD  fexofenadine (ALLEGRA)  180 MG tablet Take 180 mg by mouth daily as needed for allergies.     [provider]  lisinopril (PRINIVIL,ZESTRIL) 10 MG tablet Take 10 mg by mouth daily.     [provider]  LORazepam (ATIVAN) 1 MG tablet Take 1 mg by mouth at bedtime as needed. 11/19/19   [provider]  predniSONE (DELTASONE) 5 MG tablet Take 5 mg by mouth daily. 06/15/21   [provider]  PROAIR HFA 108 (90 Base) MCG/ACT inhaler Inhale 2 puffs into the lungs every 4 (four) hours as needed for wheezing.  09/19/19   [provider]  triamcinolone cream (KENALOG) 0.1 % Apply 1 application topically 2 (two) times daily. 03/08/21   Bing Neighbors, FNP    Family History Family History  Problem Relation Age of Onset   Alzheimer's disease Mother    Liver disease Sister    Lung cancer Brother     Social History Social History   Tobacco Use   Smoking status: Former    Packs/day: 1.00    Years: 37.00    Total pack years: 37.00    Types: Cigarettes    Quit date: 08/23/1992    Years since quitting: 29.7   Smokeless tobacco: Never  Vaping Use   Vaping Use: Never used  Substance Use Topics   Alcohol use: Not Currently    Comment: socially    Drug use: No     Allergies   Aspirin, Codeine, Fluticasone-salmeterol, Montelukast sodium, Adhesive [tape], Cephalexin, Ibuprofen, Latex, Statins, and Sulfonamide derivatives   Review of Systems Review of Systems Per HPI  Physical Exam Triage Vital Signs ED Triage Vitals  Enc Vitals Group     BP 05/22/22 0811 135/71     Pulse Rate 05/22/22 0811 79     Resp 05/22/22 0811 18     Temp 05/22/22 0811 (!) 97.4 F (36.3 C)     Temp Source 05/22/22 0811 Oral     SpO2 05/22/22 0811 96 %     Weight --      Height --      Head Circumference --      Peak Flow --      Pain Score 05/22/22 0812 3     Pain Loc --      Pain Edu? --      Excl. in GC? --    No data found.  Updated Vital Signs BP 135/71 (BP Location: Right Arm)    Pulse 79   Temp (!) 97.4 F (36.3 C) (Oral)   Resp 18   SpO2 96%   Visual Acuity Right Eye Distance:   Left Eye Distance:   Bilateral Distance:    Right Eye Near:   Left  Eye Near:    Bilateral Near:     Physical Exam Vitals and nursing note reviewed.  Constitutional:      Appearance: Normal appearance.  HENT:     Head: Atraumatic.  Eyes:     Extraocular Movements: Extraocular movements intact.     Conjunctiva/sclera: Conjunctivae normal.  Cardiovascular:     Rate and Rhythm: Normal rate and regular rhythm.  Pulmonary:     Effort: Pulmonary effort is normal.     Breath sounds: Normal breath sounds.  Musculoskeletal:        General: Swelling and tenderness present. Normal range of motion.     Cervical back: Normal range of motion and neck supple.     Comments: Localized erythema, edema, warmth to left anterior lower leg near ankle, slightly extending toward the lateral edge Negative Homans' sign and squeeze test, no posterior leg pain or swelling  Skin:    General: Skin is warm and dry.     Findings: Erythema present.  Neurological:     Mental Status: He is oriented to person, place, and time.     Comments: Left lower extremity neurovascularly intact  Psychiatric:        Mood and Affect: Mood normal.        Thought Content: Thought content normal.        Judgment: Judgment normal.      UC Treatments / Results  Labs (all labs ordered are listed, but only abnormal results are displayed) Labs Reviewed - No data to display  EKG   Radiology No results found.  Procedures Procedures (including critical care time)  Medications Ordered in UC Medications - No data to display  Initial Impression / Assessment and Plan / UC Course  I have reviewed the triage vital signs and the nursing notes.  Pertinent labs & imaging results that were available during my care of the patient were reviewed by me and considered in my medical decision making (see chart for  details).     Do suspect some developing cellulitis, discussed compression stockings, leg elevation, Epsom salt soaks and a course of antibiotics to protect against this.  He states he has a primary care appointment scheduled for next week and will use this as a follow-up point but discussed to return sooner if worsening.  Very low suspicion for DVT at this time.  Final Clinical Impressions(s) / UC Diagnoses   Final diagnoses:  Cellulitis of left lower extremity     Discharge Instructions      Elevate your leg at rest, wear compression stockings that go up to the knee, soak in warm epsom salts. Take the antibiotics and follow up as scheduled with your Primary Care next week, sooner if worsening    ED Prescriptions     Medication Sig Dispense Auth. Provider   doxycycline (VIBRAMYCIN) 100 MG capsule Take 1 capsule (100 mg total) by mouth 2 (two) times daily. 14 capsule Particia Nearing, [place]      PDMP not reviewed this encounter.   Roosvelt Maser [place], [place] 05/22/22 [id number]

## 2022-05-27 DIAGNOSIS — J449 Chronic obstructive pulmonary disease, unspecified: Secondary | ICD-10-CM | POA: Diagnosis not present

## 2022-05-27 DIAGNOSIS — L03116 Cellulitis of left lower limb: Secondary | ICD-10-CM | POA: Diagnosis not present

## 2022-05-27 DIAGNOSIS — I10 Essential (primary) hypertension: Secondary | ICD-10-CM | POA: Diagnosis not present

## 2022-05-27 DIAGNOSIS — Z0000 Encounter for general adult medical examination without abnormal findings: Secondary | ICD-10-CM | POA: Diagnosis not present

## 2022-05-27 DIAGNOSIS — E7800 Pure hypercholesterolemia, unspecified: Secondary | ICD-10-CM | POA: Diagnosis not present

## 2022-05-27 DIAGNOSIS — J45998 Other asthma: Secondary | ICD-10-CM | POA: Diagnosis not present

## 2022-05-27 DIAGNOSIS — I2510 Atherosclerotic heart disease of native coronary artery without angina pectoris: Secondary | ICD-10-CM | POA: Diagnosis not present

## 2022-05-27 DIAGNOSIS — Z23 Encounter for immunization: Secondary | ICD-10-CM | POA: Diagnosis not present

## 2022-06-03 DIAGNOSIS — R600 Localized edema: Secondary | ICD-10-CM | POA: Diagnosis not present

## 2022-06-03 DIAGNOSIS — M79605 Pain in left leg: Secondary | ICD-10-CM | POA: Diagnosis not present

## 2022-06-04 DIAGNOSIS — M79662 Pain in left lower leg: Secondary | ICD-10-CM | POA: Diagnosis not present

## 2022-06-04 DIAGNOSIS — M79605 Pain in left leg: Secondary | ICD-10-CM | POA: Diagnosis not present

## 2022-06-15 ENCOUNTER — Ambulatory Visit
Admission: RE | Admit: 2022-06-15 | Discharge: 2022-06-15 | Disposition: A | Discharge status: Home / Self Care | Payer: Medicare Other | Source: Ambulatory Visit | Attending: Internal Medicine | Admitting: Internal Medicine

## 2022-06-15 ENCOUNTER — Other Ambulatory Visit: Payer: Self-pay | Admitting: Internal Medicine

## 2022-06-15 DIAGNOSIS — M79605 Pain in left leg: Secondary | ICD-10-CM

## 2022-06-15 DIAGNOSIS — M79662 Pain in left lower leg: Secondary | ICD-10-CM | POA: Diagnosis not present

## 2022-06-16 ENCOUNTER — Ambulatory Visit: Payer: Medicare Other | Admitting: Urology

## 2022-06-17 DIAGNOSIS — I10 Essential (primary) hypertension: Secondary | ICD-10-CM | POA: Diagnosis not present

## 2022-06-17 DIAGNOSIS — R600 Localized edema: Secondary | ICD-10-CM | POA: Diagnosis not present

## 2022-06-17 DIAGNOSIS — I872 Venous insufficiency (chronic) (peripheral): Secondary | ICD-10-CM | POA: Diagnosis not present

## 2022-06-30 ENCOUNTER — Ambulatory Visit: Payer: Medicare Other | Admitting: Urology

## 2022-06-30 DIAGNOSIS — N401 Enlarged prostate with lower urinary tract symptoms: Secondary | ICD-10-CM

## 2022-06-30 DIAGNOSIS — R3911 Hesitancy of micturition: Secondary | ICD-10-CM

## 2022-06-30 DIAGNOSIS — N5201 Erectile dysfunction due to arterial insufficiency: Secondary | ICD-10-CM

## 2022-07-22 DIAGNOSIS — I10 Essential (primary) hypertension: Secondary | ICD-10-CM | POA: Diagnosis not present

## 2022-07-22 DIAGNOSIS — I872 Venous insufficiency (chronic) (peripheral): Secondary | ICD-10-CM | POA: Diagnosis not present

## 2022-07-22 DIAGNOSIS — Z23 Encounter for immunization: Secondary | ICD-10-CM | POA: Diagnosis not present

## 2022-07-22 DIAGNOSIS — R600 Localized edema: Secondary | ICD-10-CM | POA: Diagnosis not present

## 2022-08-06 DIAGNOSIS — I10 Essential (primary) hypertension: Secondary | ICD-10-CM | POA: Diagnosis not present

## 2022-08-09 NOTE — Progress Notes (Unsigned)
Cardiology Clinic Note   Patient Name: DINO BORNTREGER Date of Encounter: 08/17/2022  Primary Care Provider:  Kirby Funk, MD Primary Cardiologist:  Reatha Harps, MD  Patient Profile    STUART MIRABILE 81 year old male presents to the clinic today for follow-up evaluation of his hypertension, coronary artery disease and AAA.  Past Medical History    Past Medical History:  Diagnosis Date   AAA (abdominal aortic aneurysm) ([facility])    4.2 cm by CTA 10/01/13- states "much smaller than first expected"Dr. Valentina Lucks follows-checks every 2 yrs   Allergic asthma    Allergic rhinitis    Anxiety depression   Arthritis    torn rotator cuff left shoulder   Bruise    left chest area"recent fall"-no rib fracture identified.   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   Colitis    GERD (gastroesophageal reflux disease)    History of measles    History of mumps    HLD (hyperlipidemia)    HTN (hypertension)    Obesity    Polio    left leg   Pupil irregularity, right    old injury"pupil will not contract to light"   Shortness of breath    exertion, asthma attack   Past Surgical History:  Procedure Laterality Date   CARDIAC CATHETERIZATION     2006- 1 week prior tp CABPG   CATARACT EXTRACTION     COLONOSCOPY WITH PROPOFOL N/A 04/27/2016   Procedure: COLONOSCOPY WITH PROPOFOL;  Surgeon: Charolett Bumpers, MD;  [facility];  Service: Endoscopy;  Laterality: N/A;   CORONARY ARTERY BYPASS GRAFT  3/06   x4 vessel bypass [facility]-No problems since.   EYE SURGERY     rebuild eye socket (R)   HIP PINNING,CANNULATED Left 10/01/2019   HIP PINNING,CANNULATED Left 10/01/2019   Procedure: CANNULATED HIP PINNING;  Surgeon: Roby Lofts, MD;  Location: [facility];  Service: Orthopedics;  Laterality: Left;   KNEE ARTHROSCOPY Left    left knee scope   LUMBAR DISC SURGERY  2011   LUMBAR LAMINECTOMY/DECOMPRESSION MICRODISCECTOMY Left 11/07/2013   Procedure: LUMBAR  LAMINECTOMY/DECOMPRESSION MICRODISCECTOMY 1 LEVEL lumbar two/three;  Surgeon: Tia Alert, MD;  Location: [facility];  Service: Neurosurgery;  Laterality: Left;   NASAL SINUS SURGERY     SHOULDER SURGERY      Allergies  Allergies  Allergen Reactions   Aspirin Hives   Codeine Hives   Fluticasone-Salmeterol Hives   Montelukast Sodium Hives   Adhesive [Tape] Rash   Cephalexin Rash   Ibuprofen Rash   Latex Rash   Statins Rash   Sulfonamide Derivatives Rash    History of Present Illness    FRANTZ QUATTRONE is a PMH of HTN, CAD, abdominal aortic aneurysm without rupture, ED, seasonal allergies, mixed COPD, GERD, hip fracture, hyperlipidemia, obesity, and nocturnal leg cramps.  He was seen by Dr. Flora Lipps 08/20/2021.  During that time his blood pressure was elevated at 166/72.  He had not yet taken his medicine.  He denied dizziness or lightheadedness.  He denied chest pain and shortness of breath.  He was walking with a walker.  He reported doing exercise in his house.  He denied recent falls.  His blood pressure was well-controlled at home.  His LDL cholesterol was noted to be around 63.  LDL was at goal due to history of CAD and PAD.  He underwent ultrasound of his abdomen previous year which showed stable aortic aneurysm  measuring 48 mm.  AAA monitored by vascular surgery and he was encouraged to have repeat evaluation.  Follow-up was planned for 1 year.  He presents to the clinic today for follow-up evaluation and states he feels well.  He enjoyed Christmas with his family yesterday.  He has been using his stationary bike some and continues to exercise around his house.  He is somewhat limited in his mobility due to previous left hip fracture.  He continues to use his walker.  His blood pressure initially today is 157/89 and on recheck is 136/72.  His wife passed away last fall.  He continues to eat a low-sodium diet.  I will have him increase his physical activity as tolerated, continue  his current medication regimen, give salty 6 diet sheet, have him increase his fiber as tolerated.  Will plan follow-up in 1 year.  Today he denies chest pain, shortness of breath, lower extremity edema, fatigue, palpitations, melena, hematuria, hemoptysis, diaphoresis, weakness, presyncope, syncope, orthopnea, and PND.     Home Medications    Prior to Admission medications   Medication Sig Start Date End Date Taking? Authorizing Provider  aspirin EC 81 MG tablet Take 81 mg by mouth daily.    [provider]  Cholecalciferol (VITAMIN D-3) 125 MCG (5000 UT) TABS Take 5,000 Units by mouth daily. 10/03/19   West BaliMcClung, Sarah A, PA-C  doxycycline (VIBRAMYCIN) 100 MG capsule Take 1 capsule (100 mg total) by mouth 2 (two) times daily. 05/22/22   Particia NearingLane, Rachel Elizabeth, PA-C  DULERA 200-5 MCG/ACT AERO Inhale 2 puffs into the lungs 2 (two) times daily. 05/29/19   [provider]  escitalopram (LEXAPRO) 10 MG tablet Take 10 mg by mouth daily. 11/19/19   [provider]  ezetimibe (ZETIA) 10 MG tablet TAKE 1 TABLET BY MOUTH EVERY DAY 07/13/21   O'Neal, Ronnald RampWesley Thomas, MD  fexofenadine (ALLEGRA) 180 MG tablet Take 180 mg by mouth daily as needed for allergies.     [provider]  lisinopril (PRINIVIL,ZESTRIL) 10 MG tablet Take 10 mg by mouth daily.     [provider]  LORazepam (ATIVAN) 1 MG tablet Take 1 mg by mouth at bedtime as needed. 11/19/19   [provider]  predniSONE (DELTASONE) 5 MG tablet Take 5 mg by mouth daily. 06/15/21   [provider]  PROAIR HFA 108 (90 Base) MCG/ACT inhaler Inhale 2 puffs into the lungs every 4 (four) hours as needed for wheezing.  09/19/19   [provider]  triamcinolone cream (KENALOG) 0.1 % Apply 1 application topically 2 (two) times daily. 03/08/21   Bing NeighborsHarris, Kimberly S, FNP    Family History    Family History  Problem Relation Age of Onset   Alzheimer's disease Mother    Liver disease Sister     Lung cancer Brother    He indicated that his mother is deceased. He indicated that his father is deceased. He indicated that three of his four sisters are deceased. He indicated that only one of his three brothers is alive.  Social History    Social History   Socioeconomic History   Marital status: Married    Spouse name: Not on file   Number of children: 0   Years of education: Not on file   Highest education level: Not on file  Occupational History   Occupation: shuttle driver    Employer: [employer]    Comment: [employer]  Tobacco Use   Smoking status: Former  Packs/day: 1.00    Years: 37.00    Total pack years: 37.00    Types: Cigarettes    Quit date: 08/23/1992    Years since quitting: 30.0   Smokeless tobacco: Never  Vaping Use   Vaping Use: Never used  Substance and Sexual Activity   Alcohol use: Not Currently    Comment: socially    Drug use: No   Sexual activity: Not on file  Other Topics Concern   Not on file  Social History Narrative   Not on file   Social Determinants of Health   Financial Resource Strain: Not on file  Food Insecurity: Not on file  Transportation Needs: Not on file  Physical Activity: Not on file  Stress: Not on file  Social Connections: Not on file  Intimate Partner Violence: Not on file     Review of Systems    General:  No chills, fever, night sweats or weight changes.  Cardiovascular:  No chest pain, dyspnea on exertion, edema, orthopnea, palpitations, paroxysmal nocturnal dyspnea. Dermatological: No rash, lesions/masses Respiratory: No cough, dyspnea Urologic: No hematuria, dysuria Abdominal:   No nausea, vomiting, diarrhea, bright red blood per rectum, melena, or hematemesis Neurologic:  No visual changes, wkns, changes in mental status. All other systems reviewed and are otherwise negative except as noted above.  Physical Exam    VS:  BP 136/72   Pulse 73   Ht 5\' 8"  (1.727 m)   Wt 190 lb 6.4 oz (86.4 kg)   SpO2  95%   BMI 28.95 kg/m  , BMI Body mass index is 28.95 kg/m. GEN: Well nourished, well developed, in no acute distress. HEENT: normal. Neck: Supple, no JVD, carotid bruits, or masses. Cardiac: RRR, no murmurs, rubs, or gallops. No clubbing, cyanosis, edema.  Radials/DP/PT 2+ and equal bilaterally.  Respiratory:  Respirations regular and unlabored, clear to auscultation bilaterally. GI: Soft, nontender, nondistended, BS + x 4. MS: no deformity or atrophy. Skin: warm and dry, no rash. Neuro:  Strength and sensation are intact. Psych: Normal affect.  Accessory Clinical Findings    Recent Labs: No results found for requested labs within last 365 days.   Recent Lipid Panel    Component Value Date/Time   CHOL 170 03/05/2011 0835   TRIG 89.0 03/05/2011 0835   HDL 48.20 03/05/2011 0835   CHOLHDL 4 03/05/2011 0835   VLDL 17.8 03/05/2011 0835   LDLCALC 104 (H) 03/05/2011 0835         ECG personally reviewed by me today-sinus rhythm with first-degree AV block 73 bpm no ectopy.- No acute changes  Echocardiogram 01/13/2011 Study Conclusions   - Left ventricle: The cavity size was normal. Wall thickness was    normal. Systolic function was normal. The estimated ejection    fraction was in the range of 55% to 60%. Wall motion was normal;    there were no regional wall motion abnormalities. Left ventricular    diastolic function parameters were normal.  - Aortic valve: There was no stenosis. Trivial regurgitation.  - Mitral valve: Trivial regurgitation.  - Left atrium: The atrium was mildly dilated.  - Right ventricle: The cavity size was normal. Systolic function was    normal.  - Pulmonary arteries: PA peak pressure: 32mm Hg (S).  - Inferior vena cava: The vessel was normal in size; the    respirophasic diameter changes were in the normal range (= 50%);    findings are consistent with normal central venous pressure.  Impressions:   -  Normal LV size and systolic function, EF  55-60%. Normal diastolic    function. Normal RV size and systolic function. No significant    valvular abnormalities.   --------------------------------------------------------------------  Labs, prior tests, procedures, and surgery:  Coronary artery bypass grafting.  Transthoracic echocardiography. M-mode, complete 2D, spectral  Doppler, and color Doppler. Height: Height: 172.7cm. Height: 68in.  Weight: Weight: 97.1kg. Weight: 213.6lb. Body mass index: BMI:  32.5kg/m^2. Body surface area: BSA: 2.6m^2. Blood pressure: 160/86.  Patient status: Outpatient. Location: [place] [facility]   --------------------------------------------------------------------   --------------------------------------------------------------------  Left ventricle: The cavity size was normal. Wall thickness was  normal. Systolic function was normal. The estimated ejection  fraction was in the range of 55% to 60%. Wall motion was normal;  there were no regional wall motion abnormalities. The transmitral  flow pattern was normal. The deceleration time of the early  transmitral flow velocity was normal. The pulmonary vein flow  pattern was normal. The tissue Doppler parameters were normal. Left  ventricular diastolic function parameters were normal.   --------------------------------------------------------------------  Aortic valve: Trileaflet; mildly calcified leaflets. Doppler: There  was no stenosis. Trivial regurgitation.   --------------------------------------------------------------------  Aorta: Aortic root: The aortic root was normal in size.  Ascending aorta: The ascending aorta was normal in size.   --------------------------------------------------------------------  Mitral valve:  Doppler: There was no evidence for stenosis. Trivial  regurgitation.  Peak gradient: 73mm Hg (D).   --------------------------------------------------------------------  Left atrium: The atrium was mildly dilated.    --------------------------------------------------------------------  Right ventricle: The cavity size was normal. Systolic function was  normal.   --------------------------------------------------------------------  Pulmonic valve: Structurally normal valve. Cusp separation was  normal. Doppler: Transvalvular velocity was within the normal range.  Trivial regurgitation.   --------------------------------------------------------------------  Tricuspid valve:  Doppler: Trivial regurgitation.   --------------------------------------------------------------------  Right atrium: The atrium was normal in size.   --------------------------------------------------------------------  Pericardium: There was no pericardial effusion.   --------------------------------------------------------------------  Systemic veins:  Inferior vena cava: The vessel was normal in size; the respirophasic  diameter changes were in the normal range (= 50%); findings are  consistent with normal central venous pressure.    Assessment & Plan   1.  Coronary artery disease-denies recent episodes of arm neck back or chest discomfort.  He is status post CABG x 4, 2006.  Continues to be somewhat physically active exercising around his home. Continue  aspirin, ezetimibe, lisinopril Heart healthy low-sodium diet-salty 6 given Increase physical activity as tolerated  AAA-previously noted to be 48 mm with abdominal ultrasound.  Repeat AAA duplex showed 5.05 x 5.05 cm distal abdominal aorta dilation.  Aneurysm followed by vascular surgery. Follow-up with vascular surgery for repeat ultrasound annually.  Hyperlipidemia-LDL 72 on 05/27/22.  LDL cholesterol previously 63.  Statin intolerant.  Reviewed importance of increasing physical activity and increasing fiber in diet, reducing cholesterol. Continue ezetimibe, aspirin Heart healthy low-sodium high-fiber diet Increase physical activity as tolerated May use fiber  supplement  Essential hypertension-BP today 136/72.  Continues to be well-controlled at home. Continue lisinopril Increase physical activity as tolerated Secondary causes of hypertension reviewed.  Disposition: Follow-up with Dr. Flora Lipps or me in 12 months.   Thomasene Ripple. Jerell Demery NP-C     08/17/2022, 1:44 PM [facility] [address] Office [phone] Fax [phone]    I spent 14 minutes examining this patient, reviewing medications, and using patient centered shared decision making involving her cardiac care.  Prior to her visit I spent greater than 20  minutes reviewing her past medical history,  medications, and prior cardiac tests.

## 2022-08-17 ENCOUNTER — Ambulatory Visit: Payer: Medicare Other | Attending: Cardiovascular Disease | Admitting: General Practice

## 2022-08-17 ENCOUNTER — Encounter: Payer: Self-pay | Admitting: General Practice

## 2022-08-17 VITALS — BP 136/72 | HR 73 | Ht 68.0 in | Wt 190.4 lb

## 2022-08-17 DIAGNOSIS — E782 Mixed hyperlipidemia: Secondary | ICD-10-CM | POA: Diagnosis not present

## 2022-08-17 DIAGNOSIS — I10 Essential (primary) hypertension: Secondary | ICD-10-CM | POA: Diagnosis not present

## 2022-08-17 DIAGNOSIS — I25810 Atherosclerosis of coronary artery bypass graft(s) without angina pectoris: Secondary | ICD-10-CM

## 2022-08-17 DIAGNOSIS — I7143 Infrarenal abdominal aortic aneurysm, without rupture: Secondary | ICD-10-CM

## 2022-08-17 NOTE — Patient Instructions (Signed)
Medication Instructions:  The current medical regimen is effective;  continue present plan and medications as directed. Please refer to the Current Medication list given to you today.  *If you need a refill on your cardiac medications before your next appointment, please call your pharmacy*  Lab Work: NONE If you have labs (blood work) drawn today and your tests are completely normal, you will receive your results only by:  MyChart Message (if you have MyChart) OR  A paper copy in the mail If you have any lab test that is abnormal or we need to change your treatment, we will call you to review the results.  Testing/Procedures: NONE  Follow-Up: At [facility], you and your health needs are our priority.  As part of our continuing mission to provide you with exceptional heart care, we have created designated Provider Care Teams.  These Care Teams include your primary Cardiologist (physician) and Advanced Practice Providers (APPs -  Physician Assistants and Nurse Practitioners) who all work together to provide you with the care you need, when you need it.  Your next appointment:   12 month(s)  The format for your next appointment:   In Person  Provider:   Reatha Harps, MD    Other Instructions INCREASE PHYSICAL ACTIVITY AS TOLERATED  CONTINUE TO MONITOR BLOOD PRESSURE   PLEASE READ AND FOLLOW INCREASED FIBER DIET-MAY USE BENEFIBER OR METAMUCIL AS WELL  PLEASE READ AND FOLLOW ATTACHED  SALTY 6  Important Information About Sugar

## 2022-08-20 DIAGNOSIS — I10 Essential (primary) hypertension: Secondary | ICD-10-CM | POA: Diagnosis not present

## 2022-08-20 DIAGNOSIS — W57XXXA Bitten or stung by nonvenomous insect and other nonvenomous arthropods, initial encounter: Secondary | ICD-10-CM | POA: Diagnosis not present

## 2022-09-07 ENCOUNTER — Other Ambulatory Visit: Payer: Self-pay

## 2022-09-07 DIAGNOSIS — I714 Abdominal aortic aneurysm, without rupture, unspecified: Secondary | ICD-10-CM

## 2022-09-08 DIAGNOSIS — R600 Localized edema: Secondary | ICD-10-CM | POA: Diagnosis not present

## 2022-09-08 DIAGNOSIS — I10 Essential (primary) hypertension: Secondary | ICD-10-CM | POA: Diagnosis not present

## 2022-09-14 DIAGNOSIS — M79671 Pain in right foot: Secondary | ICD-10-CM | POA: Diagnosis not present

## 2022-09-14 DIAGNOSIS — L110 Acquired keratosis follicularis: Secondary | ICD-10-CM | POA: Diagnosis not present

## 2022-09-14 DIAGNOSIS — M79672 Pain in left foot: Secondary | ICD-10-CM | POA: Diagnosis not present

## 2022-09-14 DIAGNOSIS — I739 Peripheral vascular disease, unspecified: Secondary | ICD-10-CM | POA: Diagnosis not present

## 2022-10-13 ENCOUNTER — Ambulatory Visit: Payer: Medicare Other | Admitting: Vascular Surgery

## 2022-10-13 DIAGNOSIS — I10 Essential (primary) hypertension: Secondary | ICD-10-CM | POA: Diagnosis not present

## 2022-10-13 DIAGNOSIS — R600 Localized edema: Secondary | ICD-10-CM | POA: Diagnosis not present

## 2022-10-21 ENCOUNTER — Ambulatory Visit (HOSPITAL_COMMUNITY): Admission: RE | Admit: 2022-10-21 | Payer: Medicare Other | Source: Ambulatory Visit

## 2022-10-26 ENCOUNTER — Encounter (HOSPITAL_COMMUNITY): Payer: Self-pay

## 2022-10-26 ENCOUNTER — Ambulatory Visit (HOSPITAL_COMMUNITY)
Admission: RE | Admit: 2022-10-26 | Discharge: 2022-10-26 | Disposition: A | Discharge status: Home / Self Care | Payer: Medicare Other | Source: Ambulatory Visit | Attending: Vascular Surgery | Admitting: Vascular Surgery

## 2022-10-26 DIAGNOSIS — I714 Abdominal aortic aneurysm, without rupture, unspecified: Secondary | ICD-10-CM | POA: Diagnosis not present

## 2022-10-26 DIAGNOSIS — I701 Atherosclerosis of renal artery: Secondary | ICD-10-CM | POA: Diagnosis not present

## 2022-10-26 MED ORDER — IOHEXOL 350 MG/ML SOLN
75.0000 mL | Freq: Once | INTRAVENOUS | Status: AC | PRN
  Administered 2022-10-26: 75 mL via INTRAVENOUS

## 2022-10-27 ENCOUNTER — Ambulatory Visit: Payer: Medicare Other | Admitting: Vascular Surgery

## 2022-10-27 ENCOUNTER — Encounter: Payer: Self-pay | Admitting: Vascular Surgery

## 2022-10-27 VITALS — BP 139/77 | HR 69 | Temp 97.9°F | Ht 68.0 in | Wt 199.8 lb

## 2022-10-27 DIAGNOSIS — I7143 Infrarenal abdominal aortic aneurysm, without rupture: Secondary | ICD-10-CM

## 2022-10-27 NOTE — Progress Notes (Signed)
[facility] [place]  Patient name: Thomas [name] MRN: [id number] DOB: 1941/04/28 Sex: male  REASON FOR VISIT: Follow-up known infrarenal aortic aneurysm  HPI: Thomas [name] is a 82 y.o. male today for follow-up.  He has a known history of infrarenal abdominal aortic aneurysm.  This is had serial follow-up over many years.  Most recently saw him 6 months ago with ultrasound suggesting maximal diameter of 5.25 cm.  He is seen today with recent CT angiogram follow-up.  He remains active.  He is a widower.  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  Past Medical History:  Diagnosis Date   AAA (abdominal aortic aneurysm) ([place])    4.2 cm by CTA 10/01/13- states "much smaller than first expected"Dr. Laurann Montana follows-checks every 2 yrs   Allergic asthma    Allergic rhinitis    Anxiety depression   Arthritis    torn rotator cuff left shoulder   Bruise    left chest area"recent fall"-no rib fracture identified.   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   Colitis    GERD (gastroesophageal reflux disease)    History of measles    History of mumps    HLD (hyperlipidemia)    HTN (hypertension)    Obesity    Polio    left leg   Pupil irregularity, right    old injury"pupil will not contract to light"   Shortness of breath    exertion, asthma attack    Family History  Problem Relation Age of Onset   Alzheimer's disease Mother    Liver disease Sister    Lung cancer Brother     SOCIAL HISTORY: Social History   Tobacco Use   Smoking status: Former    Packs/day: 1.00    Years: 37.00    Total pack years: 37.00    Types: Cigarettes    Quit date: 08/23/1992    Years since quitting: 30.1   Smokeless tobacco: Never  Substance Use Topics   Alcohol use: Not Currently    Comment: socially     Allergies  Allergen Reactions   Aspirin Hives   Codeine Hives    Other Reaction(s): whelps, Hydrocodone-  ok, no problemHy   Fluticasone-Salmeterol Hives   Cephalexin Rash   Hydrochlorothiazide Rash   Ibuprofen Rash   Latex Rash   Montelukast Sodium Rash   Penicillins Rash   Statins Rash   Sulfonamide Derivatives Rash   Tape Rash    Other Reaction(s): rash and blisters    Current Outpatient Medications  Medication Sig Dispense Refill   aspirin EC 81 MG tablet Take 81 mg by mouth daily.     budesonide-formoterol (SYMBICORT) 160-4.5 MCG/ACT inhaler 2 puffs Inhalation Twice a day     escitalopram (LEXAPRO) 10 MG tablet Take 10 mg by mouth daily.     ezetimibe (ZETIA) 10 MG tablet TAKE 1 TABLET BY MOUTH EVERY DAY 90 tablet 3   fexofenadine (ALLEGRA) 180 MG tablet Take 180 mg by mouth daily as needed for allergies.      furosemide (LASIX) 20 MG tablet Take 20 mg by mouth as needed for fluid.     lisinopril (PRINIVIL,ZESTRIL) 10 MG tablet Take 10 mg by mouth daily.      LORazepam (ATIVAN) 1 MG tablet Take 1 mg by mouth at bedtime as needed.     predniSONE (DELTASONE) 5 MG tablet Take 5 mg by mouth daily.  PROAIR HFA 108 (90 Base) MCG/ACT inhaler Inhale 2 puffs into the lungs every 4 (four) hours as needed for wheezing.      Cholecalciferol (VITAMIN D-3) 125 MCG (5000 UT) TABS Take 5,000 Units by mouth daily. (Patient not taking: Reported on 08/17/2022) 90 tablet 1   doxycycline (VIBRAMYCIN) 100 MG capsule Take 1 capsule (100 mg total) by mouth 2 (two) times daily. (Patient not taking: Reported on 10/27/2022) 14 capsule 0   DULERA 200-5 MCG/ACT AERO Inhale 2 puffs into the lungs 2 (two) times daily. (Patient not taking: Reported on 08/17/2022)     triamcinolone cream (KENALOG) 0.1 % Apply 1 application topically 2 (two) times daily. (Patient not taking: Reported on 08/17/2022) 60 g 0   No current facility-administered medications for this visit.    REVIEW OF SYSTEMS:  '[X]'$  denotes positive finding, '[ ]'$  denotes negative finding Cardiac  Comments:  Chest pain or chest pressure:    Shortness  of breath upon exertion:    Short of breath when lying flat:    Irregular heart rhythm:        Vascular    Pain in calf, thigh, or hip brought on by ambulation:    Pain in feet at night that wakes you up from your sleep:     Blood clot in your veins:    Leg swelling:           PHYSICAL EXAM: Vitals:   10/27/22 1311  BP: 139/77  Pulse: 69  Temp: 97.9 F (36.6 C)  SpO2: 96%  Weight: 199 lb 12.8 oz (90.6 kg)  Height: '5\' 8"'$  (1.727 m)    GENERAL: The patient is a well-nourished male, in no acute distress. The vital signs are documented above. CARDIOVASCULAR: I do not palpate an aneurysm.  He has no abdominal tenderness.  He has 2+ femoral, popliteal and dorsalis pedis pulses with no evidence of peripheral artery aneurysm PULMONARY: There is good air exchange  MUSCULOSKELETAL: There are no major deformities or cyanosis. NEUROLOGIC: No focal weakness or paresthesias are detected. SKIN: There are no ulcers or rashes noted. PSYCHIATRIC: The patient has a normal affect.  DATA:  CT angiogram from yesterday was reviewed with the patient.  This shows maximal diameter of his infrarenal aneurysm of 5.1 cm.  Most recent CT scan was 2019 with a maximal diameter of 4.8 cm.  MEDICAL ISSUES: Stable overall.  Slow continued growth of his aneurysm now at 5.1 cm by CT scan.  I again reviewed symptoms of leaking aneurysm and he knows to report immediately to the emergency room should this occur.  Otherwise we will see him again in 6 months with ultrasound follow-up.  He reports that he is moving back to [place] and we will therefore see him in our [place] office in 6 months    Rosetta Posner, MD FACS [facility] [place] Office Tel [phone]  Note: Portions of this report may have been transcribed using voice recognition software.  Every effort has been made to ensure accuracy; however, inadvertent computerized transcription errors may still be present.

## 2022-11-30 DIAGNOSIS — L110 Acquired keratosis follicularis: Secondary | ICD-10-CM | POA: Diagnosis not present

## 2022-11-30 DIAGNOSIS — M79672 Pain in left foot: Secondary | ICD-10-CM | POA: Diagnosis not present

## 2022-11-30 DIAGNOSIS — M79671 Pain in right foot: Secondary | ICD-10-CM | POA: Diagnosis not present

## 2022-11-30 DIAGNOSIS — I739 Peripheral vascular disease, unspecified: Secondary | ICD-10-CM | POA: Diagnosis not present

## 2022-12-15 ENCOUNTER — Other Ambulatory Visit (HOSPITAL_COMMUNITY): Payer: Medicare Other

## 2022-12-23 DIAGNOSIS — Z9989 Dependence on other enabling machines and devices: Secondary | ICD-10-CM | POA: Diagnosis not present

## 2022-12-23 DIAGNOSIS — J449 Chronic obstructive pulmonary disease, unspecified: Secondary | ICD-10-CM | POA: Diagnosis not present

## 2022-12-23 DIAGNOSIS — S81802A Unspecified open wound, left lower leg, initial encounter: Secondary | ICD-10-CM | POA: Diagnosis not present

## 2022-12-23 DIAGNOSIS — L03116 Cellulitis of left lower limb: Secondary | ICD-10-CM | POA: Diagnosis not present

## 2023-01-04 DIAGNOSIS — M7918 Myalgia, other site: Secondary | ICD-10-CM | POA: Diagnosis not present

## 2023-01-04 DIAGNOSIS — I10 Essential (primary) hypertension: Secondary | ICD-10-CM | POA: Diagnosis not present

## 2023-01-04 DIAGNOSIS — L03116 Cellulitis of left lower limb: Secondary | ICD-10-CM | POA: Diagnosis not present

## 2023-02-15 DIAGNOSIS — L110 Acquired keratosis follicularis: Secondary | ICD-10-CM | POA: Diagnosis not present

## 2023-02-15 DIAGNOSIS — M79672 Pain in left foot: Secondary | ICD-10-CM | POA: Diagnosis not present

## 2023-02-15 DIAGNOSIS — I739 Peripheral vascular disease, unspecified: Secondary | ICD-10-CM | POA: Diagnosis not present

## 2023-02-15 DIAGNOSIS — M79671 Pain in right foot: Secondary | ICD-10-CM | POA: Diagnosis not present

## 2023-03-29 DIAGNOSIS — L089 Local infection of the skin and subcutaneous tissue, unspecified: Secondary | ICD-10-CM | POA: Diagnosis not present

## 2023-04-08 ENCOUNTER — Inpatient Hospital Stay (HOSPITAL_COMMUNITY)
Admission: EM | Admit: 2023-04-08 | Discharge: 2023-04-11 | DRG: 392 | Disposition: A | Discharge status: Home / Self Care | Payer: Medicare Other | Attending: Internal Medicine | Admitting: Internal Medicine

## 2023-04-08 ENCOUNTER — Emergency Department (HOSPITAL_COMMUNITY): Payer: Medicare Other

## 2023-04-08 ENCOUNTER — Encounter (HOSPITAL_COMMUNITY): Payer: Self-pay | Admitting: Internal Medicine

## 2023-04-08 ENCOUNTER — Other Ambulatory Visit: Payer: Self-pay

## 2023-04-08 DIAGNOSIS — K219 Gastro-esophageal reflux disease without esophagitis: Secondary | ICD-10-CM | POA: Diagnosis present

## 2023-04-08 DIAGNOSIS — R404 Transient alteration of awareness: Secondary | ICD-10-CM | POA: Diagnosis not present

## 2023-04-08 DIAGNOSIS — F32A Depression, unspecified: Secondary | ICD-10-CM | POA: Diagnosis not present

## 2023-04-08 DIAGNOSIS — M47816 Spondylosis without myelopathy or radiculopathy, lumbar region: Secondary | ICD-10-CM | POA: Diagnosis present

## 2023-04-08 DIAGNOSIS — Z888 Allergy status to other drugs, medicaments and biological substances status: Secondary | ICD-10-CM

## 2023-04-08 DIAGNOSIS — R159 Full incontinence of feces: Secondary | ICD-10-CM | POA: Diagnosis present

## 2023-04-08 DIAGNOSIS — J4489 Other specified chronic obstructive pulmonary disease: Secondary | ICD-10-CM | POA: Diagnosis not present

## 2023-04-08 DIAGNOSIS — Z881 Allergy status to other antibiotic agents status: Secondary | ICD-10-CM

## 2023-04-08 DIAGNOSIS — E785 Hyperlipidemia, unspecified: Secondary | ICD-10-CM | POA: Diagnosis not present

## 2023-04-08 DIAGNOSIS — R300 Dysuria: Secondary | ICD-10-CM | POA: Diagnosis not present

## 2023-04-08 DIAGNOSIS — Z8612 Personal history of poliomyelitis: Secondary | ICD-10-CM

## 2023-04-08 DIAGNOSIS — M1990 Unspecified osteoarthritis, unspecified site: Secondary | ICD-10-CM | POA: Diagnosis not present

## 2023-04-08 DIAGNOSIS — J449 Chronic obstructive pulmonary disease, unspecified: Secondary | ICD-10-CM | POA: Diagnosis not present

## 2023-04-08 DIAGNOSIS — Z951 Presence of aortocoronary bypass graft: Secondary | ICD-10-CM | POA: Diagnosis not present

## 2023-04-08 DIAGNOSIS — Z7952 Long term (current) use of systemic steroids: Secondary | ICD-10-CM

## 2023-04-08 DIAGNOSIS — Z79899 Other long term (current) drug therapy: Secondary | ICD-10-CM

## 2023-04-08 DIAGNOSIS — J309 Allergic rhinitis, unspecified: Secondary | ICD-10-CM | POA: Diagnosis present

## 2023-04-08 DIAGNOSIS — Z7982 Long term (current) use of aspirin: Secondary | ICD-10-CM

## 2023-04-08 DIAGNOSIS — Z9109 Other allergy status, other than to drugs and biological substances: Secondary | ICD-10-CM

## 2023-04-08 DIAGNOSIS — Z880 Allergy status to penicillin: Secondary | ICD-10-CM

## 2023-04-08 DIAGNOSIS — R32 Unspecified urinary incontinence: Secondary | ICD-10-CM | POA: Diagnosis present

## 2023-04-08 DIAGNOSIS — I714 Abdominal aortic aneurysm, without rupture, unspecified: Secondary | ICD-10-CM | POA: Diagnosis not present

## 2023-04-08 DIAGNOSIS — Z8619 Personal history of other infectious and parasitic diseases: Secondary | ICD-10-CM | POA: Diagnosis not present

## 2023-04-08 DIAGNOSIS — F419 Anxiety disorder, unspecified: Secondary | ICD-10-CM | POA: Diagnosis present

## 2023-04-08 DIAGNOSIS — K8020 Calculus of gallbladder without cholecystitis without obstruction: Secondary | ICD-10-CM | POA: Diagnosis present

## 2023-04-08 DIAGNOSIS — Z1152 Encounter for screening for COVID-19: Secondary | ICD-10-CM

## 2023-04-08 DIAGNOSIS — Z7951 Long term (current) use of inhaled steroids: Secondary | ICD-10-CM

## 2023-04-08 DIAGNOSIS — Z885 Allergy status to narcotic agent status: Secondary | ICD-10-CM

## 2023-04-08 DIAGNOSIS — I7143 Infrarenal abdominal aortic aneurysm, without rupture: Secondary | ICD-10-CM | POA: Diagnosis not present

## 2023-04-08 DIAGNOSIS — G14 Postpolio syndrome: Secondary | ICD-10-CM | POA: Diagnosis present

## 2023-04-08 DIAGNOSIS — Z91040 Latex allergy status: Secondary | ICD-10-CM

## 2023-04-08 DIAGNOSIS — R55 Syncope and collapse: Secondary | ICD-10-CM | POA: Diagnosis not present

## 2023-04-08 DIAGNOSIS — Z882 Allergy status to sulfonamides status: Secondary | ICD-10-CM

## 2023-04-08 DIAGNOSIS — R6889 Other general symptoms and signs: Secondary | ICD-10-CM | POA: Diagnosis not present

## 2023-04-08 DIAGNOSIS — Z87891 Personal history of nicotine dependence: Secondary | ICD-10-CM

## 2023-04-08 DIAGNOSIS — K5730 Diverticulosis of large intestine without perforation or abscess without bleeding: Secondary | ICD-10-CM | POA: Diagnosis not present

## 2023-04-08 DIAGNOSIS — Z886 Allergy status to analgesic agent status: Secondary | ICD-10-CM

## 2023-04-08 DIAGNOSIS — I2510 Atherosclerotic heart disease of native coronary artery without angina pectoris: Secondary | ICD-10-CM | POA: Diagnosis present

## 2023-04-08 DIAGNOSIS — N179 Acute kidney failure, unspecified: Secondary | ICD-10-CM | POA: Diagnosis not present

## 2023-04-08 DIAGNOSIS — R197 Diarrhea, unspecified: Secondary | ICD-10-CM

## 2023-04-08 DIAGNOSIS — E7849 Other hyperlipidemia: Secondary | ICD-10-CM

## 2023-04-08 DIAGNOSIS — I10 Essential (primary) hypertension: Secondary | ICD-10-CM | POA: Diagnosis present

## 2023-04-08 DIAGNOSIS — R61 Generalized hyperhidrosis: Secondary | ICD-10-CM | POA: Diagnosis not present

## 2023-04-08 DIAGNOSIS — R5383 Other fatigue: Secondary | ICD-10-CM | POA: Diagnosis not present

## 2023-04-08 DIAGNOSIS — Z743 Need for continuous supervision: Secondary | ICD-10-CM | POA: Diagnosis not present

## 2023-04-08 LAB — COMPREHENSIVE METABOLIC PANEL
ALT: 24 U/L (ref 0–44)
AST: 43 U/L — ABNORMAL HIGH (ref 15–41)
Albumin: 3.3 g/dL — ABNORMAL LOW (ref 3.5–5.0)
Alkaline Phosphatase: 45 U/L (ref 38–126)
Anion gap: 10 (ref 5–15)
BUN: 25 mg/dL — ABNORMAL HIGH (ref 8–23)
CO2: 17 mmol/L — ABNORMAL LOW (ref 22–32)
Calcium: 7.9 mg/dL — ABNORMAL LOW (ref 8.9–10.3)
Chloride: 109 mmol/L (ref 98–111)
Creatinine, Ser: 1.23 mg/dL (ref 0.61–1.24)
GFR, Estimated: 58 mL/min — ABNORMAL LOW (ref >60)
Glucose, Bld: 102 mg/dL — ABNORMAL HIGH (ref 70–99)
Potassium: 3.7 mmol/L (ref 3.5–5.1)
Sodium: 136 mmol/L (ref 135–145)
Total Bilirubin: 0.6 mg/dL (ref 0.3–1.2)
Total Protein: 6.6 g/dL (ref 6.5–8.1)

## 2023-04-08 LAB — CBC WITH DIFFERENTIAL/PLATELET
Abs Immature Granulocytes: 0.02 10*3/uL (ref 0.00–0.07)
Basophils Absolute: 0 10*3/uL (ref 0.0–0.1)
Basophils Relative: 0 %
Eosinophils Absolute: 0.1 10*3/uL (ref 0.0–0.5)
Eosinophils Relative: 1 %
HCT: 35.5 % — ABNORMAL LOW (ref 39.0–52.0)
Hemoglobin: 11.6 g/dL — ABNORMAL LOW (ref 13.0–17.0)
Immature Granulocytes: 0 %
Lymphocytes Relative: 14 %
Lymphs Abs: 0.8 10*3/uL (ref 0.7–4.0)
MCH: 31 pg (ref 26.0–34.0)
MCHC: 32.7 g/dL (ref 30.0–36.0)
MCV: 94.9 fL (ref 80.0–100.0)
Monocytes Absolute: 0.7 10*3/uL (ref 0.1–1.0)
Monocytes Relative: 11 %
Neutro Abs: 4.4 10*3/uL (ref 1.7–7.7)
Neutrophils Relative %: 74 %
Platelets: 166 10*3/uL (ref 150–400)
RBC: 3.74 MIL/uL — ABNORMAL LOW (ref 4.22–5.81)
RDW: 14.4 % (ref 11.5–15.5)
WBC: 6 10*3/uL (ref 4.0–10.5)
nRBC: 0 % (ref 0.0–0.2)

## 2023-04-08 LAB — URINALYSIS, ROUTINE W REFLEX MICROSCOPIC
Bacteria, UA: NONE SEEN
Bilirubin Urine: NEGATIVE
Glucose, UA: NEGATIVE mg/dL
Ketones, ur: 5 mg/dL — AB
Leukocytes,Ua: NEGATIVE
Nitrite: NEGATIVE
Protein, ur: NEGATIVE mg/dL
Specific Gravity, Urine: >1.046 — ABNORMAL HIGH (ref 1.005–1.030)
pH: 5 (ref 5.0–8.0)

## 2023-04-08 LAB — CBG MONITORING, ED: Glucose-Capillary: 81 mg/dL (ref 70–99)

## 2023-04-08 LAB — POC OCCULT BLOOD, ED: Fecal Occult Bld: NEGATIVE

## 2023-04-08 LAB — C DIFFICILE QUICK SCREEN W PCR REFLEX
C Diff antigen: NEGATIVE
C Diff interpretation: NOT DETECTED
C Diff toxin: NEGATIVE

## 2023-04-08 LAB — LIPASE, BLOOD: Lipase: 23 U/L (ref 11–51)

## 2023-04-08 LAB — SARS CORONAVIRUS 2 BY RT PCR: SARS Coronavirus 2 by RT PCR: NEGATIVE

## 2023-04-08 LAB — MAGNESIUM: Magnesium: 2 mg/dL (ref 1.7–2.4)

## 2023-04-08 LAB — TROPONIN I (HIGH SENSITIVITY)
Troponin I (High Sensitivity): 17 ng/L (ref <18)
Troponin I (High Sensitivity): 20 ng/L — ABNORMAL HIGH (ref <18)

## 2023-04-08 MED ORDER — ONDANSETRON HCL 4 MG/2ML IJ SOLN
4.0000 mg | Freq: Four times a day (QID) | INTRAMUSCULAR | Status: DC | PRN
  Administered 2023-04-08: 4 mg via INTRAVENOUS
  Filled 2023-04-08: qty 2

## 2023-04-08 MED ORDER — MOMETASONE FURO-FORMOTEROL FUM 200-5 MCG/ACT IN AERO
2.0000 | INHALATION_SPRAY | Freq: Two times a day (BID) | RESPIRATORY_TRACT | Status: DC
  Administered 2023-04-09 – 2023-04-11 (×5): 2 via RESPIRATORY_TRACT
  Filled 2023-04-08: qty 8.8

## 2023-04-08 MED ORDER — ACETAMINOPHEN 325 MG PO TABS: 650.0000 mg | ORAL_TABLET | Freq: Four times a day (QID) | ORAL | Status: DC | PRN

## 2023-04-08 MED ORDER — LABETALOL HCL 5 MG/ML IV SOLN
10.0000 mg | Freq: Once | INTRAVENOUS | Status: AC
  Administered 2023-04-08: 10 mg via INTRAVENOUS
  Filled 2023-04-08: qty 4

## 2023-04-08 MED ORDER — ONDANSETRON HCL 4 MG PO TABS
4.0000 mg | ORAL_TABLET | Freq: Four times a day (QID) | ORAL | Status: DC | PRN
  Administered 2023-04-10: 4 mg via ORAL
  Filled 2023-04-08: qty 1

## 2023-04-08 MED ORDER — LACTATED RINGERS IV SOLN: INTRAVENOUS | Status: AC

## 2023-04-08 MED ORDER — LORAZEPAM 1 MG PO TABS: 1.0000 mg | ORAL_TABLET | Freq: Every evening | ORAL | Status: DC | PRN

## 2023-04-08 MED ORDER — HEPARIN SODIUM (PORCINE) 5000 UNIT/ML IJ SOLN
5000.0000 [IU] | Freq: Three times a day (TID) | INTRAMUSCULAR | Status: DC
  Administered 2023-04-08 – 2023-04-11 (×9): 5000 [IU] via SUBCUTANEOUS
  Filled 2023-04-08 (×9): qty 1

## 2023-04-08 MED ORDER — MELATONIN 5 MG PO TABS: 10.0000 mg | ORAL_TABLET | Freq: Every evening | ORAL | Status: DC | PRN

## 2023-04-08 MED ORDER — EZETIMIBE 10 MG PO TABS
10.0000 mg | ORAL_TABLET | Freq: Every day | ORAL | Status: DC
  Administered 2023-04-09 – 2023-04-11 (×3): 10 mg via ORAL
  Filled 2023-04-08 (×3): qty 1

## 2023-04-08 MED ORDER — IOHEXOL 350 MG/ML SOLN
75.0000 mL | Freq: Once | INTRAVENOUS | Status: AC | PRN
  Administered 2023-04-08: 75 mL via INTRAVENOUS

## 2023-04-08 MED ORDER — ACETAMINOPHEN 650 MG RE SUPP: 650.0000 mg | Freq: Four times a day (QID) | RECTAL | Status: DC | PRN

## 2023-04-08 MED ORDER — LACTATED RINGERS IV BOLUS
1000.0000 mL | Freq: Once | INTRAVENOUS | Status: AC
  Administered 2023-04-08: 1000 mL via INTRAVENOUS

## 2023-04-08 NOTE — Assessment & Plan Note (Signed)
Stable. Continue with inhalers.

## 2023-04-08 NOTE — ED Provider Notes (Signed)
[place] EMERGENCY DEPARTMENT AT [facility] Provider Note   CSN: [id number] Arrival date & time: 04/08/23  1312     History  Chief Complaint  Patient presents with   Loss of Consciousness    Thomas [name] is a 82 y.o. male with PMH as listed below who presents BIB EMS from home where family found him unresponsive in his chair. Pt was diaphoretic, weak and lethargic. Incontinent of urine and stool when found. Family said pt was unresponsive for about 10 mins.  In the ED, patient is A&Ox4. Patient reports acute onset "excruciating" LLQ sharp abd pain that started today, lasted about 10 minutes. Happened also two weeks ago, also went away then. Happens mostly when he bends over. Also has had diarrhea that started this AM at 0300 AM. Noted some BRB in the diarrhea, a very small amount, not every time since AM. Has had approximately 6-7 watery bowel movements since that time. Denies any f/c, chest pain, shortness of breath, palpitations. Has had some dysuria but no hematuria. Denies laxative use recently. Denies recent changes to medicines. No recent travel. Did not eat anything unusual yesterday, actually did not eat much yesterday because did not have appetite. No other household members have diarrhea. He also endorses significant generalized weakness.    Past Medical History:  Diagnosis Date   AAA (abdominal aortic aneurysm) ([facility])    4.2 cm by CTA 10/01/13- states "much smaller than first expected"Dr. Valentina Lucks follows-checks every 2 yrs   Allergic asthma    Allergic rhinitis    Anxiety depression   Arthritis    torn rotator cuff left shoulder   Bruise    left chest area"recent fall"-no rib fracture identified.   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 Authorizing Provider  aspirin EC 81 MG tablet Take 81 mg by mouth daily.    [provider]  budesonide-formoterol (SYMBICORT) 160-4.5 MCG/ACT inhaler 2 puffs Inhalation Twice a day 11/04/21   [provider]  Cholecalciferol (VITAMIN D-3) 125 MCG (5000 UT) TABS Take 5,000 Units by mouth daily. Patient not taking: Reported on 08/17/2022 10/03/19   [place], PA-C  doxycycline (VIBRAMYCIN) 100 MG capsule Take 1 capsule (100 mg total) by mouth 2 (two) times daily. Patient not taking: Reported on 10/27/2022 05/22/22   Particia Nearing, PA-C  DULERA 200-5 MCG/ACT AERO Inhale 2 puffs into the lungs 2 (two) times daily. Patient not taking: Reported on 08/17/2022 05/29/19   [provider]  escitalopram (LEXAPRO) 10 MG tablet Take 10 mg by mouth daily. 11/19/19   [provider]  ezetimibe (ZETIA) 10 MG tablet TAKE 1 TABLET BY MOUTH EVERY DAY 07/13/21   O'Neal, Ronnald Ramp, MD  fexofenadine (ALLEGRA) 180 MG tablet Take 180 mg by mouth daily as needed for allergies.     [provider]  furosemide (LASIX) 20 MG tablet Take 20 mg by mouth as needed for fluid. 06/17/22  [provider]  lisinopril (PRINIVIL,ZESTRIL) 10 MG tablet Take 10 mg by mouth daily.     [provider]  LORazepam (ATIVAN) 1 MG tablet Take 1 mg by mouth at bedtime as needed. 11/19/19   [provider]  predniSONE (DELTASONE) 5 MG tablet Take 5 mg by mouth daily. 06/15/21   [provider]  PROAIR HFA 108 (90 Base) MCG/ACT inhaler Inhale 2 puffs into the lungs every 4 (four) hours as needed for wheezing.  09/19/19   [provider]  triamcinolone cream (KENALOG) 0.1 % Apply 1 application topically 2 (two) times daily. Patient not taking: Reported  on 08/17/2022 03/08/21   Bing Neighbors, NP      Allergies    Aspirin, Codeine, Fluticasone-salmeterol, Cephalexin, Hydrochlorothiazide, Ibuprofen, Latex, Montelukast sodium, Penicillins, Statins, Sulfonamide derivatives, and Tape    Review of Systems   Review of Systems A 10 point review of systems was performed and is negative unless otherwise reported in HPI.  Physical Exam Updated Vital Signs BP 135/68   Pulse 69   Temp (!) 97.4 F (36.3 C) (Oral)   Resp 18   Ht 5\' 8"  (1.727 m)   Wt 88 kg   SpO2 94%   BMI 29.50 kg/m  Physical Exam General: Normal appearing elderly male, lying in bed.  HEENT: Sclera anicteric, dry mucous membranes, trachea midline.  Cardiology: RRR, no murmurs/rubs/gallops.  Resp: Normal respiratory rate and effort. CTAB, no wheezes, rhonchi, crackles.  Abd: Soft, tender palpation in the left lower quadrant, non-distended. No rebound tenderness or guarding.  GU: Deferred. MSK: No peripheral edema or signs of trauma. Skin: warm, dry.  Back: No CVA tenderness Neuro: A&Ox4, CNs II-XII grossly intact. MAEs. Sensation grossly intact.  Psych: Normal mood and affect.   ED Results / Procedures / Treatments   Labs (all labs ordered are listed, but only abnormal results are displayed) Labs Reviewed  CBC WITH DIFFERENTIAL/PLATELET - Abnormal; Notable for the following components:      Result Value   RBC 3.74 (*)    Hemoglobin 11.6 (*)    HCT 35.5 (*)    All other components within normal limits  COMPREHENSIVE METABOLIC PANEL - Abnormal; Notable for the following components:   CO2 17 (*)    Glucose, Bld 102 (*)    BUN 25 (*)    Calcium 7.9 (*)    Albumin 3.3 (*)    AST 43 (*)    GFR, Estimated 58 (*)    All other components within normal limits  SARS CORONAVIRUS 2 BY RT PCR  MAGNESIUM  LIPASE, BLOOD  URINALYSIS, ROUTINE W REFLEX MICROSCOPIC  CBG MONITORING, ED  POC OCCULT BLOOD, ED  TROPONIN I (HIGH SENSITIVITY)  TROPONIN I (HIGH SENSITIVITY)     EKG None  Radiology No results found.  Procedures Procedures    Medications Ordered in ED Medications  lactated ringers bolus 1,000 mL (has no administration in time range)    ED Course/ Medical Decision Making/ A&P                          Medical Decision Making Amount and/or Complexity of Data Reviewed Labs: ordered. Decision-making details documented in ED Course. Radiology: ordered.    This patient presents to the ED for concern of LLQ pain, diarrhea, syncope; this involves an extensive number of treatment options, and is a complaint that carries with it a high risk of complications and morbidity.  I considered  the following differential and admission for this acute, potentially life threatening condition.   MDM:    Patient with reported left lower quadrant abdominal pain, watery diarrhea, and syncope occurring today.  For the episode of loss of consciousness he was incontinent of bladder and bowel, must consider seizure however with water losses from diarrhea reported and no history of seizures I think syncope is more likely.  He does not have any chest pain or shortness of breath or palpitations reported but must consider ACS arrhythmia.  Consider electrolyte derangements, renal injury, dehydration, orthostasis in the setting of water losses from diarrhea.  In this clinical context I believe that orthostatic or vasovagal syncope is most likely. His mucous membranes are dry and he appears volume down. He has stable BP and no tachycardia which is reassuring against hypovolemic shock. Other possible causes include anemia, hypo-/hyperglycemia.  He is neuro intact and lower concern for CVA or ICH.  He was sitting on the toilet he did not fall or have any head trauma, he is NCAT on exam.  Considered PE but without associated hypoxia, shortness of breath, tachycardia, tachypnea I believe that is less likely.  He does not have any symptoms of heart failure or aortic dissection.  He  does have a history of a AAA and this will be evaluated as well on his CT scan.  Etiology of diarrhea is also yet unclear, he has not traveled or had any known food reaction or fevers or chills indicate traveler's diarrhea, bacterial diarrhea.  With his left lower quadrant pain and tenderness palpation I do suspect diverticulitis and will obtain CT abdomen pelvis.  Also consider viral infection and noninfectious etiologies such as IBS, SBO.   Will be given IVF 1L LR. He states he has 1/10 pain currently and does not want pain control at this time.   Clinical Course as of 04/08/23 1602  Fri Apr 08, 2023  1432 WBC: 6.0 No leukocytosis  [HN]  1433 Hemoglobin(!): 11.6 Stable from 3 years ago [HN]  1433 Creatinine: 1.23 Mild elevation from 1 [HN]  1433 Troponin I (High Sensitivity): 17 Neg initial trop [HN]  1433 Lipase: 23 wnl [HN]  1538 Received sign out from Dr. Jearld Fenton pending CT A/P and remainder of labs. Had likely syncope today, unwitnessed. Was having some diarrhea and lower abdominal pain. Didn't fall or hit head.  [WS]    Clinical Course User Index [HN] Loetta Rough, MD [WS] Lonell Grandchild, MD    Labs: I Ordered, and personally interpreted labs.  The pertinent results include:  those listed above  Imaging Studies ordered: I ordered imaging studies including CT abd pelvis w contrast I independently visualized and interpreted imaging. I agree with the radiologist interpretation  Additional history obtained from chart review, EMS.    Cardiac Monitoring: The patient was maintained on a cardiac monitor.  I personally viewed and interpreted the cardiac monitored which showed an underlying rhythm of: NSR  Social Determinants of Health: Lives independently  Disposition:  Patient will be handed off to oncoming physician Dr. Suezanne Jacquet pending CT abd pelvis and remainder of lab w/u.   Co morbidities that complicate the patient evaluation  Past Medical History:   Diagnosis Date   AAA (abdominal aortic aneurysm) ([facility])    4.2 cm by CTA 10/01/13- states "much smaller than first expected"Dr. Valentina Lucks follows-checks every 2 yrs   Allergic asthma    Allergic rhinitis    Anxiety depression   Arthritis    torn  rotator cuff left shoulder   Bruise    left chest area"recent fall"-no rib fracture identified.   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   Colitis    GERD (gastroesophageal reflux disease)    History of measles    History of mumps    HLD (hyperlipidemia)    HTN (hypertension)    Obesity    Polio    left leg   Pupil irregularity, right    old injury"pupil will not contract to light"   Shortness of breath    exertion, asthma attack     Medicines Meds ordered this encounter  Medications   lactated ringers bolus 1,000 mL    I have reviewed the patients home medicines and have made adjustments as needed  Problem List / ED Course: Problem List Items Addressed This Visit   None               This note was created using dictation software, which may contain spelling or grammatical errors.    Loetta Rough, MD 04/08/23 [phone]

## 2023-04-08 NOTE — Assessment & Plan Note (Addendum)
Observation. Likely due to volume loss from acute diarrhea. C. Diff pending. Continue on IVF. Check orthostatics

## 2023-04-08 NOTE — ED Notes (Signed)
ED TO INPATIENT HANDOFF REPORT  ED Nurse Name and Phone #: Murlean Iba PM [phone]  S Name/Age/Gender Thomas [name] 82 y.o. male Room/Bed: 019C/019C  Code Status   Code Status: Full Code  Home/SNF/Other Home Patient oriented to: self, place, time, and situation Is this baseline? Yes   Triage Complete: Triage complete  Chief Complaint Syncope [R55]  Triage Note Pt BIB EMS from home where family found him unresponsive in his chair. Pt was diaphoretic, weak and lethargic. Incontinent of urine and stool when found. C/o left quadrant pain and watery diarrhea since this morning at 0300, gone at least 7 times. Family said pt was unresponsive for about 10 mins. Has cardiac hx and diagnosed Afib. Aox4.    Allergies Allergies  Allergen Reactions   Aspirin Hives   Codeine Hives    Other Reaction(s): whelps, Hydrocodone- ok, no problemHy   Fluticasone-Salmeterol Hives   Cephalexin Rash   Hydrochlorothiazide Rash   Ibuprofen Rash   Latex Rash   Montelukast Sodium Rash   Penicillins Rash   Statins Rash   Sulfonamide Derivatives Rash   Tape Rash    Other Reaction(s): rash and blisters    Level of Care/Admitting Diagnosis ED Disposition     ED Disposition  Admit   Condition  --   Comment  Hospital Area: [facility] [[id number]]  Level of Care: Telemetry Medical [104]  May place patient in observation at [facility] or [facility] if equivalent level of care is available:: No  Covid Evaluation: Asymptomatic - no recent exposure (last 10 days) testing not required  Diagnosis: Syncope [206001]  Admitting Physician: Imogene Burn, ERIC [[id number]]  Attending Physician: Imogene Burn, ERIC [[id number]]          B Medical/Surgery History Past Medical History:  Diagnosis Date   AAA (abdominal aortic aneurysm) (HCC)    4.2 cm by CTA 10/01/13- states "much smaller than first expected"Dr. Valentina Lucks follows-checks every 2 yrs   Allergic asthma    Allergic rhinitis    Anxiety depression    Arthritis    torn rotator cuff left shoulder   Bruise    left chest area"recent fall"-no rib fracture identified.   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   Closed left hip fracture, initial encounter (HCC) 10/01/2019   Colitis    GERD (gastroesophageal reflux disease)    History of measles    History of mumps    HLD (hyperlipidemia)    HTN (hypertension)    Obesity    Polio    left leg   Pupil irregularity, right    old injury"pupil will not contract to light"   Shortness of breath    exertion, asthma attack   Past Surgical History:  Procedure Laterality Date   CARDIAC CATHETERIZATION     2006- 1 week prior tp CABPG   CATARACT EXTRACTION     COLONOSCOPY WITH PROPOFOL N/A 04/27/2016   Procedure: COLONOSCOPY WITH PROPOFOL;  Surgeon: Charolett Bumpers, MD;  Location: [facility];  Service: Endoscopy;  Laterality: N/A;   CORONARY ARTERY BYPASS GRAFT  3/06   x4 vessel bypass [facility]-No problems since.   EYE SURGERY     rebuild eye socket (R)   HIP PINNING,CANNULATED Left 10/01/2019   HIP PINNING,CANNULATED Left 10/01/2019   Procedure: CANNULATED HIP PINNING;  Surgeon: Roby Lofts, MD;  Location: [facility] OR;  Service: Orthopedics;  Laterality: Left;   KNEE ARTHROSCOPY Left    left knee scope   LUMBAR  DISC SURGERY  2011   LUMBAR LAMINECTOMY/DECOMPRESSION MICRODISCECTOMY Left 11/07/2013   Procedure: LUMBAR LAMINECTOMY/DECOMPRESSION MICRODISCECTOMY 1 LEVEL lumbar two/three;  Surgeon: Tia Alert, MD;  Location: [facility] NEURO ORS;  Service: Neurosurgery;  Laterality: Left;   NASAL SINUS SURGERY     SHOULDER SURGERY       A IV Location/Drains/Wounds Patient Lines/Drains/Airways Status     Active Line/Drains/Airways     Name Placement date Placement time Site Days   Peripheral IV 04/08/23 18 G Anterior;Left;Proximal Forearm 04/08/23  1458  Forearm  less than 1            Intake/Output Last 24 hours  Intake/Output Summary (Last 24 hours) at 04/08/2023 2134 Last data filed  at 04/08/2023 1949 Gross per 24 hour  Intake 1000 ml  Output --  Net 1000 ml    Labs/Imaging Results for orders placed or performed during the hospital encounter of 04/08/23 (from the past 48 hour(s))  CBC with Differential     Status: Abnormal   Collection Time: 04/08/23  1:45 PM  Result Value Ref Range   WBC 6.0 4.0 - 10.5 K/uL   RBC 3.74 (L) 4.22 - 5.81 MIL/uL   Hemoglobin 11.6 (L) 13.0 - 17.0 g/dL   HCT 19.1 (L) 47.8 - 29.5 %   MCV 94.9 80.0 - 100.0 fL   MCH 31.0 26.0 - 34.0 pg   MCHC 32.7 30.0 - 36.0 g/dL   RDW 62.1 30.8 - 65.7 %   Platelets 166 150 - 400 K/uL   nRBC 0.0 0.0 - 0.2 %   Neutrophils Relative % 74 %   Neutro Abs 4.4 1.7 - 7.7 K/uL   Lymphocytes Relative 14 %   Lymphs Abs 0.8 0.7 - 4.0 K/uL   Monocytes Relative 11 %   Monocytes Absolute 0.7 0.1 - 1.0 K/uL   Eosinophils Relative 1 %   Eosinophils Absolute 0.1 0.0 - 0.5 K/uL   Basophils Relative 0 %   Basophils Absolute 0.0 0.0 - 0.1 K/uL   Immature Granulocytes 0 %   Abs Immature Granulocytes 0.02 0.00 - 0.07 K/uL    Comment: Performed at [facility], [address], [place], [place] [address]  Comprehensive metabolic panel     Status: Abnormal   Collection Time: 04/08/23  1:45 PM  Result Value Ref Range   Sodium 136 135 - 145 mmol/L   Potassium 3.7 3.5 - 5.1 mmol/L   Chloride 109 98 - 111 mmol/L   CO2 17 (L) 22 - 32 mmol/L   Glucose, Bld 102 (H) 70 - 99 mg/dL    Comment: Glucose reference range applies only to samples taken after fasting for at least 8 hours.   BUN 25 (H) 8 - 23 mg/dL   Creatinine, Ser 2.95 0.61 - 1.24 mg/dL   Calcium 7.9 (L) 8.9 - 10.3 mg/dL   Total Protein 6.6 6.5 - 8.1 g/dL   Albumin 3.3 (L) 3.5 - 5.0 g/dL   AST 43 (H) 15 - 41 U/L   ALT 24 0 - 44 U/L   Alkaline Phosphatase 45 38 - 126 U/L   Total Bilirubin 0.6 0.3 - 1.2 mg/dL   GFR, Estimated 58 (L) >60 mL/min    Comment: (NOTE) Calculated using the CKD-EPI Creatinine Equation (2021)    Anion gap 10 5 - 15     Comment: Performed at [facility], [address] [place], [place] [address]  Magnesium     Status: None  Collection Time: 04/08/23  1:45 PM  Result Value Ref Range   Magnesium 2.0 1.7 - 2.4 mg/dL    Comment: Performed at [facility], [address] [place], [place] [address]  Troponin I (High Sensitivity)     Status: None   Collection Time: 04/08/23  1:45 PM  Result Value Ref Range   Troponin I (High Sensitivity) 17 <18 ng/L    Comment: (NOTE) Elevated high sensitivity troponin I (hsTnI) values and significant  changes across serial measurements may suggest ACS but many other  chronic and acute conditions are known to elevate hsTnI results.  Refer to the "Links" section for chest pain algorithms and additional  guidance. Performed at [facility], [address] [place], [place] [address]   Lipase, blood     Status: None   Collection Time: 04/08/23  1:45 PM  Result Value Ref Range   Lipase 23 11 - 51 U/L    Comment: Performed at [facility], [address], [place], [place] [address]  SARS Coronavirus 2 by RT PCR (hospital order, performed in [facility] hospital lab) *cepheid single result test* Anterior Nasal Swab     Status: None   Collection Time: 04/08/23  1:45 PM   Specimen: Anterior Nasal Swab  Result Value Ref Range   SARS Coronavirus 2 by RT PCR NEGATIVE NEGATIVE    Comment: Performed at [facility], [address] [place], [place] [address]  C Difficile Quick Screen w PCR reflex     Status: None   Collection Time: 04/08/23  5:21 PM   Specimen: STOOL  Result Value Ref Range   C Diff antigen NEGATIVE NEGATIVE   C Diff toxin NEGATIVE NEGATIVE   C Diff interpretation No C. difficile detected.     Comment: Performed at [facility], [address], [place], [place] [address]  POC CBG, ED     Status: None   Collection Time: 04/08/23  6:07 PM  Result Value Ref Range   Glucose-Capillary 81 70 - 99 mg/dL    Comment: Glucose  reference range applies only to samples taken after fasting for at least 8 hours.  POC occult blood, ED RN will collect     Status: None   Collection Time: 04/08/23  6:44 PM  Result Value Ref Range   Fecal Occult Bld NEGATIVE NEGATIVE  Urinalysis, Routine w reflex microscopic -Urine, Clean Catch     Status: Abnormal   Collection Time: 04/08/23  8:16 PM  Result Value Ref Range   Color, Urine YELLOW YELLOW   APPearance CLEAR CLEAR   Specific Gravity, Urine >1.046 (H) 1.005 - 1.030   pH 5.0 5.0 - 8.0   Glucose, UA NEGATIVE NEGATIVE mg/dL   Hgb urine dipstick SMALL (A) NEGATIVE   Bilirubin Urine NEGATIVE NEGATIVE   Ketones, ur 5 (A) NEGATIVE mg/dL   Protein, ur NEGATIVE NEGATIVE mg/dL   Nitrite NEGATIVE NEGATIVE   Leukocytes,Ua NEGATIVE NEGATIVE   RBC / HPF 0-5 0 - 5 RBC/hpf   WBC, UA 0-5 0 - 5 WBC/hpf   Bacteria, UA NONE SEEN NONE SEEN   Squamous Epithelial / HPF 0-5 0 - 5 /HPF   Mucus PRESENT    Hyaline Casts, UA PRESENT     Comment: Performed at [facility], [address] [place], [place] [address]  Troponin I (High Sensitivity)     Status: Abnormal   Collection Time: 04/08/23  8:16 PM  Result Value Ref Range   Troponin I (High Sensitivity) 20 (H) <18 ng/L    Comment: (NOTE) Elevated high sensitivity troponin I (hsTnI) values and significant  changes across serial measurements may suggest ACS but many other  chronic and acute conditions are known to elevate hsTnI results.  Refer to the "Links" section for chest pain algorithms and additional  guidance. Performed at [facility], [address], [place], [place] [address]    CT ABDOMEN PELVIS W CONTRAST  Result Date: 04/08/2023 CLINICAL DATA:  Left lower quadrant abdominal pain EXAM: CT ABDOMEN AND PELVIS WITH CONTRAST TECHNIQUE: Multidetector CT imaging of the abdomen and pelvis was performed using the standard protocol following bolus administration of intravenous contrast. RADIATION DOSE REDUCTION: This exam  was performed according to the departmental dose-optimization program which includes automated exposure control, adjustment of the mA and/or kV according to patient size and/or use of iterative reconstruction technique. CONTRAST:  75mL OMNIPAQUE IOHEXOL 350 MG/ML SOLN COMPARISON:  CT angio of the abdomen and pelvis 12/26/2022 FINDINGS: Lower chest: The lung bases are clear without focal nodule, mass, or airspace disease. The heart size is normal. Coronary artery calcifications are present. No significant pleural or pericardial effusion is present. Hepatobiliary: Liver is unremarkable. Multiple layering gallstones are similar the prior study. No inflammatory changes are present in the gallbladder. The common bile duct is within normal limits. Pancreas: Unremarkable. No pancreatic ductal dilatation or surrounding inflammatory changes. Spleen: Normal in size without focal abnormality. Adrenals/Urinary Tract: Adrenal glands are unremarkable. Kidneys are normal, without renal calculi, focal lesion, or hydronephrosis. Bladder is unremarkable. Stomach/Bowel: The stomach and duodenum are within normal limits. The small bowel is unremarkable. The appendix is not discretely visualized and may be surgically absent. The ascending and transverse colon are within normal limits. Diverticular changes are present in the descending and sigmoid colon. Moderate redundancy of the sigmoid colon is noted. No focal inflammatory changes are present. Vascular/Lymphatic: Extensive atherosclerotic changes are present in the aorta and Elius Etheredge vessels. The infrarenal abdominal aortic aneurysm measures 5.3 x 4.9 cm, demonstrating continued interval growth. Reproductive: Uterus and bilateral adnexa are unremarkable. Other: No abdominal wall hernia or abnormality. No abdominopelvic ascites. Musculoskeletal: Endplate degenerative changes are similar the prior exam. Moderate right foraminal stenosis is present at L2-3, L3-4 and L4-5. Moderate to  severe left foraminal stenosis is present at L1-2 and L2-3. IMPRESSION: 1. No acute or focal lesion to explain the patient's symptoms. 2. Descending and sigmoid diverticulosis without diverticulitis. 3. Cholelithiasis without evidence of cholecystitis. 4. Multilevel degenerative changes in the lumbar spine. 5. Continued interval growth of infrarenal abdominal aortic aneurysm, now measuring 5.3 x 4.9 cm. Recommend follow-up CT/MR every 6 months and vascular consultation. This recommendation follows ACR consensus guidelines: White Paper of the ACR Incidental Findings Committee II on Vascular Findings. J Am Coll Radiol 2013; 10:789-794. 6.  Aortic Atherosclerosis (ICD10-I70.0). Electronically Signed   By: Marin Roberts M.D.   On: 04/08/2023 16:34    Pending Labs Unresulted Labs (From admission, onward)     Start     Ordered   04/08/23 1721  Gastrointestinal Panel by PCR , Stool  (Gastrointestinal Panel by PCR, Stool                                                                                                                                                     **  Does Not include CLOSTRIDIUM DIFFICILE testing. **If CDIFF testing is needed, place order from the "C Difficile Testing" order set.**)  Once,   URGENT        04/08/23 1720   Signed and Held  Comprehensive metabolic panel  Tomorrow morning,   R        Signed and Held   Signed and Held  Magnesium  Tomorrow morning,   R        Signed and Held   Signed and Held  CBC with Differential/Platelet  Tomorrow morning,   R        Signed and Held            Vitals/Pain Today's Vitals   04/08/23 1749 04/08/23 1830 04/08/23 2015 04/08/23 2045  BP:  (!) 165/84 (!) 187/91 (!) 151/75  Pulse:  74 72 70  Resp:  18 (!) 25 (!) 25  Temp: (!) 97.4 F (36.3 C)     TempSrc: Oral     SpO2:  96% 97% 98%  Weight:      Height:      PainSc:        Isolation Precautions Enteric precautions (UV disinfection)  Medications Medications  lactated  ringers infusion ( Intravenous New Bag/Given 04/08/23 2026)  lactated ringers bolus 1,000 mL (0 mLs Intravenous Stopped 04/08/23 1949)  iohexol (OMNIPAQUE) 350 MG/ML injection 75 mL (75 mLs Intravenous Contrast Given 04/08/23 1604)  labetalol (NORMODYNE) injection 10 mg (10 mg Intravenous Given 04/08/23 2023)    Mobility walks with person assist     Focused Assessments     R Recommendations: See Admitting Provider Note  Report given to:   Additional Notes:

## 2023-04-08 NOTE — ED Provider Notes (Signed)
    ED Course / MDM   Clinical Course as of 04/08/23 2053  Fri Apr 08, 2023  1432 WBC: 6.0 No leukocytosis  [HN]  1433 Hemoglobin(!): 11.6 Stable from 3 years ago [HN]  1433 Creatinine: 1.23 Mild elevation from 1 [HN]  1433 Troponin I (High Sensitivity): 17 Neg initial trop [HN]  1433 Lipase: 23 wnl [HN]  1538 Received sign out from Dr. Jearld Fenton pending CT A/P and remainder of labs. Had likely syncope today, unwitnessed. Was having some diarrhea and lower abdominal pain. Didn't fall or hit head.  [WS]  2052 Workup unremarkable.  Fecal occult blood negative.  CT abdomen without acute process.  C. difficile test negative.  Given episode of prolonged syncope and age discussed with Dr. Imogene Burn who admit the patient for observation. [WS]    Clinical Course User Index [HN] Loetta Rough, MD [WS] Lonell Grandchild, MD   Medical Decision Making Amount and/or Complexity of Data Reviewed Labs: ordered. Decision-making details documented in ED Course. Radiology: ordered.  Risk Prescription drug management. Decision regarding hospitalization.          Lonell Grandchild, MD 04/08/23 2053

## 2023-04-08 NOTE — H&P (Signed)
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  He states that he woke up around 3 AM with watery diarrhea.  He had about 7 episodes today.  He had been taking p.o. doxycycline for a leg wound last week.  He stopped about 4 5 days ago.  No fevers.  Patient was walking with his walker.  He is planning on moving out of his home and into an senior independent living center.  He states that while he was walking, he felt very dizzy and passed out.  EMS was called.  On arrival temp 97.4 heart rate 65 blood pressure 123/64 satting 94% on room air.  White count 6.0, hemoglobin 0.6, platelets of 166  Magnesium 2.0  Sodium 136, potassium 3.7, bicarb 17, BUN 25, creatinine 1.23, glucose 102  Total protein 6.6, BUN 3.3, AST 43, ALT 24, alk phos of 45  COVID-negative Hemoccult negative C. difficile pending CT abdomen pelvis negative for colitis.  There is diverticulosis without evidence of diverticulitis.  Pt given IVF. C. Diff PCR pending  [facility] hospitalist consulted for admission.     ED Course: C. Diff pending. Scr 1.2, WBC 6, CT abd negative for colitis.  Review of Systems:  Review of Systems  Constitutional: Negative.   HENT: Negative.    Eyes: Negative.   Respiratory: Negative.    Cardiovascular:        Syncope while walking  Gastrointestinal:  Positive for diarrhea.  Genitourinary: Negative.   Musculoskeletal:  Positive for falls.  Skin: Negative.   Neurological: Negative.   Endo/Heme/Allergies: Negative.   Psychiatric/Behavioral:  Negative.    All other systems reviewed and are negative.   Past Medical History:  Diagnosis Date   AAA (abdominal aortic aneurysm) (HCC)    4.2 cm by CTA 10/01/13- states "much smaller than first expected"Dr. Valentina Lucks follows-checks every 2 yrs   Allergic asthma    Allergic rhinitis    Anxiety depression   Arthritis    torn rotator cuff left shoulder   Bruise    left chest area"recent fall"-no rib fracture identified.   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   Closed left hip fracture, initial encounter (HCC) 10/01/2019   Colitis    GERD (gastroesophageal reflux disease)    History of measles    History of mumps    HLD (hyperlipidemia)    HTN (hypertension)    Obesity    Polio    left leg   Pupil irregularity, right    old injury"pupil will not contract to light"   Shortness of breath    exertion, asthma attack    Past Surgical History:  Procedure Laterality Date   CARDIAC CATHETERIZATION     2006- 1 week prior tp CABPG   CATARACT EXTRACTION     COLONOSCOPY WITH PROPOFOL N/A 04/27/2016   Procedure: COLONOSCOPY WITH PROPOFOL;  Surgeon: Charolett Bumpers, MD;  Location: [facility];  Service: Endoscopy;  Laterality: N/A;   CORONARY ARTERY BYPASS GRAFT  3/06   x4 vessel bypass [facility]-No problems since.  EYE SURGERY     rebuild eye socket (R)   HIP PINNING,CANNULATED Left 10/01/2019   HIP PINNING,CANNULATED Left 10/01/2019   Procedure: CANNULATED HIP PINNING;  Surgeon: Roby Lofts, MD;  Location: [facility] OR;  Service: Orthopedics;  Laterality: Left;   KNEE ARTHROSCOPY Left    left knee scope   LUMBAR DISC SURGERY  2011   LUMBAR LAMINECTOMY/DECOMPRESSION MICRODISCECTOMY Left 11/07/2013   Procedure: LUMBAR LAMINECTOMY/DECOMPRESSION MICRODISCECTOMY 1 LEVEL lumbar two/three;  Surgeon: Tia Alert, MD;  Location: [facility];  Service: Neurosurgery;  Laterality: Left;   NASAL SINUS SURGERY     SHOULDER SURGERY       reports that he quit smoking about 30 years ago. His  smoking use included cigarettes. He started smoking about 67 years ago. He has a 37 pack-year smoking history. He has never used smokeless tobacco. He reports that he does not currently use alcohol. He reports that he does not use drugs.  Allergies  Allergen Reactions   Aspirin Hives   Codeine Hives    Other Reaction(s): whelps, Hydrocodone- ok, no problemHy   Fluticasone-Salmeterol Hives   Cephalexin Rash   Hydrochlorothiazide Rash   Ibuprofen Rash   Latex Rash   Montelukast Sodium Rash   Penicillins Rash   Statins Rash   Sulfonamide Derivatives Rash   Tape Rash    Other Reaction(s): rash and blisters    Family History  Problem Relation Age of Onset   Alzheimer's disease Mother    Liver disease Sister    Lung cancer Brother     Prior to Admission medications   Medication Sig Start Date End Date Taking? Authorizing Provider  aspirin EC 81 MG tablet Take 81 mg by mouth daily.    [provider]  budesonide-formoterol (SYMBICORT) 160-4.5 MCG/ACT inhaler 2 puffs Inhalation Twice a day 11/04/21   [provider]  Cholecalciferol (VITAMIN D-3) 125 MCG (5000 UT) TABS Take 5,000 Units by mouth daily. Patient not taking: Reported on 08/17/2022 10/03/19   [place] Bali, PA-C  doxycycline (VIBRAMYCIN) 100 MG capsule Take 1 capsule (100 mg total) by mouth 2 (two) times daily. Patient not taking: Reported on 10/27/2022 05/22/22   Particia Nearing, PA-C  DULERA 200-5 MCG/ACT AERO Inhale 2 puffs into the lungs 2 (two) times daily. Patient not taking: Reported on 08/17/2022 05/29/19   [provider]  escitalopram (LEXAPRO) 10 MG tablet Take 10 mg by mouth daily. 11/19/19   [provider]  ezetimibe (ZETIA) 10 MG tablet TAKE 1 TABLET BY MOUTH EVERY DAY 07/13/21   O'Neal, Ronnald Ramp, MD  fexofenadine (ALLEGRA) 180 MG tablet Take 180 mg by mouth daily as needed for allergies.     [provider]  furosemide (LASIX) 20 MG tablet Take 20 mg by  mouth as needed for fluid. 06/17/22   [provider]  lisinopril (PRINIVIL,ZESTRIL) 10 MG tablet Take 10 mg by mouth daily.     [provider]  LORazepam (ATIVAN) 1 MG tablet Take 1 mg by mouth at bedtime as needed. 11/19/19   [provider]  predniSONE (DELTASONE) 5 MG tablet Take 5 mg by mouth daily. 06/15/21   [provider]  PROAIR HFA 108 (90 Base) MCG/ACT inhaler Inhale 2 puffs into the lungs every 4 (four) hours as needed for wheezing.  09/19/19   [provider]  triamcinolone cream (KENALOG) 0.1 % Apply 1 application topically 2 (two) times daily. Patient not taking: Reported on 08/17/2022 03/08/21  Bing Neighbors, NP    Physical Exam: Vitals:   04/08/23 1338 04/08/23 1530 04/08/23 1749 04/08/23 1830  BP:  135/68  (!) 165/84  Pulse:  69  74  Resp:  18  18  Temp: (!) 97.4 F (36.3 C)  (!) 97.4 F (36.3 C)   TempSrc: Oral  Oral   SpO2:  94%  96%  Weight:      Height:        Physical Exam Vitals and nursing note reviewed.  Constitutional:      General: He is not in acute distress.    Appearance: He is not toxic-appearing or diaphoretic.  HENT:     Right Ear: Tympanic membrane normal.     Nose: Nose normal.  Eyes:     General: No scleral icterus. Cardiovascular:     Rate and Rhythm: Normal rate and regular rhythm.     Pulses: Normal pulses.  Pulmonary:     Effort: Pulmonary effort is normal.     Breath sounds: Normal breath sounds.  Abdominal:     General: Abdomen is flat. Bowel sounds are normal. There is no distension.     Palpations: Abdomen is soft.     Tenderness: There is no abdominal tenderness.  Musculoskeletal:     Comments: Left lower leg atrophy. Chronic per patient. Due to hx of polio as a child.  Skin:    General: Skin is warm and dry.     Capillary Refill: Capillary refill takes less than 2 seconds.  Neurological:     General: No focal deficit present.     Mental Status: He is alert and oriented  to person, place, and time.      Labs on Admission: I have personally reviewed following labs and imaging studies  CBC: Recent Labs  Lab 04/08/23 1345  WBC 6.0  NEUTROABS 4.4  HGB 11.6*  HCT 35.5*  MCV 94.9  PLT 166   Basic Metabolic Panel: Recent Labs  Lab 04/08/23 1345  NA 136  K 3.7  CL 109  CO2 17*  GLUCOSE 102*  BUN 25*  CREATININE 1.23  CALCIUM 7.9*  MG 2.0   GFR: Estimated Creatinine Clearance: 49 mL/min (by C-G formula based on SCr of 1.23 mg/dL). Liver Function Tests: Recent Labs  Lab 04/08/23 1345  AST 43*  ALT 24  ALKPHOS 45  BILITOT 0.6  PROT 6.6  ALBUMIN 3.3*   Recent Labs  Lab 04/08/23 1345  LIPASE 23   Cardiac Enzymes: Recent Labs  Lab 04/08/23 1345  TROPONINIHS 17   CBG: Recent Labs  Lab 04/08/23 1807  GLUCAP 81   Urine analysis:    Component Value Date/Time   COLORURINE YELLOW 04/08/2023 2016   APPEARANCEUR CLEAR 04/08/2023 2016   APPEARANCEUR Clear 06/17/2021 1354   LABSPEC >1.046 (H) 04/08/2023 2016   PHURINE 5.0 04/08/2023 2016   GLUCOSEU NEGATIVE 04/08/2023 2016   HGBUR SMALL (A) 04/08/2023 2016   BILIRUBINUR NEGATIVE 04/08/2023 2016   BILIRUBINUR Negative 06/17/2021 1354   KETONESUR 5 (A) 04/08/2023 2016   PROTEINUR NEGATIVE 04/08/2023 2016   UROBILINOGEN 0.2 03/01/2010 0757   NITRITE NEGATIVE 04/08/2023 2016   LEUKOCYTESUR NEGATIVE 04/08/2023 2016    Radiological Exams on Admission: I have personally reviewed images CT ABDOMEN PELVIS W CONTRAST  Result Date: 04/08/2023 CLINICAL DATA:  Left lower quadrant abdominal pain EXAM: CT ABDOMEN AND PELVIS WITH CONTRAST TECHNIQUE: Multidetector CT imaging of the abdomen and pelvis was performed using the standard protocol following bolus  administration of intravenous contrast. RADIATION DOSE REDUCTION: This exam was performed according to the departmental dose-optimization program which includes automated exposure control, adjustment of the mA and/or kV according to  patient size and/or use of iterative reconstruction technique. CONTRAST:  75mL OMNIPAQUE IOHEXOL 350 MG/ML SOLN COMPARISON:  CT angio of the abdomen and pelvis 12/26/2022 FINDINGS: Lower chest: The lung bases are clear without focal nodule, mass, or airspace disease. The heart size is normal. Coronary artery calcifications are present. No significant pleural or pericardial effusion is present. Hepatobiliary: Liver is unremarkable. Multiple layering gallstones are similar the prior study. No inflammatory changes are present in the gallbladder. The common bile duct is within normal limits. Pancreas: Unremarkable. No pancreatic ductal dilatation or surrounding inflammatory changes. Spleen: Normal in size without focal abnormality. Adrenals/Urinary Tract: Adrenal glands are unremarkable. Kidneys are normal, without renal calculi, focal lesion, or hydronephrosis. Bladder is unremarkable. Stomach/Bowel: The stomach and duodenum are within normal limits. The small bowel is unremarkable. The appendix is not discretely visualized and may be surgically absent. The ascending and transverse colon are within normal limits. Diverticular changes are present in the descending and sigmoid colon. Moderate redundancy of the sigmoid colon is noted. No focal inflammatory changes are present. Vascular/Lymphatic: Extensive atherosclerotic changes are present in the aorta and branch vessels. The infrarenal abdominal aortic aneurysm measures 5.3 x 4.9 cm, demonstrating continued interval growth. Reproductive: Uterus and bilateral adnexa are unremarkable. Other: No abdominal wall hernia or abnormality. No abdominopelvic ascites. Musculoskeletal: Endplate degenerative changes are similar the prior exam. Moderate right foraminal stenosis is present at L2-3, L3-4 and L4-5. Moderate to severe left foraminal stenosis is present at L1-2 and L2-3. IMPRESSION: 1. No acute or focal lesion to explain the patient's symptoms. 2. Descending and sigmoid  diverticulosis without diverticulitis. 3. Cholelithiasis without evidence of cholecystitis. 4. Multilevel degenerative changes in the lumbar spine. 5. Continued interval growth of infrarenal abdominal aortic aneurysm, now measuring 5.3 x 4.9 cm. Recommend follow-up CT/MR every 6 months and vascular consultation. This recommendation follows ACR consensus guidelines: White Paper of the ACR Incidental Findings Committee II on Vascular Findings. J Am Coll Radiol 2013; 10:789-794. 6.  Aortic Atherosclerosis (ICD10-I70.0). Electronically Signed   By: Marin Roberts M.D.   On: 04/08/2023 16:34    EKG: My personal interpretation of EKG shows: NSR    Assessment/Plan Principal Problem:   Syncope Active Problems:   Diarrhea   AKI (acute kidney injury) (HCC)   Essential hypertension   Hyperlipidemia   COPD mixed type (HCC)   Esophageal reflux   History of post-polio syndrome - left leg    Assessment and Plan: * Syncope Observation. Likely due to volume loss from acute diarrhea. C. Diff pending. Continue on IVF. Check orthostatics  Essential hypertension Hold lasix and lisinopril due to AKI. Prn labetalol.  AKI (acute kidney injury) (HCC) Due to volume loss from diarrhea. Continue with IVF. Repeat BMP in AM.  Diarrhea Pt stopped po doxycyline about 4 days ago. Developed watery diarrhea this AM around 3 AM. No fevers. No chills. No colitis on CT. C. Diff pending. Likely viral vs post-abx diarrhea.  History of post-polio syndrome - left leg Chronic. Pt uses walker. Has mild atrophy of left leg.  Esophageal reflux Stable.  COPD mixed type (HCC) Stable. Continue with inhalers.  Hyperlipidemia Stable. Continue with zetia.   DVT prophylaxis: SQ Heparin Code Status: Full Code Family Communication: no family at bedside  Disposition Plan: return home  Consults called: none  Admission  status: Observation, Telemetry bed   Carollee Herter, DO [facility] 04/08/2023, 8:39 PM

## 2023-04-08 NOTE — Assessment & Plan Note (Signed)
Chronic. Pt uses walker. Has mild atrophy of left leg.

## 2023-04-08 NOTE — ED Triage Notes (Addendum)
Pt BIB EMS from home where family found him unresponsive in his chair. Pt was diaphoretic, weak and lethargic. Incontinent of urine and stool when found. C/o left quadrant pain and watery diarrhea since this morning at 0300, gone at least 7 times. Family said pt was unresponsive for about 10 mins. Has cardiac hx and diagnosed Afib. Aox4.

## 2023-04-08 NOTE — Assessment & Plan Note (Signed)
Due to volume loss from diarrhea. Continue with IVF. Repeat BMP in AM.

## 2023-04-08 NOTE — Assessment & Plan Note (Signed)
Pt stopped po doxycyline about 4 days ago. Developed watery diarrhea this AM around 3 AM. No fevers. No chills. No colitis on CT. C. Diff pending. Likely viral vs post-abx diarrhea.

## 2023-04-08 NOTE — Assessment & Plan Note (Signed)
Stable. Continue with zetia.

## 2023-04-08 NOTE — Subjective & Objective (Addendum)
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  He states that he woke up around 3 AM with watery diarrhea.  He had about 7 episodes today.  He had been taking p.o. doxycycline for a leg wound last week.  He stopped about 4 5 days ago.  No fevers.  Patient was walking with his walker.  He is planning on moving out of his home and into an senior independent living center.  He states that while he was walking, he felt very dizzy and passed out.  EMS was called.  On arrival temp 97.4 heart rate 65 blood pressure 123/64 satting 94% on room air.  White count 6.0, hemoglobin 0.6, platelets of 166  Magnesium 2.0  Sodium 136, potassium 3.7, bicarb 17, BUN 25, creatinine 1.23, glucose 102  Total protein 6.6, BUN 3.3, AST 43, ALT 24, alk phos of 45  COVID-negative Hemoccult negative C. difficile pending CT abdomen pelvis negative for colitis.  There is diverticulosis without evidence of diverticulitis.  Pt given IVF. C. Diff PCR pending  [facility] hospitalist consulted for admission.

## 2023-04-08 NOTE — Assessment & Plan Note (Signed)
Stable

## 2023-04-08 NOTE — Assessment & Plan Note (Signed)
Hold lasix and lisinopril due to AKI. Prn labetalol.

## 2023-04-09 DIAGNOSIS — E785 Hyperlipidemia, unspecified: Secondary | ICD-10-CM | POA: Diagnosis present

## 2023-04-09 DIAGNOSIS — Z886 Allergy status to analgesic agent status: Secondary | ICD-10-CM | POA: Diagnosis not present

## 2023-04-09 DIAGNOSIS — M47816 Spondylosis without myelopathy or radiculopathy, lumbar region: Secondary | ICD-10-CM | POA: Diagnosis present

## 2023-04-09 DIAGNOSIS — G14 Postpolio syndrome: Secondary | ICD-10-CM | POA: Diagnosis present

## 2023-04-09 DIAGNOSIS — M1990 Unspecified osteoarthritis, unspecified site: Secondary | ICD-10-CM | POA: Diagnosis present

## 2023-04-09 DIAGNOSIS — K8020 Calculus of gallbladder without cholecystitis without obstruction: Secondary | ICD-10-CM | POA: Diagnosis present

## 2023-04-09 DIAGNOSIS — R197 Diarrhea, unspecified: Secondary | ICD-10-CM | POA: Diagnosis not present

## 2023-04-09 DIAGNOSIS — I7143 Infrarenal abdominal aortic aneurysm, without rupture: Secondary | ICD-10-CM | POA: Diagnosis present

## 2023-04-09 DIAGNOSIS — R55 Syncope and collapse: Secondary | ICD-10-CM | POA: Diagnosis not present

## 2023-04-09 DIAGNOSIS — K219 Gastro-esophageal reflux disease without esophagitis: Secondary | ICD-10-CM | POA: Diagnosis present

## 2023-04-09 DIAGNOSIS — R32 Unspecified urinary incontinence: Secondary | ICD-10-CM | POA: Diagnosis present

## 2023-04-09 DIAGNOSIS — F419 Anxiety disorder, unspecified: Secondary | ICD-10-CM | POA: Diagnosis present

## 2023-04-09 DIAGNOSIS — R159 Full incontinence of feces: Secondary | ICD-10-CM | POA: Diagnosis present

## 2023-04-09 DIAGNOSIS — I714 Abdominal aortic aneurysm, without rupture, unspecified: Secondary | ICD-10-CM | POA: Diagnosis present

## 2023-04-09 DIAGNOSIS — K5730 Diverticulosis of large intestine without perforation or abscess without bleeding: Secondary | ICD-10-CM | POA: Diagnosis present

## 2023-04-09 DIAGNOSIS — J309 Allergic rhinitis, unspecified: Secondary | ICD-10-CM | POA: Diagnosis present

## 2023-04-09 DIAGNOSIS — I10 Essential (primary) hypertension: Secondary | ICD-10-CM | POA: Diagnosis not present

## 2023-04-09 DIAGNOSIS — Z951 Presence of aortocoronary bypass graft: Secondary | ICD-10-CM | POA: Diagnosis not present

## 2023-04-09 DIAGNOSIS — R300 Dysuria: Secondary | ICD-10-CM | POA: Diagnosis present

## 2023-04-09 DIAGNOSIS — Z8619 Personal history of other infectious and parasitic diseases: Secondary | ICD-10-CM | POA: Diagnosis not present

## 2023-04-09 DIAGNOSIS — N179 Acute kidney failure, unspecified: Secondary | ICD-10-CM | POA: Diagnosis not present

## 2023-04-09 DIAGNOSIS — J4489 Other specified chronic obstructive pulmonary disease: Secondary | ICD-10-CM | POA: Diagnosis present

## 2023-04-09 DIAGNOSIS — Z1152 Encounter for screening for COVID-19: Secondary | ICD-10-CM | POA: Diagnosis not present

## 2023-04-09 DIAGNOSIS — F32A Depression, unspecified: Secondary | ICD-10-CM | POA: Diagnosis present

## 2023-04-09 DIAGNOSIS — I2510 Atherosclerotic heart disease of native coronary artery without angina pectoris: Secondary | ICD-10-CM | POA: Diagnosis present

## 2023-04-09 DIAGNOSIS — Z87891 Personal history of nicotine dependence: Secondary | ICD-10-CM | POA: Diagnosis not present

## 2023-04-09 LAB — GASTROINTESTINAL PANEL BY PCR, STOOL (REPLACES STOOL CULTURE)

## 2023-04-09 LAB — BASIC METABOLIC PANEL
Anion gap: 7 (ref 5–15)
BUN: 19 mg/dL (ref 8–23)
CO2: 22 mmol/L (ref 22–32)
Calcium: 8.4 mg/dL — ABNORMAL LOW (ref 8.9–10.3)
Chloride: 111 mmol/L (ref 98–111)
Creatinine, Ser: 1.25 mg/dL — ABNORMAL HIGH (ref 0.61–1.24)
GFR, Estimated: 57 mL/min — ABNORMAL LOW (ref >60)
Glucose, Bld: 96 mg/dL (ref 70–99)
Potassium: 3.5 mmol/L (ref 3.5–5.1)
Sodium: 140 mmol/L (ref 135–145)

## 2023-04-09 LAB — CBC WITH DIFFERENTIAL/PLATELET
Abs Immature Granulocytes: 0.02 10*3/uL (ref 0.00–0.07)
Basophils Absolute: 0 10*3/uL (ref 0.0–0.1)
Basophils Relative: 0 %
Eosinophils Absolute: 0 10*3/uL (ref 0.0–0.5)
Eosinophils Relative: 0 %
HCT: 35.4 % — ABNORMAL LOW (ref 39.0–52.0)
Hemoglobin: 11.8 g/dL — ABNORMAL LOW (ref 13.0–17.0)
Immature Granulocytes: 0 %
Lymphocytes Relative: 18 %
Lymphs Abs: 1.1 10*3/uL (ref 0.7–4.0)
MCH: 31.2 pg (ref 26.0–34.0)
MCHC: 33.3 g/dL (ref 30.0–36.0)
MCV: 93.7 fL (ref 80.0–100.0)
Monocytes Absolute: 0.6 10*3/uL (ref 0.1–1.0)
Monocytes Relative: 11 %
Neutro Abs: 4.2 10*3/uL (ref 1.7–7.7)
Neutrophils Relative %: 71 %
Platelets: 159 10*3/uL (ref 150–400)
RBC: 3.78 MIL/uL — ABNORMAL LOW (ref 4.22–5.81)
RDW: 14.5 % (ref 11.5–15.5)
WBC: 6 10*3/uL (ref 4.0–10.5)
nRBC: 0 % (ref 0.0–0.2)

## 2023-04-09 LAB — MAGNESIUM: Magnesium: 1.9 mg/dL (ref 1.7–2.4)

## 2023-04-09 MED ORDER — HYDRALAZINE HCL 20 MG/ML IJ SOLN
10.0000 mg | Freq: Four times a day (QID) | INTRAMUSCULAR | Status: DC | PRN
  Administered 2023-04-10: 10 mg via INTRAVENOUS
  Filled 2023-04-09: qty 1

## 2023-04-09 MED ORDER — LACTATED RINGERS IV SOLN: INTRAVENOUS | Status: AC

## 2023-04-09 MED ORDER — LOPERAMIDE HCL 2 MG PO CAPS
2.0000 mg | ORAL_CAPSULE | ORAL | Status: AC | PRN
  Administered 2023-04-09 – 2023-04-10 (×6): 2 mg via ORAL
  Filled 2023-04-09 (×7): qty 1

## 2023-04-09 NOTE — Progress Notes (Signed)
Progress Note   Patient: Thomas [name] ZOX:[id number] DOB: 1941-08-09 DOA: 04/08/2023     0 DOS: the patient was seen and examined on 04/09/2023   Brief hospital course: 82 year old male history of hypertension, postpolio syndrome, reflux, presents with syncope and acute diarrhea.  Has mild AKI.  On IV fluids. C. difficile negative    Assessment and Plan: Syncope secondary to volume loss from acute diarrhea: Denies any dizzy spells  Covid -ve C. Diff negative  Continue on IVF. F/u orthostatics  Essential hypertension Hold lasix and lisinopril due to AKI Give IV hydralazine 10mg  Q6hrs prn for SBP> 160   AKI (acute kidney injury): secondary to pre-renal volume loss Continue IVFs Recheck BMP this afternoon  Diarrhea: Likely viral vs post-abx diarrhea. Pt stopped po doxycyline about 4 days ago.   Covid -ve C. Diff negative - can remove the contact precautions  Reviewed CT abdomen and pelvis: No acute or focal lesion to explain the patient's symptoms. Descending and sigmoid diverticulosis without diverticulitis. Cholelithiasis without evidence of cholecystitis. Multilevel degenerative changes in the lumbar spine.  Continue on IVF.  Infrarenal abdominal aortic aneurysm: Noted on Ct abd and pelvis Continued interval growth of infrarenal abdominal aortic aneurysm, now measuring 5.3 x 4.9 cm.  Recommend follow-up CT/MR every 6 months and vascular consultation  History of post-polio syndrome - left leg Chronic. Pt uses walker. Has mild atrophy of left leg.  COPD mixed type: Continue with inhalers.  Hyperlipidemia: Continue with zetia     Subjective: Pt still reports he has loose stools  Reports abdominal soreness Denies fever, chills, nausea, vomiting, shortness of breath, chest pain, dizziness, headache  Physical Exam: Vitals:   04/09/23 0500 04/09/23 0729 04/09/23 0821 04/09/23 1229  BP:  123/67  (!) 164/67  Pulse:  (!) 59  60  Resp:  17  16  Temp:  (!) 97.4 F (36.3  C)  (!) 97.5 F (36.4 C)  TempSrc:  Oral  Oral  SpO2:  96% 96% 97%  Weight: 85.2 kg     Height:       Physical Exam Constitutional:      Appearance: Normal appearance. He is normal weight. He is ill-appearing.  HENT:     Head: Normocephalic and atraumatic.     Nose: Nose normal. No congestion.     Mouth/Throat:     Mouth: Mucous membranes are dry.     Pharynx: Oropharynx is clear. No oropharyngeal exudate.  Eyes:     Extraocular Movements: Extraocular movements intact.     Conjunctiva/sclera: Conjunctivae normal.     Pupils: Pupils are equal, round, and reactive to light.  Cardiovascular:     Rate and Rhythm: Normal rate and regular rhythm.  Pulmonary:     Effort: Pulmonary effort is normal.     Breath sounds: Normal breath sounds.  Abdominal:     General: Abdomen is flat. Bowel sounds are normal.     Palpations: Abdomen is soft.     Tenderness: There is no abdominal tenderness.  Musculoskeletal:        General: No swelling or tenderness. Normal range of motion.     Cervical back: Normal range of motion and neck supple.     Right lower leg: No edema.     Left lower leg: No edema.  Skin:    General: Skin is dry.     Capillary Refill: Capillary refill takes 2 to 3 seconds.     Findings: Erythema present.  Neurological:  General: No focal deficit present.     Mental Status: He is alert and oriented to person, place, and time.     Cranial Nerves: No cranial nerve deficit.     Sensory: No sensory deficit.  Psychiatric:        Mood and Affect: Mood normal.        Behavior: Behavior normal.      Data Reviewed:  Latest Reference Range & Units 04/09/23 06:25  Magnesium 1.7 - 2.4 mg/dL 1.9  WBC 4.0 - 52.8 K/uL 6.0  RBC 4.22 - 5.81 MIL/uL 3.78 (L)  Hemoglobin 13.0 - 17.0 g/dL 41.3 (L)  HCT 24.4 - 01.0 % 35.4 (L)  MCV 80.0 - 100.0 fL 93.7  MCH 26.0 - 34.0 pg 31.2  MCHC 30.0 - 36.0 g/dL 27.2  RDW 53.6 - 64.4 % 14.5  Platelets 150 - 400 K/uL 159  nRBC 0.0 - 0.2 %  0.0  (L): Data is abnormally low  Family Communication:   Disposition: Status is: Observation   Planned Discharge Destination: Home    Time spent: 35 minutes  Author: Ernestene Mention, MD 04/09/2023 2:08 PM  For on call review [email or web address].

## 2023-04-09 NOTE — Plan of Care (Signed)

## 2023-04-10 LAB — BASIC METABOLIC PANEL
Anion gap: 8 (ref 5–15)
BUN: 13 mg/dL (ref 8–23)
CO2: 20 mmol/L — ABNORMAL LOW (ref 22–32)
Calcium: 8 mg/dL — ABNORMAL LOW (ref 8.9–10.3)
Chloride: 109 mmol/L (ref 98–111)
Creatinine, Ser: 1.02 mg/dL (ref 0.61–1.24)
GFR, Estimated: >60 mL/min (ref >60)
Glucose, Bld: 98 mg/dL (ref 70–99)
Potassium: 4 mmol/L (ref 3.5–5.1)
Sodium: 137 mmol/L (ref 135–145)

## 2023-04-10 LAB — MAGNESIUM: Magnesium: 1.9 mg/dL (ref 1.7–2.4)

## 2023-04-10 MED ORDER — AMLODIPINE BESYLATE 5 MG PO TABS: 5.0000 mg | ORAL_TABLET | Freq: Every day | ORAL | Status: DC

## 2023-04-10 NOTE — Plan of Care (Signed)

## 2023-04-10 NOTE — Progress Notes (Addendum)
Progress Note   Patient: Thomas [name] ZOX:[id number] DOB: 05/27/1941 DOA: 04/08/2023     1 DOS: the patient was seen and examined on 04/10/2023   Brief hospital course: 82 year old male history of hypertension, postpolio syndrome, reflux, presents with syncope and acute diarrhea.  Has mild AKI.  On IV fluids. C. difficile negative    Assessment and Plan:  Diarrhea: Likely viral vs post-abx diarrhea: Reported 4 loose watery stools since last night Pt stopped po doxycyline about 4 days ago.   Covid -ve C. Diff negative - can remove the contact precautions  Reviewed CT abdomen and pelvis: No acute or focal lesion to explain the patient's symptoms. Descending and sigmoid diverticulosis without diverticulitis. Cholelithiasis without evidence of cholecystitis. Multilevel degenerative changes in the lumbar spine.  Continue on IVF. Give imodium Q4hrs prn  AKI (acute kidney injury): secondary to pre-renal volume loss Continue IVFs Recheck BMP  Essential hypertension:  Held lasix and lisinopril due to AKI, can resume at discharge once renal function improves Give IV hydralazine 10mg  Q6hrs prn for SBP> 160   Syncope secondary to volume loss from acute diarrhea: Denies any dizzy spells  Covid -ve C. Diff negative  Continue on IVF. F/u orthostatics  Infrarenal abdominal aortic aneurysm: Noted on Ct abd and pelvis Continued interval growth of infrarenal abdominal aortic aneurysm, now measuring 5.3 x 4.9 cm.  Recommend follow-up CT/MR every 6 months and vascular consultation  History of post-polio syndrome - left leg Chronic. Pt uses walker. Has mild atrophy of left leg.  COPD mixed type: Continue with inhalers.  Hyperlipidemia: Continue with zetia     Subjective:  Reported 4 loose watery stools since last night Reports abdominal soreness Denies fever, chills, nausea, vomiting, shortness of breath, chest pain, dizziness, headache  Physical Exam: Vitals:   04/10/23 0042  04/10/23 0452 04/10/23 0500 04/10/23 0802  BP: (!) 169/74 (!) 156/79  (!) 165/74  Pulse: 64 70  65  Resp: 15 17  17   Temp: 98.6 F (37 C) 97.6 F (36.4 C)  (!) 97.5 F (36.4 C)  TempSrc: Oral Oral  Oral  SpO2: 97% 98%  98%  Weight:   85.3 kg   Height:       Physical Exam Constitutional:      Appearance: Normal appearance. He is normal weight. He is ill-appearing.  HENT:     Head: Normocephalic and atraumatic.     Nose: Nose normal. No congestion.     Mouth/Throat:     Mouth: Mucous membranes are dry.     Pharynx: Oropharynx is clear. No oropharyngeal exudate.  Eyes:     Extraocular Movements: Extraocular movements intact.     Conjunctiva/sclera: Conjunctivae normal.     Pupils: Pupils are equal, round, and reactive to light.  Cardiovascular:     Rate and Rhythm: Normal rate and regular rhythm.  Pulmonary:     Effort: Pulmonary effort is normal.     Breath sounds: Normal breath sounds.  Abdominal:     General: Abdomen is flat. Bowel sounds are normal.     Palpations: Abdomen is soft.     Tenderness: There is no abdominal tenderness.  Musculoskeletal:        General: No swelling or tenderness. Normal range of motion.     Cervical back: Normal range of motion and neck supple.     Right lower leg: No edema.     Left lower leg: No edema.  Skin:    General: Skin is dry.  Capillary Refill: Capillary refill takes 2 to 3 seconds.     Findings: Erythema present.  Neurological:     General: No focal deficit present.     Mental Status: He is alert and oriented to person, place, and time.     Cranial Nerves: No cranial nerve deficit.     Sensory: No sensory deficit.  Psychiatric:        Mood and Affect: Mood normal.        Behavior: Behavior normal.      Data Reviewed:  Latest Reference Range & Units 04/09/23 06:25  Magnesium 1.7 - 2.4 mg/dL 1.9  WBC 4.0 - 72.5 K/uL 6.0  RBC 4.22 - 5.81 MIL/uL 3.78 (L)  Hemoglobin 13.0 - 17.0 g/dL 36.6 (L)  HCT 44.0 - 34.7 % 35.4  (L)  MCV 80.0 - 100.0 fL 93.7  MCH 26.0 - 34.0 pg 31.2  MCHC 30.0 - 36.0 g/dL 42.5  RDW 95.6 - 38.7 % 14.5  Platelets 150 - 400 K/uL 159  nRBC 0.0 - 0.2 % 0.0  (L): Data is abnormally low  Family Communication:   Disposition: Status is: Changed from Observation to inpatient   Planned Discharge Destination: Home    Time spent: 35 minutes  Author: Ernestene Mention, MD 04/10/2023 8:39 AM  For on call review [email or web address].

## 2023-04-11 DIAGNOSIS — I10 Essential (primary) hypertension: Secondary | ICD-10-CM | POA: Diagnosis not present

## 2023-04-11 DIAGNOSIS — R197 Diarrhea, unspecified: Secondary | ICD-10-CM | POA: Diagnosis not present

## 2023-04-11 DIAGNOSIS — N179 Acute kidney failure, unspecified: Secondary | ICD-10-CM | POA: Diagnosis not present

## 2023-04-11 DIAGNOSIS — R55 Syncope and collapse: Secondary | ICD-10-CM | POA: Diagnosis not present

## 2023-04-11 LAB — BASIC METABOLIC PANEL
Anion gap: 7 (ref 5–15)
BUN: 9 mg/dL (ref 8–23)
CO2: 21 mmol/L — ABNORMAL LOW (ref 22–32)
Calcium: 7.9 mg/dL — ABNORMAL LOW (ref 8.9–10.3)
Chloride: 109 mmol/L (ref 98–111)
Creatinine, Ser: 0.88 mg/dL (ref 0.61–1.24)
GFR, Estimated: >60 mL/min (ref >60)
Glucose, Bld: 100 mg/dL — ABNORMAL HIGH (ref 70–99)
Potassium: 3.5 mmol/L (ref 3.5–5.1)
Sodium: 137 mmol/L (ref 135–145)

## 2023-04-11 MED ORDER — LISINOPRIL 20 MG PO TABS
20.0000 mg | ORAL_TABLET | Freq: Two times a day (BID) | ORAL | Status: DC
  Administered 2023-04-11: 20 mg via ORAL
  Filled 2023-04-11: qty 1

## 2023-04-11 MED ORDER — LOPERAMIDE HCL 2 MG PO CAPS: 2.0000 mg | ORAL_CAPSULE | ORAL | 0 refills | Status: AC | PRN

## 2023-04-11 NOTE — Progress Notes (Signed)
Transition of Care [facility]) - Inpatient Brief Assessment   Patient Details  Name: Thomas [name] MRN: [id number] Date of Birth: 04/13/1941  Transition of Care [facility]) CM/SW Contact:    Janae Bridgeman, RN Phone Number: 04/11/2023, 12:50 PM   Clinical Narrative: Patient admitted to the hospital for syncopal episode.  No TOC needs at this time.  Patient is discharging home today.   Transition of Care Asessment: Insurance and Status: (P) Insurance coverage has been reviewed Patient has primary care physician: (P) Yes Home environment has been reviewed: (P) Yes - from home - IL[facility] Prior level of function:: (P) Independent Prior/Current Home Services: (P) No current home services Social Determinants of Health Reivew: (P) SDOH reviewed no interventions necessary Readmission risk has been reviewed: (P) Yes Transition of care needs: (P) no transition of care needs at this time

## 2023-04-11 NOTE — Plan of Care (Signed)

## 2023-04-11 NOTE — Discharge Summary (Signed)
Physician Discharge Summary   Patient: Thomas [name] MRN: [id number] DOB: 03/16/1941  Admit date:     04/08/2023  Discharge date: 04/11/23  Discharge Physician: Arnetha Courser   PCP: Emilio Aspen, MD   Recommendations at discharge:  Please obtain CBC and BMP in 1 week Follow-up with primary care provider within a week  Discharge Diagnoses: Principal Problem:   Syncope Active Problems:   Diarrhea   AKI (acute kidney injury) (HCC)   Essential hypertension   Hyperlipidemia   COPD mixed type (HCC)   Esophageal reflux   History of post-polio syndrome - left leg   Hospital Course: 82 year old male history of hypertension, postpolio syndrome, reflux, presents with syncope and acute diarrhea.  C. difficile was negative, likely viral versus postantibiotic diarrhea as he received recent antibiotics.  Diarrhea improved with supportive care.  Patient can use Imodium as needed and advised to keep himself well-hydrated.CT abdomen and pelvis: No acute or focal lesion to explain the patient's symptoms. Descending and sigmoid diverticulosis without diverticulitis. Cholelithiasis without evidence of cholecystitis. Multilevel degenerative changes in the lumbar spine.   Patient likely had syncope secondary to volume loss from acute diarrhea.  COVID PCR negative, C. difficile negative.  Symptoms improved with IV fluid.  Orthostatic vitals negative.  Patient had mild AKI likely secondary to prerenal volume loss with acute diarrhea.  Renal function has been normalized.  Patient can resume his home lisinopril.  Also noted to have infrarenal abdominal aortic aneurysm on CT abdomen, measuring 5.3 x 4.9 cm.  Recommend follow-up CT/MR every 6 months and vascular consultation.  Patient will continue with rest of his home medications and need to have a close follow-up with his providers for further recommendations.  Consultants: None Procedures performed: None Disposition: Home Diet  recommendation:  Discharge Diet Orders (From admission, onward)     Start     Ordered   04/11/23 0000  Diet - low sodium heart healthy        04/11/23 1154           Cardiac diet DISCHARGE MEDICATION: Allergies as of 04/11/2023       Reactions   Aspirin Hives   Codeine Hives   Other Reaction(s): whelps, Hydrocodone- ok, no problemHy   Fluticasone-salmeterol Hives   Cephalexin Rash   Hydrochlorothiazide Rash   Ibuprofen Rash   Latex Rash   Montelukast Sodium Rash   Penicillins Rash   Statins Rash   Sulfonamide Derivatives Rash   Tape Rash   Other Reaction(s): rash and blisters        Medication List     STOP taking these medications    doxycycline 100 MG tablet Commonly known as: VIBRA-TABS   Dulera 200-5 MCG/ACT Aero Generic drug: mometasone-formoterol   triamcinolone cream 0.1 % Commonly known as: KENALOG       TAKE these medications    aspirin EC 81 MG tablet Take 81 mg by mouth daily.   escitalopram 10 MG tablet Commonly known as: LEXAPRO Take 10 mg by mouth daily.   ezetimibe 10 MG tablet Commonly known as: ZETIA TAKE 1 TABLET BY MOUTH EVERY DAY   fexofenadine 180 MG tablet Commonly known as: ALLEGRA Take 180 mg by mouth daily as needed for allergies.   furosemide 20 MG tablet Commonly known as: LASIX Take 20 mg by mouth as needed for fluid.   lisinopril 20 MG tablet Commonly known as: ZESTRIL Take 20 mg by mouth 2 (two) times daily. What changed: Another medication with  the same name was removed. Continue taking this medication, and follow the directions you see here.   loperamide 2 MG capsule Commonly known as: IMODIUM Take 1 capsule (2 mg total) by mouth every 4 (four) hours as needed for diarrhea or loose stools.   LORazepam 1 MG tablet Commonly known as: ATIVAN Take 1 mg by mouth at bedtime as needed.   mupirocin ointment 2 % Commonly known as: BACTROBAN Apply 1 Application topically 2 (two) times daily.   predniSONE 5  MG tablet Commonly known as: DELTASONE Take 5 mg by mouth daily.   ProAir HFA 108 (90 Base) MCG/ACT inhaler Generic drug: albuterol Inhale 2 puffs into the lungs every 4 (four) hours as needed for wheezing.   Symbicort 160-4.5 MCG/ACT inhaler Generic drug: budesonide-formoterol 2 puffs Inhalation Twice a day   Vitamin D-3 125 MCG (5000 UT) Tabs Take 5,000 Units by mouth daily.        Follow-up Information     Emilio Aspen, MD. Schedule an appointment as soon as possible for a visit in 1 week(s).   Specialty: Internal Medicine Contact information: [address] [place] [address] [phone]                Discharge Exam: Filed Weights   04/09/23 0500 04/10/23 0500 04/11/23 0500  Weight: 85.2 kg 85.3 kg 86 kg   General.  Well-developed elderly man, in no acute distress. Pulmonary.  Lungs clear bilaterally, normal respiratory effort. CV.  Regular rate and rhythm, no JVD, rub or murmur. Abdomen.  Soft, nontender, nondistended, BS positive. CNS.  Alert and oriented .  No focal neurologic deficit. Extremities.  No edema, no cyanosis, pulses intact and symmetrical. Psychiatry.  Judgment and insight appears normal.   Condition at discharge: stable  The results of significant diagnostics from this hospitalization (including imaging, microbiology, ancillary and laboratory) are listed below for reference.   Imaging Studies: CT ABDOMEN PELVIS W CONTRAST  Result Date: 04/08/2023 CLINICAL DATA:  Left lower quadrant abdominal pain EXAM: CT ABDOMEN AND PELVIS WITH CONTRAST TECHNIQUE: Multidetector CT imaging of the abdomen and pelvis was performed using the standard protocol following bolus administration of intravenous contrast. RADIATION DOSE REDUCTION: This exam was performed according to the departmental dose-optimization program which includes automated exposure control, adjustment of the mA and/or kV according to patient size and/or use of  iterative reconstruction technique. CONTRAST:  75mL OMNIPAQUE IOHEXOL 350 MG/ML SOLN COMPARISON:  CT angio of the abdomen and pelvis 12/26/2022 FINDINGS: Lower chest: The lung bases are clear without focal nodule, mass, or airspace disease. The heart size is normal. Coronary artery calcifications are present. No significant pleural or pericardial effusion is present. Hepatobiliary: Liver is unremarkable. Multiple layering gallstones are similar the prior study. No inflammatory changes are present in the gallbladder. The common bile duct is within normal limits. Pancreas: Unremarkable. No pancreatic ductal dilatation or surrounding inflammatory changes. Spleen: Normal in size without focal abnormality. Adrenals/Urinary Tract: Adrenal glands are unremarkable. Kidneys are normal, without renal calculi, focal lesion, or hydronephrosis. Bladder is unremarkable. Stomach/Bowel: The stomach and duodenum are within normal limits. The small bowel is unremarkable. The appendix is not discretely visualized and may be surgically absent. The ascending and transverse colon are within normal limits. Diverticular changes are present in the descending and sigmoid colon. Moderate redundancy of the sigmoid colon is noted. No focal inflammatory changes are present. Vascular/Lymphatic: Extensive atherosclerotic changes are present in the aorta and branch vessels. The infrarenal abdominal  aortic aneurysm measures 5.3 x 4.9 cm, demonstrating continued interval growth. Reproductive: Uterus and bilateral adnexa are unremarkable. Other: No abdominal wall hernia or abnormality. No abdominopelvic ascites. Musculoskeletal: Endplate degenerative changes are similar the prior exam. Moderate right foraminal stenosis is present at L2-3, L3-4 and L4-5. Moderate to severe left foraminal stenosis is present at L1-2 and L2-3. IMPRESSION: 1. No acute or focal lesion to explain the patient's symptoms. 2. Descending and sigmoid diverticulosis without  diverticulitis. 3. Cholelithiasis without evidence of cholecystitis. 4. Multilevel degenerative changes in the lumbar spine. 5. Continued interval growth of infrarenal abdominal aortic aneurysm, now measuring 5.3 x 4.9 cm. Recommend follow-up CT/MR every 6 months and vascular consultation. This recommendation follows ACR consensus guidelines: White Paper of the ACR Incidental Findings Committee II on Vascular Findings. J Am Coll Radiol 2013; 10:789-794. 6.  Aortic Atherosclerosis (ICD10-I70.0). Electronically Signed   By: Marin Roberts M.D.   On: 04/08/2023 16:34    Microbiology: Results for orders placed or performed during the hospital encounter of 04/08/23  SARS Coronavirus 2 by RT PCR (hospital order, performed in [facility] hospital lab) *cepheid single result test* Anterior Nasal Swab     Status: None   Collection Time: 04/08/23  1:45 PM   Specimen: Anterior Nasal Swab  Result Value Ref Range Status   SARS Coronavirus 2 by RT PCR NEGATIVE NEGATIVE Final    Comment: Performed at [facility], [address], [place], [place] [address]  C Difficile Quick Screen w PCR reflex     Status: None   Collection Time: 04/08/23  5:21 PM   Specimen: STOOL  Result Value Ref Range Status   C Diff antigen NEGATIVE NEGATIVE Final   C Diff toxin NEGATIVE NEGATIVE Final   C Diff interpretation No C. difficile detected.  Final    Comment: Performed at [facility], [address] [place], [place] [address]  Gastrointestinal Panel by PCR , Stool     Status: Abnormal   Collection Time: 04/08/23  5:21 PM   Specimen: STOOL  Result Value Ref Range Status   Campylobacter species NOT DETECTED NOT DETECTED Final   Plesimonas shigelloides NOT DETECTED NOT DETECTED Final   Salmonella species NOT DETECTED NOT DETECTED Final   Yersinia enterocolitica NOT DETECTED NOT DETECTED Final   Vibrio species NOT DETECTED NOT DETECTED Final   Vibrio cholerae NOT DETECTED NOT DETECTED Final    Enteroaggregative E coli (EAEC) NOT DETECTED NOT DETECTED Final   Enteropathogenic E coli (EPEC) NOT DETECTED NOT DETECTED Final   Enterotoxigenic E coli (ETEC) NOT DETECTED NOT DETECTED Final   Shiga like toxin producing E coli (STEC) NOT DETECTED NOT DETECTED Final   Shigella/Enteroinvasive E coli (EIEC) NOT DETECTED NOT DETECTED Final   Cryptosporidium NOT DETECTED NOT DETECTED Final   Cyclospora cayetanensis NOT DETECTED NOT DETECTED Final   Entamoeba histolytica NOT DETECTED NOT DETECTED Final   Giardia lamblia NOT DETECTED NOT DETECTED Final   Adenovirus F40/41 DETECTED (A) NOT DETECTED Final   Astrovirus NOT DETECTED NOT DETECTED Final   Norovirus GI/GII NOT DETECTED NOT DETECTED Final   Rotavirus A NOT DETECTED NOT DETECTED Final   Sapovirus (I, II, IV, and V) NOT DETECTED NOT DETECTED Final    Comment: Performed at [facility], [address] [place], [place] [address]    Labs: CBC: Recent Labs  Lab 04/08/23 1345 04/09/23 0625  WBC 6.0 6.0  NEUTROABS 4.4 4.2  HGB 11.6* 11.8*  HCT 35.5*  35.4*  MCV 94.9 93.7  PLT 166 159   Basic Metabolic Panel: Recent Labs  Lab 04/08/23 1345 04/09/23 0625 04/09/23 1424 04/10/23 1007 04/11/23 0319  NA 136  --  140 137 137  K 3.7  --  3.5 4.0 3.5  CL 109  --  111 109 109  CO2 17*  --  22 20* 21*  GLUCOSE 102*  --  96 98 100*  BUN 25*  --  19 13 9   CREATININE 1.23  --  1.25* 1.02 0.88  CALCIUM 7.9*  --  8.4* 8.0* 7.9*  MG 2.0 1.9  --  1.9  --    Liver Function Tests: Recent Labs  Lab 04/08/23 1345  AST 43*  ALT 24  ALKPHOS 45  BILITOT 0.6  PROT 6.6  ALBUMIN 3.3*   CBG: Recent Labs  Lab 04/08/23 1807  GLUCAP 81    Discharge time spent: greater than 30 minutes.  This record has been created using Conservation officer, historic buildings. Errors have been sought and corrected,but may not always be located. Such creation errors do not reflect on the standard of care.   Signed: Arnetha Courser, MD [facility] 04/11/2023

## 2023-04-18 ENCOUNTER — Other Ambulatory Visit: Payer: Self-pay

## 2023-04-18 DIAGNOSIS — I714 Abdominal aortic aneurysm, without rupture, unspecified: Secondary | ICD-10-CM

## 2023-04-28 DIAGNOSIS — M79671 Pain in right foot: Secondary | ICD-10-CM | POA: Diagnosis not present

## 2023-04-28 DIAGNOSIS — I739 Peripheral vascular disease, unspecified: Secondary | ICD-10-CM | POA: Diagnosis not present

## 2023-04-28 DIAGNOSIS — M79672 Pain in left foot: Secondary | ICD-10-CM | POA: Diagnosis not present

## 2023-04-28 DIAGNOSIS — L110 Acquired keratosis follicularis: Secondary | ICD-10-CM | POA: Diagnosis not present

## 2023-05-02 ENCOUNTER — Encounter: Payer: Self-pay | Admitting: Surgery

## 2023-05-02 ENCOUNTER — Other Ambulatory Visit (HOSPITAL_COMMUNITY): Payer: Self-pay | Admitting: Surgery

## 2023-05-02 ENCOUNTER — Ambulatory Visit: Payer: Medicare Other | Admitting: Surgery

## 2023-05-02 ENCOUNTER — Ambulatory Visit (INDEPENDENT_AMBULATORY_CARE_PROVIDER_SITE_OTHER)
Admission: RE | Admit: 2023-05-02 | Discharge: 2023-05-02 | Disposition: A | Discharge status: Home / Self Care | Payer: Medicare Other | Source: Ambulatory Visit | Attending: Surgery | Admitting: Surgery

## 2023-05-02 ENCOUNTER — Ambulatory Visit (HOSPITAL_COMMUNITY)
Admission: RE | Admit: 2023-05-02 | Discharge: 2023-05-02 | Disposition: A | Discharge status: Home / Self Care | Payer: Medicare Other | Source: Ambulatory Visit | Attending: Surgery | Admitting: Surgery

## 2023-05-02 VITALS — BP 166/84 | HR 58 | Temp 98.0°F | Resp 20 | Ht 68.0 in | Wt 187.5 lb

## 2023-05-02 DIAGNOSIS — I714 Abdominal aortic aneurysm, without rupture, unspecified: Secondary | ICD-10-CM | POA: Diagnosis not present

## 2023-05-02 DIAGNOSIS — Z01818 Encounter for other preprocedural examination: Secondary | ICD-10-CM | POA: Diagnosis not present

## 2023-05-02 DIAGNOSIS — I7143 Infrarenal abdominal aortic aneurysm, without rupture: Secondary | ICD-10-CM | POA: Diagnosis not present

## 2023-05-02 NOTE — H&P (View-Only) (Signed)
[facility]  Patient name: Thomas [name] MRN: [id number] DOB: [birth year] Sex: male   REASON FOR VISIT:    Follow up  HISOTRY OF PRESENT ILLNESS:    Thomas [name] is a 82 y.o. male, former patient of Dr. Arbie Cookey, who has been following him for an abdominal aortic aneurysm.  He was last seen in March 2024 at which time his aneurysm measured 5.25 cm.  He is back today for follow-up.  He denies any abdominal pain.  The patient is a former smoker.  He is medically managed for hypertension.  History of coronary artery disease, status post CABG in 2006   PAST MEDICAL HISTORY:   Past Medical History:  Diagnosis Date   AAA (abdominal aortic aneurysm) (HCC)    4.2 cm by CTA 10/01/13- states "much smaller than first expected"Dr. Valentina Lucks follows-checks every 2 yrs   Allergic asthma    Allergic rhinitis    Anxiety depression   Arthritis    torn rotator cuff left shoulder   Bruise    left chest area"recent fall"-no rib fracture identified.   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   Closed left hip fracture, initial encounter (HCC) 10/01/2019   Colitis    GERD (gastroesophageal reflux disease)    History of measles    History of mumps    HLD (hyperlipidemia)    HTN (hypertension)    Obesity    Polio    left leg   Pupil irregularity, right    old injury"pupil will not contract to light"   Shortness of breath    exertion, asthma attack     FAMILY HISTORY:   Family History  Problem Relation Age of Onset   Alzheimer's disease Mother    Liver disease Sister    Lung cancer Brother     SOCIAL HISTORY:   Social History   Tobacco Use   Smoking status: Former    Current packs/day: 0.00    Average packs/day: 1 pack/day for 37.0 years (37.0 ttl pk-yrs)    Types: Cigarettes    Start date: 08/24/1955    Quit date: 08/23/1992    Years since quitting: 30.7   Smokeless tobacco: Never  Substance Use  Topics   Alcohol use: Not Currently    Comment: socially      ALLERGIES:   Allergies  Allergen Reactions   Aspirin Hives   Codeine Hives    Other Reaction(s): whelps, Hydrocodone- ok, no problemHy   Fluticasone-Salmeterol Hives   Cephalexin Rash   Hydrochlorothiazide Rash   Ibuprofen Rash   Latex Rash   Montelukast Sodium Rash   Penicillins Rash   Statins Rash   Sulfonamide Derivatives Rash   Tape Rash    Other Reaction(s): rash and blisters     CURRENT MEDICATIONS:   Current Outpatient Medications  Medication Sig Dispense Refill   aspirin EC 81 MG tablet Take 81 mg by mouth daily.     budesonide-formoterol (SYMBICORT) 160-4.5 MCG/ACT inhaler 2 puffs Inhalation Twice a day     Cholecalciferol (VITAMIN D-3) 125 MCG (5000 UT) TABS Take 5,000 Units by mouth daily. (Patient not taking: Reported on 08/17/2022) 90 tablet 1   escitalopram (LEXAPRO) 10 MG tablet Take 10 mg by mouth daily.     ezetimibe (ZETIA) 10 MG tablet TAKE 1 TABLET BY MOUTH EVERY DAY 90 tablet 3   fexofenadine (ALLEGRA) 180 MG tablet Take 180 mg by mouth daily as needed for allergies.  furosemide (LASIX) 20 MG tablet Take 20 mg by mouth as needed for fluid.     lisinopril (ZESTRIL) 20 MG tablet Take 20 mg by mouth 2 (two) times daily.     loperamide (IMODIUM) 2 MG capsule Take 1 capsule (2 mg total) by mouth every 4 (four) hours as needed for diarrhea or loose stools. 30 capsule 0   LORazepam (ATIVAN) 1 MG tablet Take 1 mg by mouth at bedtime as needed.     mupirocin ointment (BACTROBAN) 2 % Apply 1 Application topically 2 (two) times daily.     predniSONE (DELTASONE) 5 MG tablet Take 5 mg by mouth daily.     PROAIR HFA 108 (90 Base) MCG/ACT inhaler Inhale 2 puffs into the lungs every 4 (four) hours as needed for wheezing.      No current facility-administered medications for this visit.    REVIEW OF SYSTEMS:   [X]  denotes positive finding, [ ]  denotes negative finding Cardiac  Comments:  Chest  pain or chest pressure:    Shortness of breath upon exertion:    Short of breath when lying flat:    Irregular heart rhythm:        Vascular    Pain in calf, thigh, or hip brought on by ambulation:    Pain in feet at night that wakes you up from your sleep:     Blood clot in your veins:    Leg swelling:         Pulmonary    Oxygen at home:    Productive cough:     Wheezing:         Neurologic    Sudden weakness in arms or legs:     Sudden numbness in arms or legs:     Sudden onset of difficulty speaking or slurred speech:    Temporary loss of vision in one eye:     Problems with dizziness:         Gastrointestinal    Blood in stool:     Vomited blood:         Genitourinary    Burning when urinating:     Blood in urine:        Psychiatric    Major depression:         Hematologic    Bleeding problems:    Problems with blood clotting too easily:        Skin    Rashes or ulcers:        Constitutional    Fever or chills:      PHYSICAL EXAM:   There were no vitals filed for this visit.  GENERAL: The patient is a well-nourished male, in no acute distress. The vital signs are documented above. CARDIAC: There is a regular rate and rhythm.  VASCULAR: Palpable pedal pulses PULMONARY: Non-labored respirations ABDOMEN: Soft and non-tender  MUSCULOSKELETAL: There are no major deformities or cyanosis. NEUROLOGIC: No focal weakness or paresthesias are detected. SKIN: There are no ulcers or rashes noted. PSYCHIATRIC: The patient has a normal affect.  STUDIES:   I have reviewed the following: 1. No acute or focal lesion to explain the patient's symptoms. 2. Descending and sigmoid diverticulosis without diverticulitis. 3. Cholelithiasis without evidence of cholecystitis. 4. Multilevel degenerative changes in the lumbar spine. 5. Continued interval growth of infrarenal abdominal aortic aneurysm, now measuring 5.3 x 4.9 cm. Recommend follow-up CT/MR every 6 months and  vascular consultation. This recommendation follows ACR consensus guidelines: White Paper of the  ACR Incidental Findings Committee II on Vascular Findings. J Am Coll Radiol 2013; 10:789-794. 6.  Aortic Atherosclerosis (ICD10-I70.0).  MEDICAL ISSUES:   AAA: The patient has had aneurysmal growth within the past 6 months.  Maximum diameter is approaching 5.4/5.5 cm.  He has adequate anatomy for endovascular repair and so I have recommended proceeding with repair to prevent rupture.  This should be able to be done through a percutaneous approach.  I had an extensive discussion with the patient regarding the details of the procedure as well as the risks and benefits.  We talked about the risk of a type II endoleak.  We talked about the risk of cardiopulmonary complications, stroke, intestinal ischemia, renal failure, and lower extremity blood flow issues.  All of his questions were answered and he wishes to proceed.    Charlena Cross, MD, FACS [facility] [phone] Pager [phone]

## 2023-05-02 NOTE — Progress Notes (Signed)
[facility] [place]  Patient name: Thomas [name] MRN: [id number] DOB: [birth year] Sex: male   REASON FOR VISIT:    Follow up  HISOTRY OF PRESENT ILLNESS:    Thomas [name] is a 82 y.o. male, former patient of Dr. Arbie Cookey, who has been following him for an abdominal aortic aneurysm.  He was last seen in March 2024 at which time his aneurysm measured 5.25 cm.  He is back today for follow-up.  He denies any abdominal pain.  The patient is a former smoker.  He is medically managed for hypertension.  History of coronary artery disease, status post CABG in 2006   PAST MEDICAL HISTORY:   Past Medical History:  Diagnosis Date   AAA (abdominal aortic aneurysm) (HCC)    4.2 cm by CTA 10/01/13- states "much smaller than first expected"Dr. Valentina Lucks follows-checks every 2 yrs   Allergic asthma    Allergic rhinitis    Anxiety depression   Arthritis    torn rotator cuff left shoulder   Bruise    left chest area"recent fall"-no rib fracture identified.   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   Closed left hip fracture, initial encounter (HCC) 10/01/2019   Colitis    GERD (gastroesophageal reflux disease)    History of measles    History of mumps    HLD (hyperlipidemia)    HTN (hypertension)    Obesity    Polio    left leg   Pupil irregularity, right    old injury"pupil will not contract to light"   Shortness of breath    exertion, asthma attack     FAMILY HISTORY:   Family History  Problem Relation Age of Onset   Alzheimer's disease Mother    Liver disease Sister    Lung cancer Brother     SOCIAL HISTORY:   Social History   Tobacco Use   Smoking status: Former    Current packs/day: 0.00    Average packs/day: 1 pack/day for 37.0 years (37.0 ttl pk-yrs)    Types: Cigarettes    Start date: 08/24/1955    Quit date: 08/23/1992    Years since quitting: 30.7   Smokeless tobacco: Never  Substance Use  Topics   Alcohol use: Not Currently    Comment: socially      ALLERGIES:   Allergies  Allergen Reactions   Aspirin Hives   Codeine Hives    Other Reaction(s): whelps, Hydrocodone- ok, no problemHy   Fluticasone-Salmeterol Hives   Cephalexin Rash   Hydrochlorothiazide Rash   Ibuprofen Rash   Latex Rash   Montelukast Sodium Rash   Penicillins Rash   Statins Rash   Sulfonamide Derivatives Rash   Tape Rash    Other Reaction(s): rash and blisters     CURRENT MEDICATIONS:   Current Outpatient Medications  Medication Sig Dispense Refill   aspirin EC 81 MG tablet Take 81 mg by mouth daily.     budesonide-formoterol (SYMBICORT) 160-4.5 MCG/ACT inhaler 2 puffs Inhalation Twice a day     Cholecalciferol (VITAMIN D-3) 125 MCG (5000 UT) TABS Take 5,000 Units by mouth daily. (Patient not taking: Reported on 08/17/2022) 90 tablet 1   escitalopram (LEXAPRO) 10 MG tablet Take 10 mg by mouth daily.     ezetimibe (ZETIA) 10 MG tablet TAKE 1 TABLET BY MOUTH EVERY DAY 90 tablet 3   fexofenadine (ALLEGRA) 180 MG tablet Take 180 mg by mouth daily as needed for allergies.  furosemide (LASIX) 20 MG tablet Take 20 mg by mouth as needed for fluid.     lisinopril (ZESTRIL) 20 MG tablet Take 20 mg by mouth 2 (two) times daily.     loperamide (IMODIUM) 2 MG capsule Take 1 capsule (2 mg total) by mouth every 4 (four) hours as needed for diarrhea or loose stools. 30 capsule 0   LORazepam (ATIVAN) 1 MG tablet Take 1 mg by mouth at bedtime as needed.     mupirocin ointment (BACTROBAN) 2 % Apply 1 Application topically 2 (two) times daily.     predniSONE (DELTASONE) 5 MG tablet Take 5 mg by mouth daily.     PROAIR HFA 108 (90 Base) MCG/ACT inhaler Inhale 2 puffs into the lungs every 4 (four) hours as needed for wheezing.      No current facility-administered medications for this visit.    REVIEW OF SYSTEMS:   [X]  denotes positive finding, [ ]  denotes negative finding Cardiac  Comments:  Chest  pain or chest pressure:    Shortness of breath upon exertion:    Short of breath when lying flat:    Irregular heart rhythm:        Vascular    Pain in calf, thigh, or hip brought on by ambulation:    Pain in feet at night that wakes you up from your sleep:     Blood clot in your veins:    Leg swelling:         Pulmonary    Oxygen at home:    Productive cough:     Wheezing:         Neurologic    Sudden weakness in arms or legs:     Sudden numbness in arms or legs:     Sudden onset of difficulty speaking or slurred speech:    Temporary loss of vision in one eye:     Problems with dizziness:         Gastrointestinal    Blood in stool:     Vomited blood:         Genitourinary    Burning when urinating:     Blood in urine:        Psychiatric    Major depression:         Hematologic    Bleeding problems:    Problems with blood clotting too easily:        Skin    Rashes or ulcers:        Constitutional    Fever or chills:      PHYSICAL EXAM:   There were no vitals filed for this visit.  GENERAL: The patient is a well-nourished male, in no acute distress. The vital signs are documented above. CARDIAC: There is a regular rate and rhythm.  VASCULAR: Palpable pedal pulses PULMONARY: Non-labored respirations ABDOMEN: Soft and non-tender  MUSCULOSKELETAL: There are no major deformities or cyanosis. NEUROLOGIC: No focal weakness or paresthesias are detected. SKIN: There are no ulcers or rashes noted. PSYCHIATRIC: The patient has a normal affect.  STUDIES:   I have reviewed the following: 1. No acute or focal lesion to explain the patient's symptoms. 2. Descending and sigmoid diverticulosis without diverticulitis. 3. Cholelithiasis without evidence of cholecystitis. 4. Multilevel degenerative changes in the lumbar spine. 5. Continued interval growth of infrarenal abdominal aortic aneurysm, now measuring 5.3 x 4.9 cm. Recommend follow-up CT/MR every 6 months and  vascular consultation. This recommendation follows ACR consensus guidelines: White Paper of the  ACR Incidental Findings Committee II on Vascular Findings. J Am Coll Radiol 2013; 10:789-794. 6.  Aortic Atherosclerosis (ICD10-I70.0).  MEDICAL ISSUES:   AAA: The patient has had aneurysmal growth within the past 6 months.  Maximum diameter is approaching 5.4/5.5 cm.  He has adequate anatomy for endovascular repair and so I have recommended proceeding with repair to prevent rupture.  This should be able to be done through a percutaneous approach.  I had an extensive discussion with the patient regarding the details of the procedure as well as the risks and benefits.  We talked about the risk of a type II endoleak.  We talked about the risk of cardiopulmonary complications, stroke, intestinal ischemia, renal failure, and lower extremity blood flow issues.  All of his questions were answered and he wishes to proceed.    Charlena Cross, MD, FACS [facility] [phone] Pager [phone]

## 2023-05-03 ENCOUNTER — Other Ambulatory Visit: Payer: Self-pay

## 2023-05-03 DIAGNOSIS — I7143 Infrarenal abdominal aortic aneurysm, without rupture: Secondary | ICD-10-CM

## 2023-05-10 ENCOUNTER — Encounter (HOSPITAL_COMMUNITY): Payer: Self-pay | Admitting: Surgery

## 2023-05-10 NOTE — Progress Notes (Signed)
Anesthesia Chart Review: Same-day workup  Patient follows annually with cardiology for history of CAD s/p CABG x 4 in 2006, HLD, HTN.  Lexiscan 2012 was normal.  Echo 2012 showed EF 55 to 60%, normal valves.  Last seen by Thomas Fabian, NP on 08/17/2022 and noted to be doing well at that time.  Per note, he denied any cardiopulmonary complaints, reported he continues to use a stationary bike start pelvic exercise although his overall mobility is limited due to previous hip fracture.  No changes to management.  1 year follow-up recommended.  Follows with vascular surgery for history of AAA.  Last seen by [name] 05/02/2023.  At that time he was noted to have interval aneurysmal growth with maximum diameter approaching 5.4/5.5 cm.  He was recommended to undergo endovascular repair.  Patient will need day of surgery labs and evaluation.  EKG 04/08/2023: Sinus rhythm.  Rate 71.  Prolonged PR interval.  Borderline right axis deviation.  Low voltage precordial leads.  Borderline T abnormalities, inferior leads.   Thomas [name] [facility] Short Stay [facility]/Anesthesiology Phone [phone] 05/10/2023 12:01 PM

## 2023-05-10 NOTE — Progress Notes (Signed)
SDW call  Patient was given pre-op instructions over the phone. Patient verbalized understanding of instructions provided.     PCP - Dr. Eleanora Neighbor Cardiologist - Dr. Lennie Odor Pulmonary:    PPM/ICD - denies Device Orders - n/a Rep Notified - n/a   Chest x-ray - n/a EKG -  04/11/2023 Stress Test - ECHO - 2012 Cardiac Cath - 2006  Sleep Study/sleep apnea/CPAP: denies  Non-diabetic  Blood Thinner Instructions: denies Aspirin Instructions: continue   ERAS Protcol - NPO   COVID TEST- n/a    Anesthesia review: Yes. CAD, HTN, SOB, AAA, CABG   Patient denies shortness of breath, fever, cough and chest pain over the phone call  Your procedure is scheduled on Wednesday May 11, 2023  Report to [facility] Main Entrance "A" at  0910  A.M., then check in with the Admitting office.  Call this number if you have problems the morning of surgery:  [phone]   If you have any questions prior to your surgery date call [phone]: Open Monday-Friday 8am-4pm If you experience any cold or flu symptoms such as cough, fever, chills, shortness of breath, etc. between now and your scheduled surgery, please notify us at the above number     Remember:  Do not eat or drink after midnight the night before your surgery  Take these medicines the morning of surgery with A SIP OF WATER:  ASA, zetia, allegra, symbicort  As needed: proair  As of today, STOP taking any Aleve, Naproxen, Ibuprofen, Motrin, Advil, Goody's, BC's, all herbal medications, fish oil, and all vitamins.

## 2023-05-10 NOTE — Anesthesia Preprocedure Evaluation (Signed)
Anesthesia Evaluation  Patient identified by MRN, date of birth, ID band Patient awake    Reviewed: Allergy & Precautions, NPO status , Patient's Chart, lab work & pertinent test results  Airway Mallampati: III  TM Distance: >3 FB Neck ROM: Full    Dental  (+) Dental Advisory Given, Edentulous Lower, Edentulous Upper   Pulmonary asthma , COPD,  COPD inhaler, former smoker   Pulmonary exam normal breath sounds clear to auscultation       Cardiovascular hypertension, Pt. on medications + CAD, + CABG and + Peripheral Vascular Disease (Infrarenal abdominal aortic aneurysm without rupture)  Normal cardiovascular exam Rhythm:Regular Rate:Normal     Neuro/Psych  PSYCHIATRIC DISORDERS Anxiety     negative neurological ROS     GI/Hepatic Neg liver ROS,GERD  ,,  Endo/Other  negative endocrine ROS    Renal/GU negative Renal ROS     Musculoskeletal  (+) Arthritis ,    Abdominal   Peds  Hematology negative hematology ROS (+)   Anesthesia Other Findings Day of surgery medications reviewed with the patient.  Reproductive/Obstetrics                             Anesthesia Physical Anesthesia Plan  ASA: 3  Anesthesia Plan: General   Post-op Pain Management: Tylenol PO (pre-op)*   Induction: Intravenous  PONV Risk Score and Plan: 2 and Dexamethasone and Ondansetron  Airway Management Planned: Oral ETT  Additional Equipment: Arterial line  Intra-op Plan:   Post-operative Plan: Extubation in OR  Informed Consent: I have reviewed the patients History and Physical, chart, labs and discussed the procedure including the risks, benefits and alternatives for the proposed anesthesia with the patient or authorized representative who has indicated his/her understanding and acceptance.     Dental advisory given  Plan Discussed with: CRNA  Anesthesia Plan Comments: (PAT note by Antionette Poles,  PA-C: Patient follows annually with cardiology for history of CAD s/p CABG x 4 in 2006, HLD, HTN.  Lexiscan 2012 was normal.  Echo 2012 showed EF 55 to 60%, normal valves.  Last seen by Edd Fabian, NP on 08/17/2022 and noted to be doing well at that time.  Per note, he denied any cardiopulmonary complaints, reported he continues to use a stationary bike start pelvic exercise although his overall mobility is limited due to previous hip fracture.  No changes to management.  1 year follow-up recommended.  Follows with vascular surgery for history of AAA.  Last seen by Dr. Myra Gianotti 05/02/2023.  At that time he was noted to have interval aneurysmal growth with maximum diameter approaching 5.4/5.5 cm.  He was recommended to undergo endovascular repair.  Patient will need day of surgery labs and evaluation.  EKG 04/08/2023: Sinus rhythm.  Rate 71.  Prolonged PR interval.  Borderline right axis deviation.  Low voltage precordial leads.  Borderline T abnormalities, inferior leads.  )        Anesthesia Quick Evaluation

## 2023-05-11 ENCOUNTER — Inpatient Hospital Stay (HOSPITAL_COMMUNITY): Payer: Medicare Other

## 2023-05-11 ENCOUNTER — Inpatient Hospital Stay (HOSPITAL_COMMUNITY): Payer: Medicare Other | Admitting: Physician Assistant

## 2023-05-11 ENCOUNTER — Other Ambulatory Visit: Payer: Self-pay

## 2023-05-11 ENCOUNTER — Inpatient Hospital Stay (HOSPITAL_COMMUNITY)
Admission: RE | Admit: 2023-05-11 | Discharge: 2023-05-12 | DRG: 269 | Disposition: A | Discharge status: Home / Self Care | Payer: Medicare Other | Attending: Surgery | Admitting: Surgery

## 2023-05-11 ENCOUNTER — Encounter (HOSPITAL_COMMUNITY)
Admission: RE | Disposition: A | Discharge status: Home / Self Care | Payer: Self-pay | Source: Home / Self Care | Attending: Surgery

## 2023-05-11 DIAGNOSIS — J45909 Unspecified asthma, uncomplicated: Secondary | ICD-10-CM | POA: Diagnosis not present

## 2023-05-11 DIAGNOSIS — Z87891 Personal history of nicotine dependence: Secondary | ICD-10-CM

## 2023-05-11 DIAGNOSIS — E785 Hyperlipidemia, unspecified: Secondary | ICD-10-CM | POA: Diagnosis present

## 2023-05-11 DIAGNOSIS — Z79899 Other long term (current) drug therapy: Secondary | ICD-10-CM | POA: Diagnosis not present

## 2023-05-11 DIAGNOSIS — F419 Anxiety disorder, unspecified: Secondary | ICD-10-CM | POA: Diagnosis present

## 2023-05-11 DIAGNOSIS — I714 Abdominal aortic aneurysm, without rupture, unspecified: Principal | ICD-10-CM | POA: Diagnosis present

## 2023-05-11 DIAGNOSIS — Z885 Allergy status to narcotic agent status: Secondary | ICD-10-CM

## 2023-05-11 DIAGNOSIS — K219 Gastro-esophageal reflux disease without esophagitis: Secondary | ICD-10-CM | POA: Diagnosis not present

## 2023-05-11 DIAGNOSIS — Z801 Family history of malignant neoplasm of trachea, bronchus and lung: Secondary | ICD-10-CM

## 2023-05-11 DIAGNOSIS — I2510 Atherosclerotic heart disease of native coronary artery without angina pectoris: Secondary | ICD-10-CM | POA: Diagnosis present

## 2023-05-11 DIAGNOSIS — Z7982 Long term (current) use of aspirin: Secondary | ICD-10-CM

## 2023-05-11 DIAGNOSIS — Z9889 Other specified postprocedural states: Secondary | ICD-10-CM

## 2023-05-11 DIAGNOSIS — I7143 Infrarenal abdominal aortic aneurysm, without rupture: Principal | ICD-10-CM | POA: Diagnosis present

## 2023-05-11 DIAGNOSIS — I119 Hypertensive heart disease without heart failure: Secondary | ICD-10-CM | POA: Diagnosis not present

## 2023-05-11 DIAGNOSIS — Z820 Family history of epilepsy and other diseases of the nervous system: Secondary | ICD-10-CM

## 2023-05-11 DIAGNOSIS — Z886 Allergy status to analgesic agent status: Secondary | ICD-10-CM | POA: Diagnosis not present

## 2023-05-11 DIAGNOSIS — Z880 Allergy status to penicillin: Secondary | ICD-10-CM

## 2023-05-11 DIAGNOSIS — Z7951 Long term (current) use of inhaled steroids: Secondary | ICD-10-CM

## 2023-05-11 DIAGNOSIS — I10 Essential (primary) hypertension: Secondary | ICD-10-CM | POA: Diagnosis present

## 2023-05-11 DIAGNOSIS — F32A Depression, unspecified: Secondary | ICD-10-CM | POA: Diagnosis present

## 2023-05-11 DIAGNOSIS — J449 Chronic obstructive pulmonary disease, unspecified: Secondary | ICD-10-CM | POA: Diagnosis not present

## 2023-05-11 DIAGNOSIS — Z951 Presence of aortocoronary bypass graft: Secondary | ICD-10-CM | POA: Diagnosis not present

## 2023-05-11 DIAGNOSIS — Z882 Allergy status to sulfonamides status: Secondary | ICD-10-CM

## 2023-05-11 DIAGNOSIS — Z8619 Personal history of other infectious and parasitic diseases: Secondary | ICD-10-CM | POA: Diagnosis not present

## 2023-05-11 DIAGNOSIS — Z8679 Personal history of other diseases of the circulatory system: Secondary | ICD-10-CM

## 2023-05-11 DIAGNOSIS — Z881 Allergy status to other antibiotic agents status: Secondary | ICD-10-CM | POA: Diagnosis not present

## 2023-05-11 DIAGNOSIS — Z91040 Latex allergy status: Secondary | ICD-10-CM | POA: Diagnosis not present

## 2023-05-11 DIAGNOSIS — E119 Type 2 diabetes mellitus without complications: Secondary | ICD-10-CM | POA: Diagnosis not present

## 2023-05-11 HISTORY — PX: ULTRASOUND GUIDANCE FOR VASCULAR ACCESS: SHX6516

## 2023-05-11 HISTORY — PX: ABDOMINAL AORTIC ENDOVASCULAR STENT GRAFT: SHX5707

## 2023-05-11 LAB — TYPE AND SCREEN
ABO/RH(D): O POS
Antibody Screen: NEGATIVE

## 2023-05-11 LAB — COMPREHENSIVE METABOLIC PANEL WITH GFR
ALT: 26 U/L (ref 0–44)
AST: 46 U/L — ABNORMAL HIGH (ref 15–41)
Albumin: 3.4 g/dL — ABNORMAL LOW (ref 3.5–5.0)
Alkaline Phosphatase: 57 U/L (ref 38–126)
Anion gap: 16 — ABNORMAL HIGH (ref 5–15)
BUN: 14 mg/dL (ref 8–23)
CO2: 18 mmol/L — ABNORMAL LOW (ref 22–32)
Calcium: 9.1 mg/dL (ref 8.9–10.3)
Chloride: 106 mmol/L (ref 98–111)
Creatinine, Ser: 0.99 mg/dL (ref 0.61–1.24)
GFR, Estimated: >60 mL/min (ref >60)
Glucose, Bld: 99 mg/dL (ref 70–99)
Potassium: 4.9 mmol/L (ref 3.5–5.1)
Sodium: 140 mmol/L (ref 135–145)
Total Bilirubin: 1.2 mg/dL (ref 0.3–1.2)
Total Protein: 7.2 g/dL (ref 6.5–8.1)

## 2023-05-11 LAB — CBC
HCT: 37.1 % — ABNORMAL LOW (ref 39.0–52.0)
Hemoglobin: 12.2 g/dL — ABNORMAL LOW (ref 13.0–17.0)
MCH: 31 pg (ref 26.0–34.0)
MCHC: 32.9 g/dL (ref 30.0–36.0)
MCV: 94.4 fL (ref 80.0–100.0)
Platelets: 199 10*3/uL (ref 150–400)
RBC: 3.93 MIL/uL — ABNORMAL LOW (ref 4.22–5.81)
RDW: 14.3 % (ref 11.5–15.5)
WBC: 5 10*3/uL (ref 4.0–10.5)
nRBC: 0 % (ref 0.0–0.2)

## 2023-05-11 LAB — URINALYSIS, ROUTINE W REFLEX MICROSCOPIC
Bilirubin Urine: NEGATIVE
Glucose, UA: NEGATIVE mg/dL
Hgb urine dipstick: NEGATIVE
Ketones, ur: NEGATIVE mg/dL
Leukocytes,Ua: NEGATIVE
Nitrite: NEGATIVE
Protein, ur: NEGATIVE mg/dL
Specific Gravity, Urine: 1.025 (ref 1.005–1.030)
pH: 6 (ref 5.0–8.0)

## 2023-05-11 LAB — POCT ACTIVATED CLOTTING TIME
Activated Clotting Time: 238 s
Activated Clotting Time: 201 s

## 2023-05-11 LAB — PROTIME-INR
INR: 1 (ref 0.8–1.2)
Prothrombin Time: 13.5 s (ref 11.4–15.2)

## 2023-05-11 LAB — SURGICAL PCR SCREEN
MRSA, PCR: NEGATIVE
Staphylococcus aureus: NEGATIVE

## 2023-05-11 LAB — APTT: aPTT: 29 s (ref 24–36)

## 2023-05-11 SURGERY — INSERTION, ENDOVASCULAR STENT GRAFT, AORTA, ABDOMINAL
Anesthesia: General | Site: Groin | Laterality: Bilateral

## 2023-05-11 MED ORDER — LABETALOL HCL 5 MG/ML IV SOLN
INTRAVENOUS | Status: AC
  Filled 2023-05-11: qty 4

## 2023-05-11 MED ORDER — ALUM & MAG HYDROXIDE-SIMETH 200-200-20 MG/5ML PO SUSP: 15.0000 mL | ORAL | Status: DC | PRN

## 2023-05-11 MED ORDER — ROCURONIUM BROMIDE 10 MG/ML (PF) SYRINGE
PREFILLED_SYRINGE | INTRAVENOUS | Status: DC | PRN
  Administered 2023-05-11: 50 mg via INTRAVENOUS
  Administered 2023-05-11: 30 mg via INTRAVENOUS

## 2023-05-11 MED ORDER — SUGAMMADEX SODIUM 200 MG/2ML IV SOLN
INTRAVENOUS | Status: DC | PRN
  Administered 2023-05-11: 200 mg via INTRAVENOUS

## 2023-05-11 MED ORDER — FENTANYL CITRATE (PF) 250 MCG/5ML IJ SOLN
INTRAMUSCULAR | Status: DC | PRN
  Administered 2023-05-11: 50 ug via INTRAVENOUS

## 2023-05-11 MED ORDER — PROTAMINE SULFATE 10 MG/ML IV SOLN
INTRAVENOUS | Status: DC | PRN
Start: 2023-05-11 — End: 2023-05-11
  Administered 2023-05-11 (×2): 25 mg via INTRAVENOUS

## 2023-05-11 MED ORDER — EZETIMIBE 10 MG PO TABS
10.0000 mg | ORAL_TABLET | Freq: Every day | ORAL | Status: DC
  Administered 2023-05-11 – 2023-05-12 (×2): 10 mg via ORAL
  Filled 2023-05-11 (×2): qty 1

## 2023-05-11 MED ORDER — CHLORHEXIDINE GLUCONATE 0.12 % MT SOLN
15.0000 mL | Freq: Once | OROMUCOSAL | Status: AC
  Administered 2023-05-11: 15 mL via OROMUCOSAL
  Filled 2023-05-11: qty 15

## 2023-05-11 MED ORDER — SODIUM CHLORIDE 0.9 % IV SOLN: 500.0000 mL | Freq: Once | INTRAVENOUS | Status: DC | PRN

## 2023-05-11 MED ORDER — LABETALOL HCL 5 MG/ML IV SOLN
INTRAVENOUS | Status: DC | PRN
Start: 2023-05-11 — End: 2023-05-11
  Administered 2023-05-11: 10 mg via INTRAVENOUS
  Administered 2023-05-11: 5 mg via INTRAVENOUS

## 2023-05-11 MED ORDER — IPRATROPIUM-ALBUTEROL 0.5-2.5 (3) MG/3ML IN SOLN
3.0000 mL | RESPIRATORY_TRACT | Status: DC
  Administered 2023-05-11: 3 mL via RESPIRATORY_TRACT

## 2023-05-11 MED ORDER — PHENYLEPHRINE HCL-NACL 20-0.9 MG/250ML-% IV SOLN
INTRAVENOUS | Status: DC | PRN
  Administered 2023-05-11: 20 ug/min via INTRAVENOUS

## 2023-05-11 MED ORDER — ACETAMINOPHEN 500 MG PO TABS
1000.0000 mg | ORAL_TABLET | Freq: Once | ORAL | Status: AC
  Administered 2023-05-11: 1000 mg via ORAL
  Filled 2023-05-11: qty 2

## 2023-05-11 MED ORDER — LACTATED RINGERS IV SOLN: INTRAVENOUS | Status: DC

## 2023-05-11 MED ORDER — PROPOFOL 10 MG/ML IV BOLUS
INTRAVENOUS | Status: DC | PRN
  Administered 2023-05-11: 100 mg via INTRAVENOUS

## 2023-05-11 MED ORDER — MORPHINE SULFATE (PF) 2 MG/ML IV SOLN: 2.0000 mg | INTRAVENOUS | Status: DC | PRN

## 2023-05-11 MED ORDER — VANCOMYCIN HCL IN DEXTROSE 1-5 GM/200ML-% IV SOLN
1000.0000 mg | Freq: Two times a day (BID) | INTRAVENOUS | Status: DC
  Administered 2023-05-11: 1000 mg via INTRAVENOUS
  Filled 2023-05-11 (×2): qty 200

## 2023-05-11 MED ORDER — GLYCOPYRROLATE 0.2 MG/ML IJ SOLN
INTRAMUSCULAR | Status: DC | PRN
Start: 2023-05-11 — End: 2023-05-11
  Administered 2023-05-11: .2 mg via INTRAVENOUS

## 2023-05-11 MED ORDER — VANCOMYCIN HCL IN DEXTROSE 1-5 GM/200ML-% IV SOLN
1000.0000 mg | INTRAVENOUS | Status: AC
  Administered 2023-05-11: 1000 mg via INTRAVENOUS
  Filled 2023-05-11: qty 200

## 2023-05-11 MED ORDER — SODIUM CHLORIDE 0.9 % IV SOLN: INTRAVENOUS | Status: DC

## 2023-05-11 MED ORDER — MAGNESIUM SULFATE 2 GM/50ML IV SOLN: 2.0000 g | Freq: Every day | INTRAVENOUS | Status: DC | PRN

## 2023-05-11 MED ORDER — ACETAMINOPHEN 650 MG RE SUPP: 325.0000 mg | RECTAL | Status: DC | PRN

## 2023-05-11 MED ORDER — HEPARIN 6000 UNIT IRRIGATION SOLUTION
Status: DC | PRN
  Administered 2023-05-11: 1

## 2023-05-11 MED ORDER — 0.9 % SODIUM CHLORIDE (POUR BTL) OPTIME
TOPICAL | Status: DC | PRN
  Administered 2023-05-11: 1000 mL

## 2023-05-11 MED ORDER — PHENOL 1.4 % MT LIQD: 1.0000 | OROMUCOSAL | Status: DC | PRN

## 2023-05-11 MED ORDER — PANTOPRAZOLE SODIUM 40 MG PO TBEC
40.0000 mg | DELAYED_RELEASE_TABLET | Freq: Every day | ORAL | Status: DC
  Administered 2023-05-11 – 2023-05-12 (×2): 40 mg via ORAL
  Filled 2023-05-11 (×2): qty 1

## 2023-05-11 MED ORDER — GUAIFENESIN-DM 100-10 MG/5ML PO SYRP: 15.0000 mL | ORAL_SOLUTION | ORAL | Status: DC | PRN

## 2023-05-11 MED ORDER — DOCUSATE SODIUM 100 MG PO CAPS
100.0000 mg | ORAL_CAPSULE | Freq: Every day | ORAL | Status: DC
  Administered 2023-05-12: 100 mg via ORAL
  Filled 2023-05-11: qty 1

## 2023-05-11 MED ORDER — ASPIRIN 81 MG PO TBEC
81.0000 mg | DELAYED_RELEASE_TABLET | Freq: Every day | ORAL | Status: DC
  Administered 2023-05-12: 81 mg via ORAL
  Filled 2023-05-11: qty 1

## 2023-05-11 MED ORDER — LIDOCAINE 2% (20 MG/ML) 5 ML SYRINGE
INTRAMUSCULAR | Status: DC | PRN
  Administered 2023-05-11: 80 mg via INTRAVENOUS

## 2023-05-11 MED ORDER — METOPROLOL TARTRATE 5 MG/5ML IV SOLN: 2.0000 mg | INTRAVENOUS | Status: DC | PRN

## 2023-05-11 MED ORDER — CHLORHEXIDINE GLUCONATE CLOTH 2 % EX PADS
6.0000 | MEDICATED_PAD | Freq: Once | CUTANEOUS | Status: AC
  Administered 2023-05-11: 6 via TOPICAL

## 2023-05-11 MED ORDER — CHLORHEXIDINE GLUCONATE CLOTH 2 % EX PADS: 6.0000 | MEDICATED_PAD | Freq: Once | CUTANEOUS | Status: DC

## 2023-05-11 MED ORDER — POTASSIUM CHLORIDE CRYS ER 20 MEQ PO TBCR: 20.0000 meq | EXTENDED_RELEASE_TABLET | Freq: Every day | ORAL | Status: DC | PRN

## 2023-05-11 MED ORDER — IPRATROPIUM-ALBUTEROL 0.5-2.5 (3) MG/3ML IN SOLN
RESPIRATORY_TRACT | Status: AC
  Filled 2023-05-11: qty 3

## 2023-05-11 MED ORDER — FENTANYL CITRATE (PF) 250 MCG/5ML IJ SOLN
INTRAMUSCULAR | Status: AC
  Filled 2023-05-11: qty 5

## 2023-05-11 MED ORDER — ORAL CARE MOUTH RINSE: 15.0000 mL | Freq: Once | OROMUCOSAL | Status: AC

## 2023-05-11 MED ORDER — LABETALOL HCL 5 MG/ML IV SOLN
10.0000 mg | INTRAVENOUS | Status: DC | PRN
  Administered 2023-05-12: 10 mg via INTRAVENOUS
  Filled 2023-05-11: qty 4

## 2023-05-11 MED ORDER — ACETAMINOPHEN 325 MG PO TABS: 325.0000 mg | ORAL_TABLET | ORAL | Status: DC | PRN

## 2023-05-11 MED ORDER — PROPOFOL 10 MG/ML IV BOLUS
INTRAVENOUS | Status: AC
  Filled 2023-05-11: qty 20

## 2023-05-11 MED ORDER — ONDANSETRON HCL 4 MG/2ML IJ SOLN
INTRAMUSCULAR | Status: DC | PRN
  Administered 2023-05-11: 4 mg via INTRAVENOUS

## 2023-05-11 MED ORDER — ONDANSETRON HCL 4 MG/2ML IJ SOLN
4.0000 mg | Freq: Four times a day (QID) | INTRAMUSCULAR | Status: DC | PRN
  Administered 2023-05-11: 4 mg via INTRAVENOUS
  Filled 2023-05-11: qty 2

## 2023-05-11 MED ORDER — IODIXANOL 320 MG/ML IV SOLN
INTRAVENOUS | Status: DC | PRN
  Administered 2023-05-11: 43 mL via INTRA_ARTERIAL

## 2023-05-11 MED ORDER — OXYCODONE-ACETAMINOPHEN 5-325 MG PO TABS: 1.0000 | ORAL_TABLET | ORAL | Status: DC | PRN

## 2023-05-11 MED ORDER — HYDRALAZINE HCL 20 MG/ML IJ SOLN: 5.0000 mg | INTRAMUSCULAR | Status: DC | PRN

## 2023-05-11 MED ORDER — LISINOPRIL 20 MG PO TABS
30.0000 mg | ORAL_TABLET | Freq: Every day | ORAL | Status: DC
  Administered 2023-05-12: 30 mg via ORAL
  Filled 2023-05-11 (×2): qty 1

## 2023-05-11 SURGICAL SUPPLY — 52 items
ADH SKN CLS APL DERMABOND .7 (GAUZE/BANDAGES/DRESSINGS) ×2
BAG COUNTER SPONGE SURGICOUNT (BAG) ×1 IMPLANT
BAG SPNG CNTER NS LX DISP (BAG) ×1
BLADE CLIPPER SURG (BLADE) ×1 IMPLANT
CANISTER SUCT 3000ML PPV (MISCELLANEOUS) ×1 IMPLANT
CATH BEACON 5.038 65CM KMP-01 (CATHETERS) ×1 IMPLANT
CATH OMNI FLUSH .035X70CM (CATHETERS) ×1 IMPLANT
DERMABOND ADVANCED .7 DNX12 (GAUZE/BANDAGES/DRESSINGS) ×1 IMPLANT
DEVICE CLOSURE PERCLS PRGLD 6F (VASCULAR PRODUCTS) ×4 IMPLANT
DEVICE TORQUE H2O (MISCELLANEOUS) IMPLANT
DRSG TEGADERM 2-3/8X2-3/4 SM (GAUZE/BANDAGES/DRESSINGS) ×2 IMPLANT
ELECT CAUTERY BLADE 6.4 (BLADE) ×1 IMPLANT
ELECT REM PT RETURN 9FT ADLT (ELECTROSURGICAL) ×2
ELECTRODE REM PT RTRN 9FT ADLT (ELECTROSURGICAL) ×2 IMPLANT
EXCLDR TRNK 28.5X14.5X12 16F (Endovascular Graft) ×1 IMPLANT
EXCLUDER TNK 28.5X14.5X12 16F (Endovascular Graft) IMPLANT
EXTENDER ENDOPROSTHESIS 14X7 (Endovascular Graft) IMPLANT
GAUZE SPONGE 2X2 8PLY STRL LF (GAUZE/BANDAGES/DRESSINGS) ×2 IMPLANT
GLOVE SURG SS PI 7.5 STRL IVOR (GLOVE) ×3 IMPLANT
GOWN STRL REUS W/ TWL LRG LVL3 (GOWN DISPOSABLE) ×2 IMPLANT
GOWN STRL REUS W/ TWL XL LVL3 (GOWN DISPOSABLE) ×2 IMPLANT
GOWN STRL REUS W/TWL LRG LVL3 (GOWN DISPOSABLE) ×2
GOWN STRL REUS W/TWL XL LVL3 (GOWN DISPOSABLE) ×2
GRAFT BALLN CATH 65CM (BALLOONS) ×1 IMPLANT
GUIDEWIRE ANGLED .035X150CM (WIRE) IMPLANT
KIT BASIN OR (CUSTOM PROCEDURE TRAY) ×1 IMPLANT
KIT TURNOVER KIT B (KITS) ×1 IMPLANT
LEG CONTRALATERAL 16X14.5X10 (Vascular Products) IMPLANT
NS IRRIG 1000ML POUR BTL (IV SOLUTION) ×1 IMPLANT
PACK ENDOVASCULAR (PACKS) ×1 IMPLANT
PAD ARMBOARD 7.5X6 YLW CONV (MISCELLANEOUS) ×2 IMPLANT
PERCLOSE PROGLIDE 6F (VASCULAR PRODUCTS) ×5
SET MICROPUNCTURE 5F STIFF (MISCELLANEOUS) ×1 IMPLANT
SHEATH BRITE TIP 8FR 23CM (SHEATH) ×1 IMPLANT
SHEATH DRYSEAL FLEX 12FR 33CM (SHEATH) IMPLANT
SHEATH DRYSEAL FLEX 18FR 33CM (SHEATH) IMPLANT
SHEATH PINNACLE 8F 10CM (SHEATH) ×1 IMPLANT
STENT GRAFT CONTRALAT 16X12X10 (Vascular Products) IMPLANT
STOPCOCK MORSE 400PSI 3WAY (MISCELLANEOUS) ×1 IMPLANT
SUT PROLENE 5 0 C 1 24 (SUTURE) IMPLANT
SUT VIC AB 2-0 CT1 27 (SUTURE)
SUT VIC AB 2-0 CT1 TAPERPNT 27 (SUTURE) IMPLANT
SUT VIC AB 3-0 SH 27 (SUTURE)
SUT VIC AB 3-0 SH 27X BRD (SUTURE) IMPLANT
SUT VICRYL 4-0 PS2 18IN ABS (SUTURE) IMPLANT
SYR 20ML LL LF (SYRINGE) ×1 IMPLANT
TOWEL GREEN STERILE (TOWEL DISPOSABLE) ×1 IMPLANT
TRAY FOLEY MTR SLVR 16FR STAT (SET/KITS/TRAYS/PACK) ×1 IMPLANT
TRAY FOLEY SLVR 16FR LF STAT (SET/KITS/TRAYS/PACK) IMPLANT
TUBING HIGH PRESSURE 120CM (CONNECTOR) ×1 IMPLANT
WIRE AMPLATZ SS-J .035X180CM (WIRE) ×2 IMPLANT
WIRE BENTSON .035X145CM (WIRE) ×2 IMPLANT

## 2023-05-11 NOTE — Interval H&P Note (Signed)
History and Physical Interval Note:  05/11/2023 11:10 AM  Thomas [name]  has presented today for surgery, with the diagnosis of Infrarenal abdominal aortic aneurysm without rupture.  The various methods of treatment have been discussed with the patient and family. After consideration of risks, benefits and other options for treatment, the patient has consented to  Procedure(s): ABDOMINAL AORTIC ENDOVASCULAR STENT GRAFT (N/A) ULTRASOUND GUIDANCE FOR VASCULAR ACCESS (Bilateral) as a surgical intervention.  The patient's history has been reviewed, patient examined, no change in status, stable for surgery.  I have reviewed the patient's chart and labs.  Questions were answered to the patient's satisfaction.     [name]

## 2023-05-11 NOTE — Op Note (Signed)
Patient name: MAXEN LITHERLAND MRN: [id number] DOB: 1941/01/17 Sex: male  05/11/2023 Pre-operative Diagnosis: Abdominal aortic aneurysm Post-operative diagnosis:  Same Surgeon:  [name] Assistants:  Aggie Moats, PA Procedure:   #1: Endovascular repair of abdominal aortic aneurysm using bifurcated device (CPT [phone])   #2: Bilateral ultrasound-guided comfortably access (CPT [phone])   #3: Catheter aorta x 2   #4: Abdominal aortogram   #5: EVAR extension (CPT [phone])   #6: Radiology S and I codes Anesthesia:  General Blood Loss:  minimal Specimens:  none  Findings: Complete exclusion Devices used: Main body was primary right Gore 28 x 14 x 12.  Ipsilateral right extension was a Gore 14 x 7.  Contralateral left extension was a Gore 12 x 10  Indications: This is an 82 year old gentleman with a progressively enlarging 5.3 cm infrarenal abdominal aortic aneurysm.  He comes in today for endovascular repair.  Procedure:  The patient was identified in the holding area and taken to [facility] OR ROOM 16  The patient was then placed supine on the table. general anesthesia was administered.  The patient was prepped and draped in the usual sterile fashion.  A time out was called and antibiotics were administered.  A PA was necessary to expedite the procedure and assist with technical details.  He help with wire exchanges, device deployment, and manual pressure during arteriotomy closure  Ultrasound was used to evaluate bilateral common femoral arteries which were mildly calcified.  A #11 blade was used to make a skin nick bilaterally.  Bilateral common femoral arteries were then cannulated under ultrasound guidance with a micropuncture needle.  A 018 wire was inserted without resistance followed by placement of a micropuncture sheath.  Next, a Bentson wire was placed.  The subcutaneous tract was dilated with an 8 [place] dilator.  Pro-glide devices were placed for free closure at the 11:00 and 1 o'clock  position.  8 French sheaths were placed bilaterally.  The patient was fully heparinized.  A pigtail catheter was advanced up the left side to the level of L1.  A Amplatz Super Stiff wire was advanced up the right side followed by placement of an 18 French sheath.  The main body was prepared on the back table.  This was a Gore 28 x 14 x 12 device.  It was inserted through the sheath on the right.  An abdominal aortogram was performed locating the renal arteries.  The main body device was then deployed down to the contralateral gate.  A follow-up angiogram was performed.  Based off these images, I reconstraining the graft and advanced it another 2 mm to the level of the renal artery.  The constraining tie was then released.  Next, the PA cannulated the contralateral gate with a Bentson wire and Berenstein 2 catheter.  A pigtail catheter was then able to be rotated freely within the main body of the graft confirming successful cannulation.  An Amplatz Super Stiff wire was then placed.  The image detector was rotated to a RAO position and a retrograde injection was performed for the sheath.  This located the left hypogastric artery.  A 12 French sheath was then inserted.  The contralateral left extension was then inserted and deployed at the level of the left hypogastric artery.  This was a 12 x 10 Gore device.  Next, the remaining portion of the ipsilateral limb was deployed.  The image detector was rotated to a LAO position and a retrograde injection was performed through  the sheath locating the right hypogastric artery.  Next, a 14 x 7 ipsilateral extension was deployed landing just short of the right hypogastric artery next, a MOB balloon was used to mold the proximal and distal attachment sites as well as device overlap.  Completion imaging was then performed which showed complete exclusion of the aneurysm with preserved patency through bilateral renal arteries as well as bilateral common and external iliac  arteries.  There was no endoleak.  The stiff wires were exchanged out for Bentson wires and sheaths were then removed.  The Pro-glide devices were used to close the arteriotomy site.  We evaluated signals in the feet which were present bilaterally.  The patient was given 50 mg of protamine.  Both groins were soft without hematoma.  Cautery was used on the subcutaneous tissue followed by Dermabond.  There were no immediate complications.  The patient was successfully extubated taken recovery in stable condition.  There were no immediate complications.   Disposition: To PACU stable.   Juleen China, M.D., [facility] [place] Office: [phone] Pager:  [phone]

## 2023-05-11 NOTE — Transfer of Care (Signed)
Immediate Anesthesia Transfer of Care Note  Patient: Thomas [name]  Procedure(s) Performed: ABDOMINAL AORTIC ENDOVASCULAR STENT GRAFT (Bilateral: Groin) ULTRASOUND GUIDANCE FOR VASCULAR ACCESS (Bilateral: Groin)  Patient Location: PACU  Anesthesia Type:General  Level of Consciousness: awake, oriented, and patient cooperative  Airway & Oxygen Therapy: Patient Spontanous Breathing  Post-op Assessment: Report given to RN and Post -op Vital signs reviewed and stable  Post vital signs: Reviewed and stable  Last Vitals:  Vitals Value Taken Time  BP 149/138 05/11/23 1519  Temp    Pulse 60 05/11/23 1521  Resp 16 05/11/23 1521  SpO2 97 % 05/11/23 1521  Vitals shown include unfiled device data.  Last Pain:  Vitals:   05/11/23 1017  TempSrc:   PainSc: 0-No pain         Complications: No notable events documented.

## 2023-05-11 NOTE — Anesthesia Procedure Notes (Signed)
Procedure Name: Intubation Date/Time: 05/11/2023 1:30 PM  Performed by: Sandie Ano, CRNAPre-anesthesia Checklist: Patient identified, Emergency Drugs available, Suction available and Patient being monitored Patient Re-evaluated:Patient Re-evaluated prior to induction Oxygen Delivery Method: Circle System Utilized Preoxygenation: Pre-oxygenation with 100% oxygen Induction Type: IV induction Ventilation: Mask ventilation without difficulty Laryngoscope Size: Mac and 3 Grade View: Grade I Tube type: Oral Tube size: 7.0 mm Number of attempts: 1 Airway Equipment and Method: Stylet and Oral airway Placement Confirmation: ETT inserted through vocal cords under direct vision, positive ETCO2 and breath sounds checked- equal and bilateral Secured at: 22 cm Tube secured with: Tape Dental Injury: Teeth and Oropharynx as per pre-operative assessment

## 2023-05-11 NOTE — Plan of Care (Signed)
Problem: Education: Goal: Knowledge of discharge needs will improve Outcome: Progressing   Problem: Clinical Measurements: Goal: Postoperative complications will be avoided or minimized Outcome: Progressing   Problem: Respiratory: Goal: Ability to achieve and maintain a regular respiratory rate will improve Outcome: Progressing   Problem: Skin Integrity: Goal: Demonstration of wound healing without infection will improve Outcome: Progressing

## 2023-05-11 NOTE — Anesthesia Procedure Notes (Signed)
Arterial Line Insertion Start/End9/18/2024 10:35 AM, 05/11/2023 10:40 AM Performed by: Waynard Edwards, CRNA, CRNA  Patient location: Pre-op. Preanesthetic checklist: patient identified, IV checked, site marked, risks and benefits discussed, surgical consent, monitors and equipment checked, pre-op evaluation, timeout performed and anesthesia consent Lidocaine 1% used for infiltration Left, radial was placed Catheter size: 20 G Hand hygiene performed , maximum sterile barriers used  and Seldinger technique used Allen's test indicative of satisfactory collateral circulation Attempts: 1 Procedure performed without using ultrasound guided technique. Following insertion, dressing applied and Biopatch. Post procedure assessment: normal and unchanged  Patient tolerated the procedure well with no immediate complications.

## 2023-05-12 ENCOUNTER — Encounter (HOSPITAL_COMMUNITY): Payer: Self-pay | Admitting: Surgery

## 2023-05-12 ENCOUNTER — Telehealth: Payer: Self-pay | Admitting: Surgery

## 2023-05-12 LAB — BASIC METABOLIC PANEL
Anion gap: 7 (ref 5–15)
BUN: 14 mg/dL (ref 8–23)
CO2: 22 mmol/L (ref 22–32)
Calcium: 8.4 mg/dL — ABNORMAL LOW (ref 8.9–10.3)
Chloride: 109 mmol/L (ref 98–111)
Creatinine, Ser: 1.05 mg/dL (ref 0.61–1.24)
GFR, Estimated: >60 mL/min (ref >60)
Glucose, Bld: 94 mg/dL (ref 70–99)
Potassium: 4.2 mmol/L (ref 3.5–5.1)
Sodium: 138 mmol/L (ref 135–145)

## 2023-05-12 LAB — CBC
HCT: 32.1 % — ABNORMAL LOW (ref 39.0–52.0)
Hemoglobin: 10.8 g/dL — ABNORMAL LOW (ref 13.0–17.0)
MCH: 31.7 pg (ref 26.0–34.0)
MCHC: 33.6 g/dL (ref 30.0–36.0)
MCV: 94.1 fL (ref 80.0–100.0)
Platelets: 166 10*3/uL (ref 150–400)
RBC: 3.41 MIL/uL — ABNORMAL LOW (ref 4.22–5.81)
RDW: 14.5 % (ref 11.5–15.5)
WBC: 6.7 10*3/uL (ref 4.0–10.5)
nRBC: 0 % (ref 0.0–0.2)

## 2023-05-12 MED ORDER — OXYCODONE HCL 5 MG PO TABS: 5.0000 mg | ORAL_TABLET | Freq: Four times a day (QID) | ORAL | 0 refills | Status: AC | PRN

## 2023-05-12 NOTE — Discharge Instructions (Signed)
[facility]   Discharge Instructions  Endovascular Aortic Aneurysm Repair  Please refer to the following instructions for your post-procedure care. Your surgeon or Physician Assistant will discuss any changes with you.  Activity  You are encouraged to walk as much as you can. You can slowly return to normal activities but must avoid strenuous activity and heavy lifting until your doctor tells you it's OK. Avoid activities such as vacuuming or swinging a gold club. It is normal to feel tired for several weeks after your surgery. Do not drive until your doctor gives the OK and you are no longer taking prescription pain medications. It is also normal to have difficulty with sleep habits, eating, and bowel movements after surgery. These will go away with time.  Bathing/Showering  You may shower after you go home. If you have an incision, do not soak in a bathtub, hot tub, or swim until the incision heals completely.  Incision Care  Shower every day. Clean your incision with mild soap and water. Pat the area dry with a clean towel. You do not need a bandage unless otherwise instructed. Do not apply any ointments or creams to your incision. If you clothing is irritating, you may cover your incision with a dry gauze pad.  Diet  Resume your normal diet. There are no special food restrictions following this procedure. A low fat/low cholesterol diet is recommended for all patients with vascular disease. In order to heal from your surgery, it is CRITICAL to get adequate nutrition. Your body requires vitamins, minerals, and protein. Vegetables are the best source of vitamins and minerals. Vegetables also provide the perfect balance of protein. Processed food has little nutritional value, so try to avoid this.  Medications  Resume taking all of your medications unless your doctor or nurse practitioner tells you not to. If your incision is causing pain, you may take  over-the-counter pain relievers such as acetaminophen (Tylenol). If you were prescribed a stronger pain medication, please be aware these medications can cause nausea and constipation. Prevent nausea by taking the medication with a snack or meal. Avoid constipation by drinking plenty of fluids and eating foods with a high amount of fiber, such as fruits, vegetables, and grains. Do not take Tylenol if you are taking prescription pain medications.   Follow up  Our office will schedule a follow-up appointment with a C.T. scan 3-4 weeks after your surgery.  Please call us immediately for any of the following conditions  Severe or worsening pain in your legs or feet or in your abdomen back or chest. Increased pain, redness, drainage (pus) from your incision sit. Increased abdominal pain, bloating, nausea, vomiting or persistent diarrhea. Fever of 101 degrees or higher. Swelling in your leg (s),  Reduce your risk of vascular disease  Stop smoking. If you would like help call [facility] at 1-800-QUIT-NOW ((947)168-0469) or [place] at [phone]. Manage your cholesterol Maintain a desired weight Control your diabetes Keep your blood pressure down  If you have questions, please call the office at [phone].

## 2023-05-12 NOTE — Progress Notes (Addendum)
  Progress Note    05/12/2023 7:42 AM 1 Day Post-Op  Subjective:  no complaints   Vitals:   05/12/23 0213 05/12/23 0406  BP: 129/68 114/67  Pulse:  71  Resp:  19  Temp:  98 F (36.7 C)  SpO2:  94%   Physical Exam: Lungs:  non labored Incisions:  groin incisions c/d/i Extremities:  brisk DP signals bilaterally Abdomen:  soft, NT, ND Neurologic: A&O  CBC    Component Value Date/Time   WBC 6.7 05/12/2023 0505   RBC 3.41 (L) 05/12/2023 0505   HGB 10.8 (L) 05/12/2023 0505   HCT 32.1 (L) 05/12/2023 0505   PLT 166 05/12/2023 0505   MCV 94.1 05/12/2023 0505   MCH 31.7 05/12/2023 0505   MCHC 33.6 05/12/2023 0505   RDW 14.5 05/12/2023 0505   LYMPHSABS 1.1 04/09/2023 0625   MONOABS 0.6 04/09/2023 0625   EOSABS 0.0 04/09/2023 0625   BASOSABS 0.0 04/09/2023 0625    BMET    Component Value Date/Time   NA 138 05/12/2023 0505   K 4.2 05/12/2023 0505   CL 109 05/12/2023 0505   CO2 22 05/12/2023 0505   GLUCOSE 94 05/12/2023 0505   BUN 14 05/12/2023 0505   CREATININE 1.05 05/12/2023 0505   CALCIUM 8.4 (L) 05/12/2023 0505   GFRNONAA >60 05/12/2023 0505   GFRAA >60 10/03/2019 0153    INR    Component Value Date/Time   INR 1.0 05/11/2023 1045     Intake/Output Summary (Last 24 hours) at 05/12/2023 0742 Last data filed at 05/12/2023 0500 Gross per 24 hour  Intake 2031.73 ml  Output 1100 ml  Net 931.73 ml     Assessment/Plan:  82 y.o. male is s/p EVAR 1 Day Post-Op   BLE well perfused by doppler exam Abd is soft and non tender; pain well controlled D/c foley OOB Ok for discharge today; office will arrange CTA in 1 month    Emilie Rutter, PA-C [facility] [phone] 05/12/2023 7:42 AM   Agree with the above.  Looks great this am.  D/c home  Wells Bethan Adamek

## 2023-05-12 NOTE — Progress Notes (Signed)
Discharge instructions reviewed with pt.  Copy of instructions given to pt. Pt informed script sent to his pharmacy for pick up. Pt getting dressed and ride has been called and will be here at approx 11:00am.  Pt to be d/c'd via wheelchair with belongings, with daughter n law picking pt up at main entrance, pt will go to discharge lounge if pt ride is not here before taking down to main entrance.            Escorted by staff.  Annice Needy, SWOT

## 2023-05-12 NOTE — Plan of Care (Signed)

## 2023-05-12 NOTE — Anesthesia Postprocedure Evaluation (Signed)
Anesthesia Post Note  Patient: Thomas [name]  Procedure(s) Performed: ABDOMINAL AORTIC ENDOVASCULAR STENT GRAFT (Bilateral: Groin) ULTRASOUND GUIDANCE FOR VASCULAR ACCESS (Bilateral: Groin)     Patient location during evaluation: PACU Anesthesia Type: General Level of consciousness: awake and alert Pain management: pain level controlled Vital Signs Assessment: post-procedure vital signs reviewed and stable Respiratory status: spontaneous breathing, nonlabored ventilation, respiratory function stable and patient connected to nasal cannula oxygen Cardiovascular status: blood pressure returned to baseline and stable Postop Assessment: no apparent nausea or vomiting Anesthetic complications: no   No notable events documented.  Last Vitals:  Vitals:   05/12/23 0406 05/12/23 0806  BP: 114/67   Pulse: 71 62  Resp: 19   Temp: 36.7 C 36.7 C  SpO2: 94% 98%    Last Pain:  Vitals:   05/12/23 0806  TempSrc: Oral  PainSc:    Pain Goal:                   Collene Schlichter

## 2023-05-12 NOTE — Telephone Encounter (Signed)
Pt currently in hospital. Will reach out to schedule appt after d/c.

## 2023-05-16 ENCOUNTER — Telehealth: Payer: Self-pay | Admitting: Surgery

## 2023-05-16 ENCOUNTER — Encounter: Payer: Self-pay | Admitting: Surgery

## 2023-05-16 NOTE — Telephone Encounter (Signed)
Appt has been scheduled.

## 2023-05-16 NOTE — Discharge Summary (Signed)
[facility] Discharge Summary   Thomas [name] [birth year] 82 y.o. male  MRN: [id number]  Admission Date: 05/11/2023  Discharge Date: 05/12/23  Physician: Dr. Myra Gianotti  Admission Diagnosis: AAA (abdominal aortic aneurysm) [facility]) [I71.40] S/P AAA repair [W29.562, Z86.79]  Discharge Day services:    See progress note 05/12/23  Hospital Course:  Thomas [name] is an 82 year old male who was brought in as an outpatient and underwent endovascular repair of abdominal aortic aneurysm by Dr. Myra Gianotti on 05/11/2023.  He tolerated the procedure well and was admitted to the hospital postoperatively.  POD #1 both groin incisions were well-appearing.  Bilateral lower extremities were well-perfused with brisk DP signals.  He was ready for discharge home.  He will follow-up in office in 1 month with a CTA abdomen and pelvis.  He was discharged home in stable condition.  CBC    Component Value Date/Time   WBC 6.7 05/12/2023 0505   RBC 3.41 (L) 05/12/2023 0505   HGB 10.8 (L) 05/12/2023 0505   HCT 32.1 (L) 05/12/2023 0505   PLT 166 05/12/2023 0505   MCV 94.1 05/12/2023 0505   MCH 31.7 05/12/2023 0505   MCHC 33.6 05/12/2023 0505   RDW 14.5 05/12/2023 0505   LYMPHSABS 1.1 04/09/2023 0625   MONOABS 0.6 04/09/2023 0625   EOSABS 0.0 04/09/2023 0625   BASOSABS 0.0 04/09/2023 0625    BMET    Component Value Date/Time   NA 138 05/12/2023 0505   K 4.2 05/12/2023 0505   CL 109 05/12/2023 0505   CO2 22 05/12/2023 0505   GLUCOSE 94 05/12/2023 0505   BUN 14 05/12/2023 0505   CREATININE 1.05 05/12/2023 0505   CALCIUM 8.4 (L) 05/12/2023 0505   GFRNONAA >60 05/12/2023 0505   GFRAA >60 10/03/2019 0153         Discharge Diagnosis:  AAA (abdominal aortic aneurysm) (HCC) [I71.40] S/P AAA repair [Z30.865, Z86.79]  Secondary Diagnosis: Patient Active Problem List   Diagnosis Date Noted   AAA (abdominal aortic aneurysm) (HCC) 05/11/2023   S/P AAA repair 05/11/2023   History of post-polio  syndrome - left leg 04/08/2023   Syncope 04/08/2023   Diarrhea 04/08/2023   AKI (acute kidney injury) (HCC) 04/08/2023   Essential hypertension 04/08/2023   Erectile dysfunction due to arterial insufficiency 06/16/2020   AAA (abdominal aortic aneurysm) without rupture (HCC) 10/18/2019   Hip fracture (HCC) 10/01/2019   Nocturnal leg cramps 09/27/2014   S/P lumbar microdiscectomy 11/07/2013   Lumbar disc disease 10/29/2013   CAD (coronary artery disease)    Obesity (BMI 30-39.9)    Pupil diameter unequal    Hyperlipidemia    Hypertensive heart disease    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 Valentina Lucks follows-checks every 2 yrs   Allergic asthma    Allergic rhinitis    Anxiety depression   Arthritis    torn rotator cuff left shoulder   Bruise    left chest area"recent fall"-no rib fracture identified.   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   Closed left hip fracture, initial encounter (HCC) 10/01/2019   Colitis    GERD (gastroesophageal reflux disease)    History of measles    History of mumps    HLD (hyperlipidemia)  HTN (hypertension)    Obesity    Polio    left leg   Pupil irregularity, right    old injury"pupil will not contract to light"   Shortness of breath    exertion, asthma attack     Allergies as of 05/12/2023       Reactions   Advair Hfa [fluticasone-salmeterol] Hives   Aspirin Hives   Can take 81 mg   Codeine Hives    whelps, Hydrocodone- ok, no problemHy   Hydrochlorothiazide Rash   Ibuprofen Rash   Keflex [cephalexin] Rash   Latex Rash   Montelukast Sodium Rash   Penicillins Rash   Statins Rash   Sulfonamide Derivatives Rash   Tape Rash   Other Reaction(s): rash and blisters        Medication List      TAKE these medications    aspirin EC 81 MG tablet Take 81 mg by mouth daily.   ezetimibe 10 MG tablet Commonly known as: ZETIA TAKE 1 TABLET BY MOUTH EVERY DAY   fexofenadine 180 MG tablet Commonly known as: ALLEGRA Take 180 mg by mouth daily.   furosemide 20 MG tablet Commonly known as: LASIX Take 20 mg by mouth daily as needed for fluid.   lisinopril 30 MG tablet Commonly known as: ZESTRIL Take 30 mg by mouth daily.   loperamide 2 MG capsule Commonly known as: IMODIUM Take 1 capsule (2 mg total) by mouth every 4 (four) hours as needed for diarrhea or loose stools.   oxyCODONE 5 MG immediate release tablet Commonly known as: Oxy IR/ROXICODONE Take 1 tablet (5 mg total) by mouth every 6 (six) hours as needed for severe pain.   predniSONE 5 MG tablet Commonly known as: DELTASONE Take 5 mg by mouth daily as needed (Asthma symptoms).   ProAir HFA 108 (90 Base) MCG/ACT inhaler Generic drug: albuterol Inhale 2 puffs into the lungs every 4 (four) hours as needed for wheezing.   Symbicort 160-4.5 MCG/ACT inhaler Generic drug: budesonide-formoterol Inhale 2 puffs into the lungs 2 (two) times daily.        Discharge Instructions:   [facility]  Discharge Instructions Endovascular Aortic Aneurysm Repair  Please refer to the following instructions for your post-procedure care. Your surgeon or Physician Assistant will discuss any changes with you.  Activity  You are encouraged to walk as much as you can. You can slowly return to normal activities but must avoid strenuous activity and heavy lifting until your doctor tells you it's OK. Avoid activities such as vacuuming or swinging a gold club. It is normal to feel tired for several weeks after your surgery. Do not drive until your doctor gives the OK and you are no longer taking prescription pain medications. It is also normal to have difficulty with sleep habits, eating, and bowel  movements after surgery. These will go away with time.  Bathing/Showering  You may shower after you go home. If you have an incision, do not soak in a bathtub, hot tub, or swim until the incision heals completely.  Incision Care  Shower every day. Clean your incision with mild soap and water. Pat the area dry with a clean towel. You do not need a bandage unless otherwise instructed. Do not apply any ointments or creams to your incision. If you clothing is irritating, you may cover your incision with a dry gauze pad.  Diet  Resume your normal diet. There are no special food restrictions following this  procedure. A low fat/low cholesterol diet is recommended for all patients with vascular disease. In order to heal from your surgery, it is CRITICAL to get adequate nutrition. Your body requires vitamins, minerals, and protein. Vegetables are the best source of vitamins and minerals. Vegetables also provide the perfect balance of protein. Processed food has little nutritional value, so try to avoid this.  Medications  Resume taking all of your medications unless your doctor or Physician Assistnat tells you not to. If your incision is causing pain, you may take over-the-counter pain relievers such as acetaminophen (Tylenol). If you were prescribed a stronger pain medication, please be aware these medications can cause nausea and constipation. Prevent nausea by taking the medication with a snack or meal. Avoid constipation by drinking plenty of fluids and eating foods with a high amount of fiber, such as fruits, vegetables, and grains. Do not take Tylenol if you are taking prescription pain medications.   Follow up  Our office will schedule a follow-up appointment with a C.T. scan 3-4 weeks after your surgery.  Please call us immediately for any of the following conditions  Severe or worsening pain in your legs or feet or in your abdomen back or chest. Increased pain, redness, drainage (pus) from  your incision sit. Increased abdominal pain, bloating, nausea, vomiting or persistent diarrhea. Fever of 101 degrees or higher. Swelling in your leg (s),  Reduce your risk of vascular disease  Stop smoking. If you would like help call [facility] at 1-800-QUIT-NOW ((979)327-6794) or [place] at [phone]. Manage your cholesterol Maintain a desired weight Control your diabetes Keep your blood pressure down  If you have questions, please call the office at [phone].     Disposition: home  Patient's condition: is Good  Follow up: 1. Dr. Myra Gianotti in 4 weeks with CTA protocol   Emilie Rutter, PA-C [facility] [phone] 05/16/2023  9:08 AM   - For VQI Registry use - Post-op:  Time to Extubation: [x]  In OR, [ ]  < 12 hrs, [ ]  12-24 hrs, [ ]  >=24 hrs Vasopressors Req. Post-op: No MI: No., [ ]  Troponin only, [ ]  EKG or Clinical New Arrhythmia: No CHF: No ICU Stay: 0 days Transfusion: No   Complications: Resp failure: No., [ ]  Pneumonia, [ ]  Ventilator Chg in renal function: No., [ ]  Inc. Cr > 0.5, [ ]  Temp. Dialysis,  [ ]  Permanent dialysis Leg ischemia: No., no Surgery needed, [ ]  Yes, Surgery needed,  [ ]  Amputation Bowel ischemia: No., [ ]  Medical Rx, [ ]  Surgical Rx Wound complication: No., [ ]  Superficial separation/infection, [ ]  Return to OR Return to OR: No  Return to OR for bleeding: No Stroke: No., [ ]  Minor, [ ]  Major  Discharge medications: Statin use:  No  ASA use:  Yes  Plavix use:  No  Beta blocker use:  No  ARB use:  No ACEI use:  Yes CCB use:  No

## 2023-05-16 NOTE — Telephone Encounter (Signed)
Pt appt scheduled

## 2023-05-26 ENCOUNTER — Telehealth: Payer: Self-pay

## 2023-05-26 NOTE — Telephone Encounter (Signed)
Pt called stating that he noticed a slight tint of blood at his incision this morning.  Reviewed pt's chart, returned call for clarification, two identifiers used. Pt stated that there was a tiny spot of blood at his R groin he noticed earlier. He denied any pain and there was only a slight bit of redness. Most likely a scab that came off, but pt is taking great care of his sites. Pt instructed to monitor and call if any new or worsening symptoms develop. Confirmed understanding.

## 2023-06-02 ENCOUNTER — Other Ambulatory Visit: Payer: Self-pay

## 2023-06-02 DIAGNOSIS — I7143 Infrarenal abdominal aortic aneurysm, without rupture: Secondary | ICD-10-CM

## 2023-06-15 ENCOUNTER — Ambulatory Visit
Admission: RE | Admit: 2023-06-15 | Discharge: 2023-06-15 | Disposition: A | Discharge status: Home / Self Care | Payer: Medicare Other | Source: Ambulatory Visit | Attending: Surgery | Admitting: Surgery

## 2023-06-15 DIAGNOSIS — I700 Atherosclerosis of aorta: Secondary | ICD-10-CM | POA: Diagnosis not present

## 2023-06-15 DIAGNOSIS — I7143 Infrarenal abdominal aortic aneurysm, without rupture: Secondary | ICD-10-CM | POA: Diagnosis not present

## 2023-06-15 DIAGNOSIS — K8020 Calculus of gallbladder without cholecystitis without obstruction: Secondary | ICD-10-CM | POA: Diagnosis not present

## 2023-06-15 DIAGNOSIS — K5730 Diverticulosis of large intestine without perforation or abscess without bleeding: Secondary | ICD-10-CM | POA: Diagnosis not present

## 2023-06-15 MED ORDER — IOPAMIDOL (ISOVUE-370) INJECTION 76%
500.0000 mL | Freq: Once | INTRAVENOUS | Status: AC | PRN
  Administered 2023-06-15: 93 mL via INTRAVENOUS

## 2023-06-20 DIAGNOSIS — R600 Localized edema: Secondary | ICD-10-CM | POA: Diagnosis not present

## 2023-06-20 DIAGNOSIS — I10 Essential (primary) hypertension: Secondary | ICD-10-CM | POA: Diagnosis not present

## 2023-06-20 DIAGNOSIS — K5190 Ulcerative colitis, unspecified, without complications: Secondary | ICD-10-CM | POA: Diagnosis not present

## 2023-06-20 DIAGNOSIS — I2510 Atherosclerotic heart disease of native coronary artery without angina pectoris: Secondary | ICD-10-CM | POA: Diagnosis not present

## 2023-06-20 DIAGNOSIS — J449 Chronic obstructive pulmonary disease, unspecified: Secondary | ICD-10-CM | POA: Diagnosis not present

## 2023-06-20 DIAGNOSIS — Z789 Other specified health status: Secondary | ICD-10-CM | POA: Diagnosis not present

## 2023-06-20 DIAGNOSIS — I714 Abdominal aortic aneurysm, without rupture, unspecified: Secondary | ICD-10-CM | POA: Diagnosis not present

## 2023-06-20 DIAGNOSIS — E7800 Pure hypercholesterolemia, unspecified: Secondary | ICD-10-CM | POA: Diagnosis not present

## 2023-06-20 DIAGNOSIS — Z79899 Other long term (current) drug therapy: Secondary | ICD-10-CM | POA: Diagnosis not present

## 2023-06-20 DIAGNOSIS — L291 Pruritus scroti: Secondary | ICD-10-CM | POA: Diagnosis not present

## 2023-06-23 ENCOUNTER — Encounter: Payer: Self-pay | Admitting: Surgery

## 2023-06-27 ENCOUNTER — Encounter: Payer: Self-pay | Admitting: Surgery

## 2023-06-27 ENCOUNTER — Ambulatory Visit (INDEPENDENT_AMBULATORY_CARE_PROVIDER_SITE_OTHER): Payer: Medicare Other | Admitting: Surgery

## 2023-06-27 VITALS — BP 190/88 | HR 67 | Temp 98.3°F | Resp 20 | Ht 68.0 in | Wt 192.0 lb

## 2023-06-27 DIAGNOSIS — I7143 Infrarenal abdominal aortic aneurysm, without rupture: Secondary | ICD-10-CM

## 2023-06-27 NOTE — Progress Notes (Signed)
Patient name: Thomas [name] MRN: [id number] DOB: 21-Jan-1941 Sex: male  REASON FOR VISIT:    Postop  HISTORY OF PRESENT ILLNESS:   Thomas [name] is a 82 y.o. male vascular repair of a 5.3 cm infrarenal abdominal aortic aneurysm on 05/11/2023.  His postoperative course was uncomplicated.  He has no complaints today.   The patient is a former smoker.  He is medically managed for hypertension.  History of coronary artery disease, status post CABG in 2006   CURRENT MEDICATIONS:    Current Outpatient Medications  Medication Sig Dispense Refill   aspirin EC 81 MG tablet Take 81 mg by mouth daily.     budesonide-formoterol (SYMBICORT) 160-4.5 MCG/ACT inhaler Inhale 2 puffs into the lungs 2 (two) times daily.     ezetimibe (ZETIA) 10 MG tablet TAKE 1 TABLET BY MOUTH EVERY DAY 90 tablet 3   fexofenadine (ALLEGRA) 180 MG tablet Take 180 mg by mouth daily.     furosemide (LASIX) 20 MG tablet Take 20 mg by mouth daily as needed for fluid.     lisinopril (ZESTRIL) 30 MG tablet Take 30 mg by mouth daily.     loperamide (IMODIUM) 2 MG capsule Take 1 capsule (2 mg total) by mouth every 4 (four) hours as needed for diarrhea or loose stools. 30 capsule 0   oxyCODONE (OXY IR/ROXICODONE) 5 MG immediate release tablet Take 1 tablet (5 mg total) by mouth every 6 (six) hours as needed for severe pain. 15 tablet 0   predniSONE (DELTASONE) 5 MG tablet Take 5 mg by mouth daily as needed (Asthma symptoms).     PROAIR HFA 108 (90 Base) MCG/ACT inhaler Inhale 2 puffs into the lungs every 4 (four) hours as needed for wheezing.      No current facility-administered medications for this visit.    REVIEW OF SYSTEMS:   [X]  denotes positive finding, [ ]  denotes negative finding Cardiac  Comments:  Chest pain or chest pressure:    Shortness of breath upon exertion:    Short of breath when lying flat:    Irregular heart rhythm:    Constitutional    Fever or chills:       PHYSICAL EXAM:   Vitals:   06/27/23 1344  BP: (!) 190/88  Pulse: 67  Resp: 20  Temp: 98.3 F (36.8 C)  SpO2: 96%  Weight: 192 lb (87.1 kg)  Height: 5\' 8"  (1.727 m)    GENERAL: The patient is a well-nourished male, in no acute distress. The vital signs are documented above. CARDIOVASCULAR: There is a regular rate and rhythm. PULMONARY: Non-labored respirations Groin incisions are healing nicely  STUDIES:   CT angiogram:  Vascular Impression:   1. Post uncomplicated endovascular repair of infrarenal abdominal aortic aneurysm. The excluded native abdominal aortic aneurysm sac is unchanged measuring 5.1 cm in maximal diameter. No evidence of an endoleak. Aortic aneurysm NOS (ICD10-I71.9). 2.  Aortic Atherosclerosis (ICD10-I70.0).   Nonvascular Impression:   1. No acute findings within the abdomen or pelvis. 2. Cholelithiasis without evidence of acute cholecystitis. 3. Rather extensive colonic diverticulosis without evidence of superimposed acute diverticulitis.      MEDICAL ISSUES:   Status post endovascular aneurysm repair: CT scan shows no evidence of endoleak.  Maximum diameter is unchanged at 5.1 cm.  He is doing very well at this time.  He will follow-up in 6 months with ultrasound surveillance.  Charlena Cross, MD, FACS [facility] [phone]  Pager [phone]

## 2023-07-14 ENCOUNTER — Other Ambulatory Visit: Payer: Self-pay

## 2023-07-14 DIAGNOSIS — I7143 Infrarenal abdominal aortic aneurysm, without rupture: Secondary | ICD-10-CM

## 2023-08-08 DIAGNOSIS — M79671 Pain in right foot: Secondary | ICD-10-CM | POA: Diagnosis not present

## 2023-08-08 DIAGNOSIS — L603 Nail dystrophy: Secondary | ICD-10-CM | POA: Diagnosis not present

## 2023-08-08 DIAGNOSIS — M79672 Pain in left foot: Secondary | ICD-10-CM | POA: Diagnosis not present

## 2023-08-08 DIAGNOSIS — I739 Peripheral vascular disease, unspecified: Secondary | ICD-10-CM | POA: Diagnosis not present

## 2023-08-08 DIAGNOSIS — L84 Corns and callosities: Secondary | ICD-10-CM | POA: Diagnosis not present

## 2023-09-08 DIAGNOSIS — S81812A Laceration without foreign body, left lower leg, initial encounter: Secondary | ICD-10-CM | POA: Diagnosis not present

## 2023-09-08 DIAGNOSIS — J449 Chronic obstructive pulmonary disease, unspecified: Secondary | ICD-10-CM | POA: Diagnosis not present

## 2023-09-08 DIAGNOSIS — K5190 Ulcerative colitis, unspecified, without complications: Secondary | ICD-10-CM | POA: Diagnosis not present

## 2023-09-08 DIAGNOSIS — R600 Localized edema: Secondary | ICD-10-CM | POA: Diagnosis not present

## 2023-09-08 DIAGNOSIS — I10 Essential (primary) hypertension: Secondary | ICD-10-CM | POA: Diagnosis not present

## 2023-09-26 DIAGNOSIS — K5190 Ulcerative colitis, unspecified, without complications: Secondary | ICD-10-CM | POA: Diagnosis not present

## 2023-09-26 DIAGNOSIS — M25511 Pain in right shoulder: Secondary | ICD-10-CM | POA: Diagnosis not present

## 2023-09-26 DIAGNOSIS — I10 Essential (primary) hypertension: Secondary | ICD-10-CM | POA: Diagnosis not present

## 2023-09-26 DIAGNOSIS — J449 Chronic obstructive pulmonary disease, unspecified: Secondary | ICD-10-CM | POA: Diagnosis not present

## 2023-09-26 DIAGNOSIS — S81812A Laceration without foreign body, left lower leg, initial encounter: Secondary | ICD-10-CM | POA: Diagnosis not present

## 2023-09-26 DIAGNOSIS — R600 Localized edema: Secondary | ICD-10-CM | POA: Diagnosis not present

## 2023-09-29 ENCOUNTER — Encounter (HOSPITAL_BASED_OUTPATIENT_CLINIC_OR_DEPARTMENT_OTHER): Payer: Self-pay | Admitting: Emergency Medicine

## 2023-09-29 ENCOUNTER — Other Ambulatory Visit (HOSPITAL_BASED_OUTPATIENT_CLINIC_OR_DEPARTMENT_OTHER): Payer: Self-pay

## 2023-09-29 ENCOUNTER — Other Ambulatory Visit: Payer: Self-pay

## 2023-09-29 ENCOUNTER — Emergency Department (HOSPITAL_BASED_OUTPATIENT_CLINIC_OR_DEPARTMENT_OTHER): Payer: Medicare Other | Admitting: Radiology

## 2023-09-29 ENCOUNTER — Emergency Department (HOSPITAL_BASED_OUTPATIENT_CLINIC_OR_DEPARTMENT_OTHER)
Admission: EM | Admit: 2023-09-29 | Discharge: 2023-09-29 | Disposition: A | Discharge status: Home / Self Care | Payer: Medicare Other | Attending: Emergency Medicine | Admitting: Emergency Medicine

## 2023-09-29 DIAGNOSIS — Z955 Presence of coronary angioplasty implant and graft: Secondary | ICD-10-CM | POA: Diagnosis not present

## 2023-09-29 DIAGNOSIS — Z91040 Latex allergy status: Secondary | ICD-10-CM | POA: Diagnosis not present

## 2023-09-29 DIAGNOSIS — Z79899 Other long term (current) drug therapy: Secondary | ICD-10-CM | POA: Insufficient documentation

## 2023-09-29 DIAGNOSIS — I10 Essential (primary) hypertension: Secondary | ICD-10-CM | POA: Insufficient documentation

## 2023-09-29 DIAGNOSIS — M25511 Pain in right shoulder: Secondary | ICD-10-CM | POA: Diagnosis not present

## 2023-09-29 DIAGNOSIS — Z7982 Long term (current) use of aspirin: Secondary | ICD-10-CM | POA: Insufficient documentation

## 2023-09-29 DIAGNOSIS — M19011 Primary osteoarthritis, right shoulder: Secondary | ICD-10-CM | POA: Diagnosis not present

## 2023-09-29 MED ORDER — PREDNISONE 10 MG PO TABS
40.0000 mg | ORAL_TABLET | Freq: Every day | ORAL | 0 refills | Status: DC
  Filled 2023-09-29: qty 20, 5d supply, fill #0

## 2023-09-29 NOTE — Discharge Instructions (Signed)
 Take the prednisone  as directed.  Sent to the pharmacy here.  Make an appointment to follow-up with your orthopedic group.  X-rays here today did show some degenerative changes in the right shoulder could be responsible for the pain.  You can also take extra strength Tylenol  2 tablets every 8 hours with the prednisone .  As we discussed the sling will not work for you because you need to use your walker.

## 2023-09-29 NOTE — ED Notes (Addendum)
 Pt alert and oriented X 4 at the time of discharge. RR even and unlabored. No acute distress noted. Pt verbalized understanding of discharge instructions as discussed. Pt ambulatory to lobby at time of discharge with walker.

## 2023-09-29 NOTE — ED Provider Notes (Addendum)
 [place] EMERGENC[facility] DEPARTMENT AT [facility] Provider Note   CSN: [id number] Arrival date & time: 09/29/23  9352     History  Chief Complaint  Patient presents with   Arm Pain    Thomas [name] is a 83 y.o. male.  Patient with a complaint of right shoulder pain for the past week.  Started on top of the shoulder now its kind and radiating down to the upper part of the arm and a little bit towards the back.  No fall or injury.  In 2018 patient had rotator cuff surgery on that right shoulder.  Patient saw his primary care doctor for gave him extra strength Tylenol  but it is not helping.  Patient currently not on any steroids.  Past medical history significant for coronary disease hyperlipidemia hypertension abdominal aortic aneurysm close left hip fracture in 2021.  Patient's had coronary artery bypass graft.  That was in 06.  Hip pinning on the left abdominal aortic endovascular stent in September 2024.  Patient is a former smoker quit 1994.  Patient denies any numbness or weakness to the right hand.  It is painful to use the shoulder.       Home Medications Prior to Admission medications   Medication Sig Start Date End Date Taking? Authorizing Provider  escitalopram (LEXAPRO) 10 MG tablet Take 10 mg by mouth daily. 06/15/23  Yes [provider]  aspirin  EC 81 MG tablet Take 81 mg by mouth daily.    [provider]  budesonide-formoterol  (SYMBICORT) 160-4.5 MCG/ACT inhaler Inhale 2 puffs into the lungs 2 (two) times daily. 11/04/21   [provider]  ezetimibe  (ZETIA ) 10 MG tablet TAKE 1 TABLET BY MOUTH EVERY DAY 07/13/21   O'Neal, Darryle Ned, MD  fexofenadine (ALLEGRA) 180 MG tablet Take 180 mg by mouth daily.    [provider]  furosemide (LASIX) 20 MG tablet Take 20 mg by mouth daily as needed for fluid. 06/17/22   [provider]  lisinopril  (ZESTRIL ) 30 MG tablet Take 30 mg by mouth daily. 03/01/23   [provider]  loperamide  (IMODIUM ) 2 MG capsule Take 1 capsule (2 mg total) by mouth every 4 (four) hours as needed for diarrhea or loose stools. 04/11/23   Caleen Qualia, MD  oxyCODONE  (OXY IR/ROXICODONE ) 5 MG immediate release tablet Take 1 tablet (5 mg total) by mouth every 6 (six) hours as needed for severe pain. 05/12/23   Bethanie Cough, PA-C  predniSONE  (DELTASONE ) 5 MG tablet Take 5 mg by mouth daily as needed (Asthma symptoms). 06/15/21   [provider]  PROAIR  HFA 108 (90 Base) MCG/ACT inhaler Inhale 2 puffs into the lungs every 4 (four) hours as needed for wheezing.  09/19/19   [provider]      Allergies    Advair hfa [fluticasone -salmeterol], Aspirin , Codeine, Hydrochlorothiazide, Ibuprofen, Keflex [cephalexin], Latex, Montelukast sodium, Penicillins, Statins, Sulfonamide derivatives, and Tape    Review of Systems   Review of Systems  Constitutional:  Negative for chills and fever.  HENT:  Negative for ear pain and sore throat.   Eyes:  Negative for pain and visual disturbance.  Respiratory:  Negative for cough and shortness of breath.   Cardiovascular:  Negative for chest pain and palpitations.  Gastrointestinal:  Negative for abdominal pain and vomiting.  Genitourinary:  Negative for dysuria and hematuria.  Musculoskeletal:  Negative for arthralgias and back pain.  Skin:  Negative for color change and rash.  Neurological:  Negative for  seizures and syncope.  All other systems reviewed and are negative.   Physical Exam Updated Vital Signs BP (!) 194/84 (BP Location: Right Arm)   Pulse 70   Temp 97.8 F (36.6 C)   Resp 20   Wt 87.1 kg   SpO2 98%   BMI 29.19 kg/m  Physical Exam Vitals and nursing note reviewed.  Constitutional:      General: He is not in acute distress.    Appearance: Normal appearance. He is well-developed.  HENT:     Head: Normocephalic and atraumatic.  Eyes:     Conjunctiva/sclera: Conjunctivae normal.  Cardiovascular:     Rate  and Rhythm: Normal rate and regular rhythm.     Heart sounds: No murmur heard. Pulmonary:     Effort: Pulmonary effort is normal. No respiratory distress.     Breath sounds: Normal breath sounds.  Abdominal:     Palpations: Abdomen is soft.     Tenderness: There is no abdominal tenderness.  Musculoskeletal:        General: No swelling or deformity.     Cervical back: Neck supple.     Comments: Distally radial pulses 2+.  Neurovascularly intact.  Good movement at the wrist and elbow.  Limited movement at the right shoulder due to pain.  No obvious deformity.  Skin:    General: Skin is warm and dry.     Capillary Refill: Capillary refill takes less than 2 seconds.  Neurological:     General: No focal deficit present.     Mental Status: He is alert and oriented to person, place, and time.  Psychiatric:        Mood and Affect: Mood normal.     ED Results / Procedures / Treatments   Labs (all labs ordered are listed, but only abnormal results are displayed) Labs Reviewed - No data to display  EKG EKG Interpretation Date/Time:  Thursday September 29 2023 07:15:07 EST Ventricular Rate:  70 PR Interval:    QRS Duration:  80 QT Interval:  374 QTC Calculation: 403 R Axis:   39  Text Interpretation: Accelerated Junctional rhythm Possible Lateral infarct , age undetermined Abnormal ECG When compared with ECG of 08-Apr-2023 21:47, PREVIOUS ECG IS PRESENT Confirmed by Chelcie Estorga [phone]) on 09/29/2023 9:33:37 AM  Radiology No results found.  Procedures Procedures    Medications Ordered in ED Medications - No data to display  ED Course/ Medical Decision Making/ A&P                                 Medical Decision Making Amount and/or Complexity of Data Reviewed Radiology: ordered.   Will get x-ray of right shoulder. X-ray of the right shoulder shows no fracture or dislocation.  Does show some degenerative changes.  Which is probably responsible for the discomfort.   Using a sling for him is not going be very helpful because he needs a roller walker to get around.  Will try a course of some prednisone  for him and have him follow-up with his orthopedic doctor.   Final Clinical Impression(s) / ED Diagnoses Final diagnoses:  Acute pain of right shoulder    Rx / DC Orders ED Discharge Orders     None         Geraldene Hamilton, MD 09/29/23 1003    Geraldene Hamilton, MD 09/29/23 [phone]

## 2023-09-29 NOTE — ED Triage Notes (Signed)
 Pt c/o RT posterior shoulder pain x 1.5 weeks.. Tx by pcp x 4 days pta. Pain has worsened and now includes arm pain. Pt denies CP or shob. 500 mg tylenol  pta

## 2023-10-06 ENCOUNTER — Encounter (HOSPITAL_COMMUNITY): Payer: Self-pay | Admitting: Surgery

## 2023-10-28 ENCOUNTER — Encounter (HOSPITAL_BASED_OUTPATIENT_CLINIC_OR_DEPARTMENT_OTHER): Payer: Self-pay

## 2023-10-30 NOTE — Progress Notes (Unsigned)
  Cardiology Office Note:  .   Date:  10/30/2023  ID:  Thomas [name], DOB 11-11-1940, MRN [id number] PCP: Emilio Aspen, MD  [facility] Providers Cardiologist:  Reatha Harps, MD { Click to update primary MD,subspecialty MD or APP then REFRESH:1}   History of Present Illness: .   No chief complaint on file.   Thomas [name] is a 83 y.o. male with history of CAD, AAA, HTN, Hld who presents for follow-up.      Problem List 1. AAA -s/p EVAR 05/11/2023 2. CAD -CABG x 4 2006 ([place]) -LIMA-LAD, SVG-RI, SVG-OM, SVG-PDA 3. HTN 4. HLD -T chol 125, HDL 44, LDL 63, TG 101 -statin intolerant  5. Prior tobacco abuse     ROS: All other ROS reviewed and negative. Pertinent positives noted in the HPI.     Studies Reviewed: .        Carotid US 05/02/2023 Summary:  Right Carotid: Velocities in the right ICA are consistent with a 1-39%  stenosis.   Left Carotid: Velocities in the left ICA are consistent with a 1-39%  stenosis.  Physical Exam:   VS:  There were no vitals taken for this visit.   Wt Readings from Last 3 Encounters:  09/29/23 192 lb (87.1 kg)  06/27/23 192 lb (87.1 kg)  05/11/23 185 lb (83.9 kg)    GEN: Well nourished, well developed in no acute distress NECK: No JVD; No carotid bruits CARDIAC: ***RRR, no murmurs, rubs, gallops RESPIRATORY:  Clear to auscultation without rales, wheezing or rhonchi  ABDOMEN: Soft, non-tender, non-distended EXTREMITIES:  No edema; No deformity  ASSESSMENT AND PLAN: .   ***    {Are you ordering a CV Procedure (e.g. stress test, cath, DCCV, TEE, etc)?   Press F2        :[id number]}   Follow-up: No follow-ups on file.  Time Spent with Patient: I have spent a total of *** minutes caring for this patient today face to face, ordering and reviewing labs/tests, reviewing prior records/medical history, examining the patient, establishing an assessment and plan, communicating results/findings to the patient/family,  and documenting in the medical record.   Signed, Lenna Gilford. Flora Lipps, MD, [facility]  [facility]  [address] [place], [place] [address] [phone]  9:16 PM

## 2023-10-31 ENCOUNTER — Encounter: Payer: Self-pay | Admitting: Cardiovascular Disease

## 2023-10-31 ENCOUNTER — Ambulatory Visit: Payer: Medicare Other | Attending: Cardiovascular Disease | Admitting: Cardiovascular Disease

## 2023-10-31 VITALS — BP 186/84 | HR 89 | Ht 67.0 in | Wt 195.6 lb

## 2023-10-31 DIAGNOSIS — I25810 Atherosclerosis of coronary artery bypass graft(s) without angina pectoris: Secondary | ICD-10-CM

## 2023-10-31 DIAGNOSIS — E782 Mixed hyperlipidemia: Secondary | ICD-10-CM | POA: Diagnosis not present

## 2023-10-31 DIAGNOSIS — I714 Abdominal aortic aneurysm, without rupture, unspecified: Secondary | ICD-10-CM

## 2023-10-31 DIAGNOSIS — I10 Essential (primary) hypertension: Secondary | ICD-10-CM | POA: Diagnosis not present

## 2023-10-31 NOTE — Patient Instructions (Signed)
Medication Instructions:  ?Your physician recommends that you continue on your current medications as directed. Please refer to the Current Medication list given to you today.  ?*If you need a refill on your cardiac medications before your next appointment, please call your pharmacy* ? ? ?Lab Work: ?None ?If you have labs (blood work) drawn today and your tests are completely normal, you will receive your results only by: ?MyChart Message (if you have MyChart) OR ?A paper copy in the mail ?If you have any lab test that is abnormal or we need to change your treatment, we will call you to review the results. ? ? ?Testing/Procedures: ?None ? ? ?Follow-Up: ?At [facility], you and your health needs are our priority.  As part of our continuing mission to provide you with exceptional heart care, we have created designated Provider Care Teams.  These Care Teams include your primary Cardiologist (physician) and Advanced Practice Providers (APPs -  Physician Assistants and Nurse Practitioners) who all work together to provide you with the care you need, when you need it. ? ?We recommend signing up for the patient portal called "MyChart".  Sign up information is provided on this After Visit Summary.  MyChart is used to connect with patients for Virtual Visits (Telemedicine).  Patients are able to view lab/test results, encounter notes, upcoming appointments, etc.  Non-urgent messages can be sent to your provider as well.   ?To learn more about what you can do with MyChart, go to [email or web address].   ? ?Your next appointment:   ?1 year(s) ? ?The format for your next appointment:   ?In Person ? ?Provider:   ?Evalina Field, MD  ? ? ?Other Instructions ?  ?

## 2023-11-04 DIAGNOSIS — I739 Peripheral vascular disease, unspecified: Secondary | ICD-10-CM | POA: Diagnosis not present

## 2023-11-04 DIAGNOSIS — L84 Corns and callosities: Secondary | ICD-10-CM | POA: Diagnosis not present

## 2023-11-04 DIAGNOSIS — L603 Nail dystrophy: Secondary | ICD-10-CM | POA: Diagnosis not present

## 2023-12-19 DIAGNOSIS — I2510 Atherosclerotic heart disease of native coronary artery without angina pectoris: Secondary | ICD-10-CM | POA: Diagnosis not present

## 2023-12-19 DIAGNOSIS — Z789 Other specified health status: Secondary | ICD-10-CM | POA: Diagnosis not present

## 2023-12-19 DIAGNOSIS — K5190 Ulcerative colitis, unspecified, without complications: Secondary | ICD-10-CM | POA: Diagnosis not present

## 2023-12-19 DIAGNOSIS — D649 Anemia, unspecified: Secondary | ICD-10-CM | POA: Diagnosis not present

## 2023-12-19 DIAGNOSIS — J449 Chronic obstructive pulmonary disease, unspecified: Secondary | ICD-10-CM | POA: Diagnosis not present

## 2023-12-19 DIAGNOSIS — I10 Essential (primary) hypertension: Secondary | ICD-10-CM | POA: Diagnosis not present

## 2023-12-19 DIAGNOSIS — R600 Localized edema: Secondary | ICD-10-CM | POA: Diagnosis not present

## 2023-12-19 DIAGNOSIS — Z79899 Other long term (current) drug therapy: Secondary | ICD-10-CM | POA: Diagnosis not present

## 2023-12-19 DIAGNOSIS — I714 Abdominal aortic aneurysm, without rupture, unspecified: Secondary | ICD-10-CM | POA: Diagnosis not present

## 2023-12-26 ENCOUNTER — Ambulatory Visit: Payer: Medicare Other | Attending: Surgery | Admitting: Physician Assistant

## 2023-12-26 ENCOUNTER — Ambulatory Visit (HOSPITAL_COMMUNITY)
Admission: RE | Admit: 2023-12-26 | Discharge: 2023-12-26 | Disposition: A | Discharge status: Home / Self Care | Payer: Medicare Other | Source: Ambulatory Visit | Attending: Surgery | Admitting: Surgery

## 2023-12-26 VITALS — BP 181/90 | HR 65 | Temp 98.3°F | Wt 195.6 lb

## 2023-12-26 DIAGNOSIS — I7143 Infrarenal abdominal aortic aneurysm, without rupture: Secondary | ICD-10-CM

## 2023-12-26 NOTE — Progress Notes (Signed)
 HISTORY AND PHYSICAL     CC:  follow up. For EVAR Requesting Provider:  Benedetta Bradley, *  HPI: This is a 83 y.o. male who is here today for follow up for AAA and is s/p EVAR on 05/11/2023 by Dr. Charlotte Cookey for 5.3cm AAA.  Pt was last seen 06/27/2023 and at that time, pt was doing well without complaints.   The pt returns today for follow up studies.  He states he has been doing well.  He denies any pain in his feet at night.  He does have some cramps in his thighs with sitting long periods and this gets better with getting up and moving.  He is unable to take statins.    The pt is not on a statin for cholesterol management.   allergy  The pt is on an aspirin .    Other AC:  none The pt is on ACEI, diuretic for hypertension.  The pt is not on medication for diabetes. Tobacco hx:  former  Pt does not have family hx of AAA.  Past Medical History:  Diagnosis Date   AAA (abdominal aortic aneurysm) (HCC)    4.2 cm by CTA 10/01/13- states "much smaller than first expected"Dr. Manfred Seed follows-checks every 2 yrs   Allergic asthma    Allergic rhinitis    Anxiety depression   Arthritis    torn rotator cuff left shoulder   Bruise    left chest area"recent fall"-no rib fracture identified.   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   Closed left hip fracture, initial encounter (HCC) 10/01/2019   Colitis    GERD (gastroesophageal reflux disease)    History of measles    History of mumps    HLD (hyperlipidemia)    HTN (hypertension)    Obesity    Polio    left leg   Pupil irregularity, right    old injury"pupil will not contract to light"   Shortness of breath    exertion, asthma attack    Past Surgical History:  Procedure Laterality Date   ABDOMINAL AORTIC ENDOVASCULAR STENT GRAFT Bilateral 05/11/2023   Procedure: ABDOMINAL AORTIC ENDOVASCULAR STENT GRAFT;  Surgeon: Margherita Shell, MD;  Location: [facility] OR;  Service: Vascular;  Laterality: Bilateral;   CARDIAC  CATHETERIZATION     2006- 1 week prior tp CABPG   CATARACT EXTRACTION     COLONOSCOPY WITH PROPOFOL  N/A 04/27/2016   Procedure: COLONOSCOPY WITH PROPOFOL ;  Surgeon: Garrett Kallman, MD;  Location: [facility] ENDOSCOPY;  Service: Endoscopy;  Laterality: N/A;   CORONARY ARTERY BYPASS GRAFT  3/06   x4 vessel bypass [facility]-No problems since.   EYE SURGERY     rebuild eye socket (R)   HIP PINNING,CANNULATED Left 10/01/2019   HIP PINNING,CANNULATED Left 10/01/2019   Procedure: CANNULATED HIP PINNING;  Surgeon: Laneta Pintos, MD;  Location: [facility] OR;  Service: Orthopedics;  Laterality: Left;   KNEE ARTHROSCOPY Left    left knee scope   LUMBAR DISC SURGERY  2011   LUMBAR LAMINECTOMY/DECOMPRESSION MICRODISCECTOMY Left 11/07/2013   Procedure: LUMBAR LAMINECTOMY/DECOMPRESSION MICRODISCECTOMY 1 LEVEL lumbar two/three;  Surgeon: Isadora Mar, MD;  Location: [facility] NEURO ORS;  Service: Neurosurgery;  Laterality: Left;   NASAL SINUS SURGERY     SHOULDER SURGERY     ULTRASOUND GUIDANCE FOR VASCULAR ACCESS Bilateral 05/11/2023   Procedure: ULTRASOUND GUIDANCE FOR VASCULAR ACCESS;  Surgeon: Margherita Shell, MD;  Location: [facility] OR;  Service: Vascular;  Laterality: Bilateral;  Allergies  Allergen Reactions   Advair Hfa [Fluticasone -Salmeterol] Hives   Aspirin  Hives    Can take 81 mg   Codeine Hives     whelps, Hydrocodone- ok, no problemHy   Hydrochlorothiazide Rash   Ibuprofen Rash   Keflex [Cephalexin] Rash   Latex Rash   Montelukast Sodium Rash   Penicillins Rash   Statins Rash   Sulfonamide Derivatives Rash   Tape Rash    Other Reaction(s): rash and blisters    Current Outpatient Medications  Medication Sig Dispense Refill   aspirin  EC 81 MG tablet Take 81 mg by mouth daily.     budesonide-formoterol  (SYMBICORT) 160-4.5 MCG/ACT inhaler Inhale 2 puffs into the lungs 2 (two) times daily.     escitalopram (LEXAPRO) 10 MG tablet Take 10 mg by mouth daily.     ezetimibe  (ZETIA ) 10 MG tablet TAKE 1 TABLET BY  MOUTH EVERY DAY 90 tablet 3   fexofenadine (ALLEGRA) 180 MG tablet Take 180 mg by mouth daily.     furosemide (LASIX) 20 MG tablet Take 20 mg by mouth daily as needed for fluid.     lisinopril  (ZESTRIL ) 30 MG tablet Take 30 mg by mouth daily.     loperamide  (IMODIUM ) 2 MG capsule Take 1 capsule (2 mg total) by mouth every 4 (four) hours as needed for diarrhea or loose stools. 30 capsule 0   oxyCODONE  (OXY IR/ROXICODONE ) 5 MG immediate release tablet Take 1 tablet (5 mg total) by mouth every 6 (six) hours as needed for severe pain. 15 tablet 0   PROAIR  HFA 108 (90 Base) MCG/ACT inhaler Inhale 2 puffs into the lungs every 4 (four) hours as needed for wheezing.      No current facility-administered medications for this visit.    Family History  Problem Relation Age of Onset   Alzheimer's disease Mother    Liver disease Sister    Lung cancer Brother     Social History   Socioeconomic History   Marital status: Widowed    Spouse name: Not on file   Number of children: 0   Years of education: Not on file   Highest education level: Not on file  Occupational History   Occupation: shuttle driver    Employer: Oceanographer    Comment: [employer]  Tobacco Use   Smoking status: Former    Current packs/day: 0.00    Average packs/day: 1 pack/day for 37.0 years (37.0 ttl pk-yrs)    Types: Cigarettes    Start date: 08/24/1955    Quit date: 08/23/1992    Years since quitting: 31.3   Smokeless tobacco: Never  Vaping Use   Vaping status: Never Used  Substance and Sexual Activity   Alcohol use: Not Currently    Comment: socially    Drug use: No   Sexual activity: Not on file  Other Topics Concern   Not on file  Social History Narrative   Not on file   Social Drivers of Health   Financial Resource Strain: Not on file  Food Insecurity: Patient Declined (04/08/2023)   Hunger Vital Sign    Worried About Running Out of Food in the Last Year: Patient declined    Ran Out of Food in the Last  Year: Patient declined  Transportation Needs: No Transportation Needs (04/08/2023)   PRAPARE - Administrator, Civil Service (Medical): No    Lack of Transportation (Non-Medical): No  Physical Activity: Not on file  Stress: Not on file  Social Connections: Not on file  Intimate Partner Violence: Not At Risk (04/08/2023)   Humiliation, Afraid, Rape, and Kick questionnaire    Fear of Current or Ex-Partner: No    Emotionally Abused: No    Physically Abused: No    Sexually Abused: No     REVIEW OF SYSTEMS:   [X]  denotes positive finding, [ ]  denotes negative finding Cardiac  Comments:  Chest pain or chest pressure:    Shortness of breath upon exertion:    Short of breath when lying flat:    Irregular heart rhythm:        Vascular    Pain in calf, thigh, or hip brought on by ambulation:    Pain in feet at night that wakes you up from your sleep:     Blood clot in your veins:    Leg swelling:         Pulmonary    Oxygen at home:    Productive cough:     Wheezing:         Neurologic    Sudden weakness in arms or legs:     Sudden numbness in arms or legs:     Sudden onset of difficulty speaking or slurred speech:    Temporary loss of vision in one eye:     Problems with dizziness:         Gastrointestinal    Blood in stool:     Vomited blood:         Genitourinary    Burning when urinating:     Blood in urine:        Psychiatric    Major depression:         Hematologic    Bleeding problems:    Problems with blood clotting too easily:        Skin    Rashes or ulcers:        Constitutional    Fever or chills:      PHYSICAL EXAMINATION:  Today's Vitals   12/26/23 0858  BP: (!) 181/90  Pulse: 65  Temp: 98.3 F (36.8 C)  TempSrc: Temporal  SpO2: 93%  Weight: 195 lb 9.6 oz (88.7 kg)  PainSc: 0-No pain   Body mass index is 30.64 kg/m.   General:  WDWN in NAD; vital signs documented above Gait: Not observed HENT: WNL,  normocephalic Pulmonary: normal non-labored breathing  Cardiac: regular HR;  without carotid bruits Abdomen: soft, NT; aortic pulse is not palpable Skin: without rashes Vascular Exam/Pulses:  Right Left  Radial 2+ (normal) 2+ (normal)  Femoral 2+ (normal) 2+ (normal)  DP 1+ (weak) 1+ (weak)   Extremities: without open wounds Musculoskeletal: no muscle wasting or atrophy  Neurologic: A&O X 3 Psychiatric:  The pt has Normal affect.   Non-Invasive Vascular Imaging:   EVAR Aterial duplex on 12/26/2023: Abdominal Aorta Findings:  +--------+-------+----------+----------+--------+--------+--------+  LocationAP (cm)Trans (cm)PSV (cm/s)WaveformThrombusComments  +--------+-------+----------+----------+--------+--------+--------+  Proximal                58                                  +--------+-------+----------+----------+--------+--------+--------+   Endovascular Aortic Repair (EVAR):  +----------+----------------+-------------------+-------------------+           Diameter AP (cm)Diameter Trans (cm)Velocities (cm/sec)  +----------+----------------+-------------------+-------------------+  Aorta    4.73            4.75  53                   +----------+----------------+-------------------+-------------------+  Right Limb1.62            1.92               66                   +----------+----------------+-------------------+-------------------+  Left Limb 1.43            1.66               77                   +----------+----------------+-------------------+-------------------+   Summary:  Abdominal Aorta: Patent endovascular aneurysm repair with no evidence of  endoleak. Previous diameter measurement was obtained on CTA 06/15/23: 5.1 x 4.8 cm.    ASSESSMENT/PLAN:: 83 y.o. male here with hx of  EVAR on 05/11/2023 by Dr. Charlotte Cookey for 5.3cm AAA.  -pt doing well without new abdominal or back pain.  He has 1+ palpable DP pulses  bilaterally.  U/s reveals aneurysm sac has decreased in size without endoleak.   -continue asa.  He is not on a statin due to allergy . -pt will f/u in one year with EVAR duplex.   Maryanna Smart, [facility] [phone]  Clinic MD:   Charlotte Cookey

## 2023-12-29 DIAGNOSIS — I872 Venous insufficiency (chronic) (peripheral): Secondary | ICD-10-CM | POA: Diagnosis not present

## 2024-01-09 DIAGNOSIS — I10 Essential (primary) hypertension: Secondary | ICD-10-CM | POA: Diagnosis not present

## 2024-01-09 DIAGNOSIS — R748 Abnormal levels of other serum enzymes: Secondary | ICD-10-CM | POA: Diagnosis not present

## 2024-01-09 DIAGNOSIS — J449 Chronic obstructive pulmonary disease, unspecified: Secondary | ICD-10-CM | POA: Diagnosis not present

## 2024-01-24 DIAGNOSIS — I10 Essential (primary) hypertension: Secondary | ICD-10-CM | POA: Diagnosis not present

## 2024-01-24 DIAGNOSIS — J449 Chronic obstructive pulmonary disease, unspecified: Secondary | ICD-10-CM | POA: Diagnosis not present

## 2024-01-24 DIAGNOSIS — R748 Abnormal levels of other serum enzymes: Secondary | ICD-10-CM | POA: Diagnosis not present

## 2024-02-02 ENCOUNTER — Ambulatory Visit: Payer: Self-pay | Admitting: Podiatry

## 2024-02-02 DIAGNOSIS — M79671 Pain in right foot: Secondary | ICD-10-CM | POA: Diagnosis not present

## 2024-02-02 DIAGNOSIS — M79672 Pain in left foot: Secondary | ICD-10-CM | POA: Diagnosis not present

## 2024-02-02 DIAGNOSIS — B351 Tinea unguium: Secondary | ICD-10-CM | POA: Diagnosis not present

## 2024-02-02 NOTE — Progress Notes (Signed)
 Patient presents for evaluation and treatment of tenderness and some redness around nails feet.  Tenderness around toes with walking and wearing shoes.  Physical exam:  General appearance: Alert, pleasant, and in no acute distress.  Vascular: Pedal pulses: DP 2/4 B/L, PT 0/4 B/L. Mild edema lower legs bilaterally  Neurological:  No paresthesias or burning  Dermatologic:  Nails thickened, disfigured, discolored 1-5 BL with subungual debris.  Redness and hypertrophic nail folds along nail folds bilaterally but no signs of drainage or infection.  Musculoskeletal:     Diagnosis: 1. Painful onychomycotic nails 1 through 5 bilaterally. 2. Pain toes 1 through 5 bilaterally.  Plan: Debrided onychomycotic nails 1 through 5 bilaterally.  Return 3 months

## 2024-02-20 DIAGNOSIS — I10 Essential (primary) hypertension: Secondary | ICD-10-CM | POA: Diagnosis not present

## 2024-02-20 DIAGNOSIS — E7800 Pure hypercholesterolemia, unspecified: Secondary | ICD-10-CM | POA: Diagnosis not present

## 2024-02-20 DIAGNOSIS — J45998 Other asthma: Secondary | ICD-10-CM | POA: Diagnosis not present

## 2024-03-13 NOTE — Progress Notes (Signed)
 This encounter was created in error - please disregard.

## 2024-03-21 DIAGNOSIS — I10 Essential (primary) hypertension: Secondary | ICD-10-CM | POA: Diagnosis not present

## 2024-03-21 DIAGNOSIS — R1031 Right lower quadrant pain: Secondary | ICD-10-CM | POA: Diagnosis not present

## 2024-03-22 DIAGNOSIS — J45998 Other asthma: Secondary | ICD-10-CM | POA: Diagnosis not present

## 2024-03-22 DIAGNOSIS — E7800 Pure hypercholesterolemia, unspecified: Secondary | ICD-10-CM | POA: Diagnosis not present

## 2024-03-22 DIAGNOSIS — I10 Essential (primary) hypertension: Secondary | ICD-10-CM | POA: Diagnosis not present

## 2024-04-04 DIAGNOSIS — R0982 Postnasal drip: Secondary | ICD-10-CM | POA: Diagnosis not present

## 2024-04-04 DIAGNOSIS — J302 Other seasonal allergic rhinitis: Secondary | ICD-10-CM | POA: Diagnosis not present

## 2024-04-18 DIAGNOSIS — K219 Gastro-esophageal reflux disease without esophagitis: Secondary | ICD-10-CM | POA: Diagnosis not present

## 2024-04-18 DIAGNOSIS — R0982 Postnasal drip: Secondary | ICD-10-CM | POA: Diagnosis not present

## 2024-04-18 DIAGNOSIS — J302 Other seasonal allergic rhinitis: Secondary | ICD-10-CM | POA: Diagnosis not present

## 2024-04-22 DIAGNOSIS — E7800 Pure hypercholesterolemia, unspecified: Secondary | ICD-10-CM | POA: Diagnosis not present

## 2024-04-22 DIAGNOSIS — I10 Essential (primary) hypertension: Secondary | ICD-10-CM | POA: Diagnosis not present

## 2024-04-22 DIAGNOSIS — J45998 Other asthma: Secondary | ICD-10-CM | POA: Diagnosis not present

## 2024-05-03 ENCOUNTER — Ambulatory Visit: Admitting: Podiatry

## 2024-05-03 DIAGNOSIS — B351 Tinea unguium: Secondary | ICD-10-CM | POA: Diagnosis not present

## 2024-05-03 DIAGNOSIS — M79672 Pain in left foot: Secondary | ICD-10-CM

## 2024-05-03 DIAGNOSIS — M79671 Pain in right foot: Secondary | ICD-10-CM

## 2024-05-03 NOTE — Progress Notes (Signed)
 Patient presents for evaluation and treatment of tenderness and some redness around nails feet.  Tenderness around toes with walking and wearing shoes.  Physical exam:  General appearance: Alert, pleasant, and in no acute distress.  Vascular: Pedal pulses: DP 2/4 B/L, PT 0/4 B/L. Mild edema lower legs bilaterally  Neu  Dermatologic:  Nails thickened, disfigured, discolored 1-5 BL with subungual debris.  Redness and hypertrophic nail folds along nail folds bilaterally but no signs of drainage or infection.  Musculoskeletal:     Diagnosis: 1. Painful onychomycotic nails 1 through 5 bilaterally. 2. Pain toes 1 through 5 bilaterally.  Plan: -Debrided onychomycotic nails 1 through 5 bilaterally.  Sharply debrided nails with nail clipper and reduced with a power bur.  Return 3 months RFC

## 2024-05-22 DIAGNOSIS — I10 Essential (primary) hypertension: Secondary | ICD-10-CM | POA: Diagnosis not present

## 2024-05-22 DIAGNOSIS — E7800 Pure hypercholesterolemia, unspecified: Secondary | ICD-10-CM | POA: Diagnosis not present

## 2024-05-22 DIAGNOSIS — J45998 Other asthma: Secondary | ICD-10-CM | POA: Diagnosis not present

## 2024-05-29 ENCOUNTER — Emergency Department (HOSPITAL_BASED_OUTPATIENT_CLINIC_OR_DEPARTMENT_OTHER)

## 2024-05-29 ENCOUNTER — Other Ambulatory Visit: Payer: Self-pay

## 2024-05-29 ENCOUNTER — Encounter (HOSPITAL_BASED_OUTPATIENT_CLINIC_OR_DEPARTMENT_OTHER): Payer: Self-pay | Admitting: Emergency Medicine

## 2024-05-29 ENCOUNTER — Emergency Department (HOSPITAL_BASED_OUTPATIENT_CLINIC_OR_DEPARTMENT_OTHER)
Admission: EM | Admit: 2024-05-29 | Discharge: 2024-05-29 | Disposition: A | Discharge status: Home / Self Care | Source: Ambulatory Visit | Attending: Emergency Medicine | Admitting: Emergency Medicine

## 2024-05-29 DIAGNOSIS — Z79899 Other long term (current) drug therapy: Secondary | ICD-10-CM | POA: Insufficient documentation

## 2024-05-29 DIAGNOSIS — T148XXA Other injury of unspecified body region, initial encounter: Secondary | ICD-10-CM

## 2024-05-29 DIAGNOSIS — Z91040 Latex allergy status: Secondary | ICD-10-CM | POA: Diagnosis not present

## 2024-05-29 DIAGNOSIS — Z7982 Long term (current) use of aspirin: Secondary | ICD-10-CM | POA: Diagnosis not present

## 2024-05-29 DIAGNOSIS — M4802 Spinal stenosis, cervical region: Secondary | ICD-10-CM | POA: Diagnosis not present

## 2024-05-29 DIAGNOSIS — S0081XA Abrasion of other part of head, initial encounter: Secondary | ICD-10-CM | POA: Diagnosis not present

## 2024-05-29 DIAGNOSIS — Z7951 Long term (current) use of inhaled steroids: Secondary | ICD-10-CM | POA: Insufficient documentation

## 2024-05-29 DIAGNOSIS — I10 Essential (primary) hypertension: Secondary | ICD-10-CM | POA: Insufficient documentation

## 2024-05-29 DIAGNOSIS — S0990XA Unspecified injury of head, initial encounter: Secondary | ICD-10-CM | POA: Diagnosis not present

## 2024-05-29 DIAGNOSIS — M5032 Other cervical disc degeneration, mid-cervical region, unspecified level: Secondary | ICD-10-CM | POA: Diagnosis not present

## 2024-05-29 DIAGNOSIS — W1830XA Fall on same level, unspecified, initial encounter: Secondary | ICD-10-CM | POA: Diagnosis not present

## 2024-05-29 DIAGNOSIS — S80212A Abrasion, left knee, initial encounter: Secondary | ICD-10-CM | POA: Diagnosis not present

## 2024-05-29 DIAGNOSIS — W19XXXA Unspecified fall, initial encounter: Secondary | ICD-10-CM

## 2024-05-29 DIAGNOSIS — I2510 Atherosclerotic heart disease of native coronary artery without angina pectoris: Secondary | ICD-10-CM | POA: Diagnosis not present

## 2024-05-29 DIAGNOSIS — M5021 Other cervical disc displacement,  high cervical region: Secondary | ICD-10-CM | POA: Diagnosis not present

## 2024-05-29 DIAGNOSIS — S199XXA Unspecified injury of neck, initial encounter: Secondary | ICD-10-CM | POA: Diagnosis not present

## 2024-05-29 DIAGNOSIS — S0093XA Contusion of unspecified part of head, initial encounter: Secondary | ICD-10-CM | POA: Diagnosis not present

## 2024-05-29 DIAGNOSIS — S0003XA Contusion of scalp, initial encounter: Secondary | ICD-10-CM | POA: Diagnosis not present

## 2024-05-29 DIAGNOSIS — Z951 Presence of aortocoronary bypass graft: Secondary | ICD-10-CM | POA: Insufficient documentation

## 2024-05-29 DIAGNOSIS — J45909 Unspecified asthma, uncomplicated: Secondary | ICD-10-CM | POA: Diagnosis not present

## 2024-05-29 NOTE — ED Triage Notes (Signed)
 Pt via pov from pcp after a fall this afternoon around 2PM. Pt reports he takes a baby aspirin  every day; no prescribed blood thinners. Pt states he caught the wheel of his walker and tripped. Pt has contusion with abrasion on the crown of his head. Denies LOC. Pt a&o x 4; nad noted.

## 2024-05-29 NOTE — ED Notes (Signed)
 Pt d/c instructions, medications, and follow-up care reviewed with pt. Pt verbalized understanding and had no further questions at time of d/c. Pt CA&Ox4, ambulatory, and in NAD at time of d/c

## 2024-05-29 NOTE — ED Provider Notes (Signed)
 [place] [facility] AT [facility] Provider Note   CSN: [id number] Arrival date & time: 05/29/24  1651     Patient presents with: Thomas [name] is a 83 y.o. male.  {Add pertinent medical, surgical, social history, OB history to HPI:32947} HPI     Walker wheel hit crack in concrete and fell and hit back of head No headache, no neck pain No other injuries Occurred is [place]  Just aspirin   Past Medical History:  Diagnosis Date   AAA (abdominal aortic aneurysm)    4.2 cm by CTA 10/01/13- states much smaller than first expectedDr. Signa follows-checks every 2 yrs   Allergic asthma    Allergic rhinitis    Anxiety depression   Arthritis    torn rotator cuff left shoulder   Bruise    left chest arearecent fall-no rib fracture identified.   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   Closed left hip fracture, initial encounter (HCC) 10/01/2019   Colitis    GERD (gastroesophageal reflux disease)    History of measles    History of mumps    HLD (hyperlipidemia)    HTN (hypertension)    Obesity    Polio    left leg   Pupil irregularity, right    old injurypupil will not contract to light   Shortness of breath    exertion, asthma attack     Prior to Admission medications   Medication Sig Start Date End Date Taking? Authorizing Provider  aspirin  EC 81 MG tablet Take 81 mg by mouth daily.    [provider]  budesonide-formoterol  (SYMBICORT) 160-4.5 MCG/ACT inhaler Inhale 2 puffs into the lungs 2 (two) times daily. 11/04/21   [provider]  escitalopram (LEXAPRO) 10 MG tablet Take 10 mg by mouth daily. 06/15/23   [provider]  ezetimibe  (ZETIA ) 10 MG tablet TAKE 1 TABLET BY MOUTH EVERY DAY 07/13/21   O'Neal, Darryle Ned, MD  fexofenadine (ALLEGRA) 180 MG tablet Take 180 mg by mouth daily.    [provider]  furosemide (LASIX) 20 MG tablet Take 20 mg by mouth daily as needed for fluid.  06/17/22   [provider]  lisinopril  (ZESTRIL ) 40 MG tablet 1 tablet Orally Once a day for 90 days    [provider]  loperamide  (IMODIUM ) 2 MG capsule Take 1 capsule (2 mg total) by mouth every 4 (four) hours as needed for diarrhea or loose stools. 04/11/23   Caleen Qualia, MD  oxyCODONE  (OXY IR/ROXICODONE ) 5 MG immediate release tablet Take 1 tablet (5 mg total) by mouth every 6 (six) hours as needed for severe pain. 05/12/23   Bethanie Cough, PA-C  PROAIR  HFA 108 (90 Base) MCG/ACT inhaler Inhale 2 puffs into the lungs every 4 (four) hours as needed for wheezing.  09/19/19   [provider]    Allergies: Advair hfa [fluticasone -salmeterol], Aspirin , Codeine, Hydrochlorothiazide, Ibuprofen, Keflex [cephalexin], Latex, Montelukast sodium, Penicillins, Statins, Sulfonamide derivatives, and Tape    Review of Systems  Updated Vital Signs BP (!) 208/92 (BP Location: Left Arm)   Pulse 71   Temp (!) 97.5 F (36.4 C) (Oral)   Resp 20   Ht 5' 7 (1.702 m)   Wt 88.7 kg   SpO2 96%   BMI 30.63 kg/m   Physical Exam  (all labs ordered are listed, but only abnormal results are displayed) Labs Reviewed - No data to display  EKG: None  Radiology:  CT Cervical Spine Wo Contrast Result Date: 05/29/2024 EXAM: CT CERVICAL SPINE WITHOUT CONTRAST 05/29/2024 08:01:33 PM TECHNIQUE: CT of the cervical spine was performed without the administration of intravenous contrast. Multiplanar reformatted images are provided for review. Automated exposure control, iterative reconstruction, and/or weight based adjustment of the mA/kV was utilized to reduce the radiation dose to as low as reasonably achievable. COMPARISON: None available. CLINICAL HISTORY: Neck trauma (Age >= 65y). Patient via POV from PCP after a fall this afternoon around 2PM. Patient reports he takes a baby aspirin  every day; no prescribed blood thinners. Patient states he caught the wheel of his walker and tripped.  Patient has contusion with abrasion on the crown of his head. Denies LOC. Patient A\T\O x 4; NAD noted. FINDINGS: CERVICAL SPINE: BONES AND ALIGNMENT: There is a 2 mm retrolisthesis at C3-4, likely degenerative in nature. Otherwise, the cervical alignment is normal. Craniocervical alignment is normal. The atlantodental interval is not widened. DEGENERATIVE CHANGES: Intervertebral disc space narrowing and endplate remodeling is seen at C3 - C6, giving changes of severe degenerative disc disease. Uncovertebral arthrosis results in severe bilateral neural foraminal narrowing at C3-4 and C4-5. Degenerative ankylosis of the left facets of C5-6. SOFT TISSUES: No prevertebral soft tissue swelling. A posterior disc osteophyte complex in combination with retrolisthesis at C3-4 results in mild-to-moderate central canal stenosis with flattening of the thecal sac. The spinal canal is otherwise widely patent. IMPRESSION: 1. No acute abnormality of the cervical spine related to the reported neck trauma. 2. 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 Electronically signed by: Dorethia Molt MD 05/29/2024 08:23 PM EDT RP Workstation: [id number]   CT Head Wo Contrast Result Date: 05/29/2024 EXAM: CT HEAD WITHOUT CONTRAST 05/29/2024 08:01:33 PM TECHNIQUE: CT of the head was performed without the administration of intravenous contrast. Automated exposure control, iterative reconstruction, and/or weight based adjustment of the mA/kV was utilized to reduce the radiation dose to as low as reasonably achievable. COMPARISON: None available. CLINICAL HISTORY: Head trauma, minor (Age >= 65y). Patient via POV from PCP after a fall this afternoon around 2PM. Patient reports he takes a baby aspirin  every day; no prescribed blood thinners. Patient states he caught  the wheel of his walker and tripped. Patient has contusion with abrasion on the crown of his head. Denies LOC. Patient A\T\O x 4; NAD noted. FINDINGS: BRAIN AND VENTRICLES: Parenchymal volume loss is commensurate with the patient's age. Moderate Periventricular white matter changes are present likely reflecting the sequela of small vessel ischemia. No acute hemorrhage. No evidence of acute infarct. No hydrocephalus. No extra-axial collection. No mass effect or midline shift. ORBITS: Right orbital floor reconstruction has been performed. No acute abnormality. SINUSES: Bilateral ethmoidectomy, middle turbinate, and maxillary antrostomy has been performed. Mild mucosal thickening within the left maxillary sinus. No air-fluid levels. No acute abnormality. SOFT TISSUES AND SKULL: No acute soft tissue abnormality. No skull fracture. IMPRESSION: 1. No acute intracranial abnormality related to the reported head trauma. 2. Moderate periventricular white matter changes, likely reflecting the sequela of small vessel ischemia. Electronically signed by: Dorethia Molt MD 05/29/2024 08:17 PM EDT RP Workstation: [id number]    {Document cardiac monitor, telemetry assessment procedure when appropriate:32947} Procedures   Medications Ordered in the ED - No data to display    {Click here for ABCD2, HEART and other calculators REFRESH Note before signing:1}  Medical Decision Making  ***  {Document critical care time when appropriate  Document review of labs and clinical decision tools ie [id number], etc  Document your independent review of radiology images and any outside records  Document your discussion with family members, caretakers and with consultants  Document social determinants of health affecting pt's care  Document your decision making why or why not admission, treatments were needed:32947:::1}   Final diagnoses:  None    ED Discharge Orders     None

## 2024-05-30 DIAGNOSIS — S0003XA Contusion of scalp, initial encounter: Secondary | ICD-10-CM | POA: Diagnosis not present

## 2024-05-30 DIAGNOSIS — S0990XA Unspecified injury of head, initial encounter: Secondary | ICD-10-CM | POA: Diagnosis not present

## 2024-05-30 DIAGNOSIS — W19XXXA Unspecified fall, initial encounter: Secondary | ICD-10-CM | POA: Diagnosis not present

## 2024-06-04 DIAGNOSIS — S0990XD Unspecified injury of head, subsequent encounter: Secondary | ICD-10-CM | POA: Diagnosis not present

## 2024-06-04 DIAGNOSIS — Z23 Encounter for immunization: Secondary | ICD-10-CM | POA: Diagnosis not present

## 2024-06-04 DIAGNOSIS — I10 Essential (primary) hypertension: Secondary | ICD-10-CM | POA: Diagnosis not present

## 2024-06-04 DIAGNOSIS — M25461 Effusion, right knee: Secondary | ICD-10-CM | POA: Diagnosis not present

## 2024-06-04 DIAGNOSIS — W19XXXD Unspecified fall, subsequent encounter: Secondary | ICD-10-CM | POA: Diagnosis not present

## 2024-06-04 DIAGNOSIS — R1031 Right lower quadrant pain: Secondary | ICD-10-CM | POA: Diagnosis not present

## 2024-06-07 DIAGNOSIS — R1031 Right lower quadrant pain: Secondary | ICD-10-CM | POA: Diagnosis not present

## 2024-06-12 DIAGNOSIS — R2241 Localized swelling, mass and lump, right lower limb: Secondary | ICD-10-CM | POA: Diagnosis not present

## 2024-06-20 DIAGNOSIS — Z79899 Other long term (current) drug therapy: Secondary | ICD-10-CM | POA: Diagnosis not present

## 2024-06-20 DIAGNOSIS — I10 Essential (primary) hypertension: Secondary | ICD-10-CM | POA: Diagnosis not present

## 2024-06-20 DIAGNOSIS — E7800 Pure hypercholesterolemia, unspecified: Secondary | ICD-10-CM | POA: Diagnosis not present

## 2024-08-02 ENCOUNTER — Encounter: Payer: Self-pay | Admitting: Podiatry

## 2024-08-02 ENCOUNTER — Ambulatory Visit: Admitting: Podiatry

## 2024-08-02 DIAGNOSIS — M79672 Pain in left foot: Secondary | ICD-10-CM | POA: Diagnosis not present

## 2024-08-02 DIAGNOSIS — M79671 Pain in right foot: Secondary | ICD-10-CM

## 2024-08-02 DIAGNOSIS — B351 Tinea unguium: Secondary | ICD-10-CM | POA: Diagnosis not present

## 2024-08-02 NOTE — Progress Notes (Signed)
 Patient presents for evaluation and treatment of tenderness and some redness around nails feet.  Tenderness around toes with walking and wearing shoes.  Physical exam:  General appearance: Alert, pleasant, and in no acute distress.  Vascular: Pedal pulses: DP 2/4 B/L, PT 0/4 B/L. Mild edema lower legs bilaterally  Neu  Dermatologic:  Nails thickened, disfigured, discolored 1-5 BL with subungual debris.  Redness and hypertrophic nail folds along nail folds bilaterally but no signs of drainage or infection.  Musculoskeletal:     Diagnosis: 1. Painful onychomycotic nails 1 through 5 bilaterally. 2. Pain toes 1 through 5 bilaterally.  Plan: -Debrided onychomycotic nails 1 through 5 bilaterally.  Sharply debrided nails with nail clipper and reduced with a power bur.  Return 3 months RFC

## 2024-10-31 ENCOUNTER — Ambulatory Visit: Admitting: Podiatry
